# Patient Record
Sex: Female | Born: 1935 | Race: White | Hispanic: No | Marital: Married | Smoking: Current every day smoker
Health system: Southern US, Community
[De-identification: ages and names within clinical notes are randomized; demographics above are authoritative.]

## PROBLEM LIST (undated history)

## (undated) DIAGNOSIS — I639 Cerebral infarction, unspecified: Secondary | ICD-10-CM

## (undated) DIAGNOSIS — E785 Hyperlipidemia, unspecified: Secondary | ICD-10-CM

## (undated) DIAGNOSIS — R55 Syncope and collapse: Secondary | ICD-10-CM

## (undated) DIAGNOSIS — C801 Malignant (primary) neoplasm, unspecified: Secondary | ICD-10-CM

## (undated) DIAGNOSIS — Z720 Tobacco use: Secondary | ICD-10-CM

## (undated) DIAGNOSIS — Z7901 Long term (current) use of anticoagulants: Secondary | ICD-10-CM

## (undated) DIAGNOSIS — R079 Chest pain, unspecified: Secondary | ICD-10-CM

## (undated) DIAGNOSIS — I6529 Occlusion and stenosis of unspecified carotid artery: Secondary | ICD-10-CM

## (undated) DIAGNOSIS — G629 Polyneuropathy, unspecified: Secondary | ICD-10-CM

## (undated) DIAGNOSIS — I951 Orthostatic hypotension: Secondary | ICD-10-CM

## (undated) DIAGNOSIS — K219 Gastro-esophageal reflux disease without esophagitis: Secondary | ICD-10-CM

## (undated) DIAGNOSIS — I2510 Atherosclerotic heart disease of native coronary artery without angina pectoris: Secondary | ICD-10-CM

## (undated) DIAGNOSIS — I517 Cardiomegaly: Secondary | ICD-10-CM

## (undated) DIAGNOSIS — I10 Essential (primary) hypertension: Secondary | ICD-10-CM

## (undated) DIAGNOSIS — I4891 Unspecified atrial fibrillation: Secondary | ICD-10-CM

## (undated) DIAGNOSIS — I672 Cerebral atherosclerosis: Secondary | ICD-10-CM

## (undated) HISTORY — DX: Syncope and collapse: R55

## (undated) HISTORY — DX: Cerebral atherosclerosis: I67.2

## (undated) HISTORY — DX: Hyperlipidemia, unspecified: E78.5

## (undated) HISTORY — DX: Atherosclerotic heart disease of native coronary artery without angina pectoris: I25.10

## (undated) HISTORY — DX: Chest pain, unspecified: R07.9

## (undated) HISTORY — PX: TONSILLECTOMY: SUR1361

## (undated) HISTORY — PX: SKIN CANCER EXCISION: SHX779

## (undated) HISTORY — PX: CHOLECYSTECTOMY: SHX55

## (undated) HISTORY — PX: TEE WITH CARDIOVERSION: SHX5442

---

## 2003-09-14 ENCOUNTER — Encounter: Payer: Self-pay | Admitting: Orthopedic Surgery

## 2003-12-07 ENCOUNTER — Encounter: Payer: Self-pay | Admitting: Orthopedic Surgery

## 2004-06-10 ENCOUNTER — Emergency Department (HOSPITAL_COMMUNITY): Admission: EM | Admit: 2004-06-10 | Discharge: 2004-06-10 | Payer: Self-pay | Admitting: Emergency Medicine

## 2004-06-18 ENCOUNTER — Emergency Department (HOSPITAL_COMMUNITY): Admission: EM | Admit: 2004-06-18 | Discharge: 2004-06-18 | Payer: Self-pay | Admitting: Emergency Medicine

## 2005-06-22 ENCOUNTER — Ambulatory Visit (HOSPITAL_COMMUNITY): Admission: RE | Admit: 2005-06-22 | Discharge: 2005-06-22 | Payer: Self-pay | Admitting: Family Medicine

## 2005-07-12 ENCOUNTER — Ambulatory Visit (HOSPITAL_COMMUNITY): Admission: RE | Admit: 2005-07-12 | Discharge: 2005-07-12 | Payer: Self-pay | Admitting: Family Medicine

## 2005-07-21 ENCOUNTER — Inpatient Hospital Stay (HOSPITAL_COMMUNITY): Admission: EM | Admit: 2005-07-21 | Discharge: 2005-07-22 | Payer: Self-pay | Admitting: Emergency Medicine

## 2006-06-19 ENCOUNTER — Inpatient Hospital Stay (HOSPITAL_COMMUNITY): Admission: AD | Admit: 2006-06-19 | Discharge: 2006-06-25 | Payer: Self-pay | Admitting: Internal Medicine

## 2006-06-20 ENCOUNTER — Ambulatory Visit: Payer: Self-pay | Admitting: Gastroenterology

## 2006-06-21 ENCOUNTER — Ambulatory Visit: Payer: Self-pay | Admitting: Gastroenterology

## 2006-06-21 ENCOUNTER — Encounter (INDEPENDENT_AMBULATORY_CARE_PROVIDER_SITE_OTHER): Payer: Self-pay | Admitting: Specialist

## 2006-07-20 DIAGNOSIS — I2510 Atherosclerotic heart disease of native coronary artery without angina pectoris: Secondary | ICD-10-CM

## 2006-07-20 HISTORY — DX: Atherosclerotic heart disease of native coronary artery without angina pectoris: I25.10

## 2006-07-30 ENCOUNTER — Ambulatory Visit (HOSPITAL_COMMUNITY): Admission: RE | Admit: 2006-07-30 | Discharge: 2006-07-30 | Payer: Self-pay | Admitting: Internal Medicine

## 2006-08-29 ENCOUNTER — Encounter (INDEPENDENT_AMBULATORY_CARE_PROVIDER_SITE_OTHER): Payer: Self-pay | Admitting: Cardiology

## 2006-08-29 ENCOUNTER — Ambulatory Visit (HOSPITAL_COMMUNITY): Admission: RE | Admit: 2006-08-29 | Discharge: 2006-08-29 | Payer: Self-pay | Admitting: Cardiology

## 2007-03-15 ENCOUNTER — Ambulatory Visit (HOSPITAL_COMMUNITY): Admission: RE | Admit: 2007-03-15 | Discharge: 2007-03-15 | Payer: Self-pay | Admitting: Family Medicine

## 2007-08-06 ENCOUNTER — Ambulatory Visit (HOSPITAL_COMMUNITY): Admission: RE | Admit: 2007-08-06 | Discharge: 2007-08-06 | Payer: Self-pay | Admitting: Internal Medicine

## 2007-08-12 ENCOUNTER — Inpatient Hospital Stay (HOSPITAL_COMMUNITY): Admission: EM | Admit: 2007-08-12 | Discharge: 2007-08-16 | Payer: Self-pay | Admitting: Emergency Medicine

## 2007-08-29 ENCOUNTER — Emergency Department (HOSPITAL_COMMUNITY): Admission: EM | Admit: 2007-08-29 | Discharge: 2007-08-29 | Payer: Self-pay | Admitting: Emergency Medicine

## 2008-06-25 ENCOUNTER — Emergency Department (HOSPITAL_COMMUNITY): Admission: EM | Admit: 2008-06-25 | Discharge: 2008-06-25 | Payer: Self-pay | Admitting: Emergency Medicine

## 2008-08-12 ENCOUNTER — Ambulatory Visit (HOSPITAL_COMMUNITY): Admission: RE | Admit: 2008-08-12 | Discharge: 2008-08-12 | Payer: Self-pay | Admitting: Internal Medicine

## 2008-12-03 IMAGING — CR DG CHEST 2V
2 series · 2 of 2 positions shown · non-contrast
Comparison: CT and plain film 07/21/2005.

CLINICAL DATA: Atrial flutter. Chest pain.

CHEST - 2 VIEW

[view not recorded (1 of 2)]
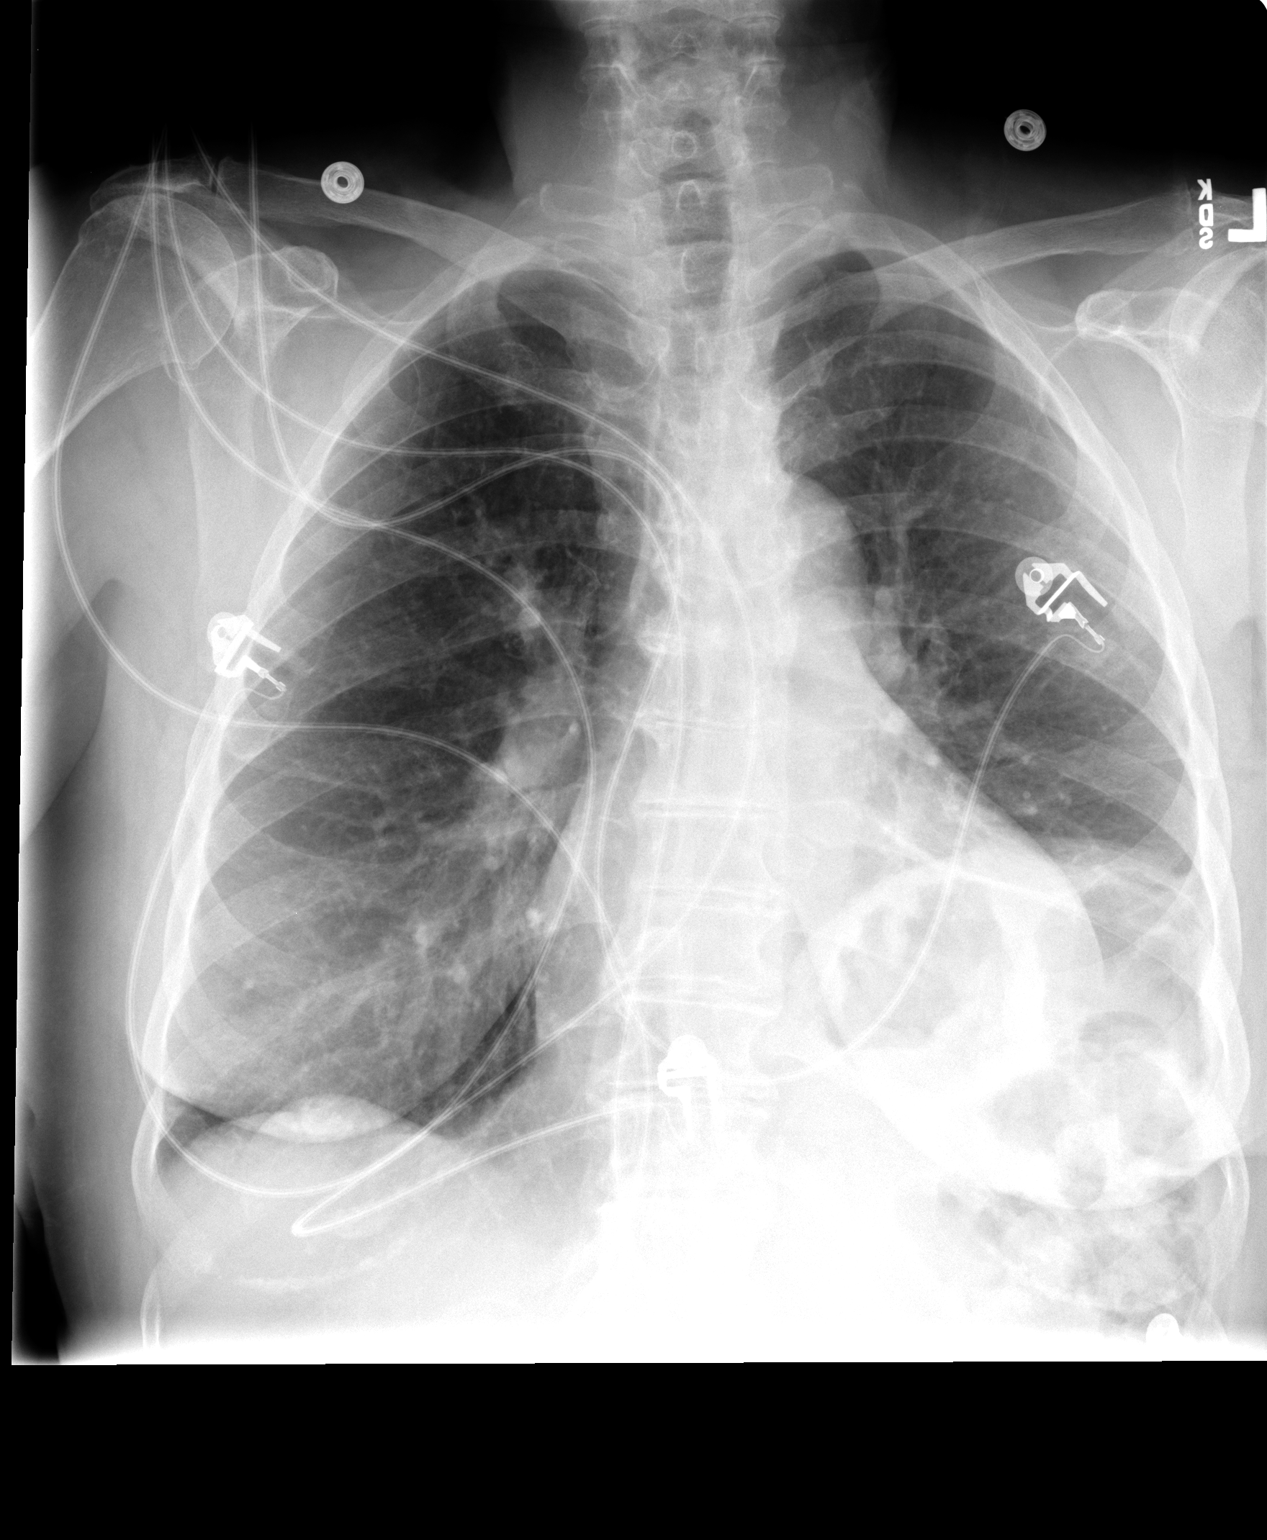

[view not recorded (2 of 2)]
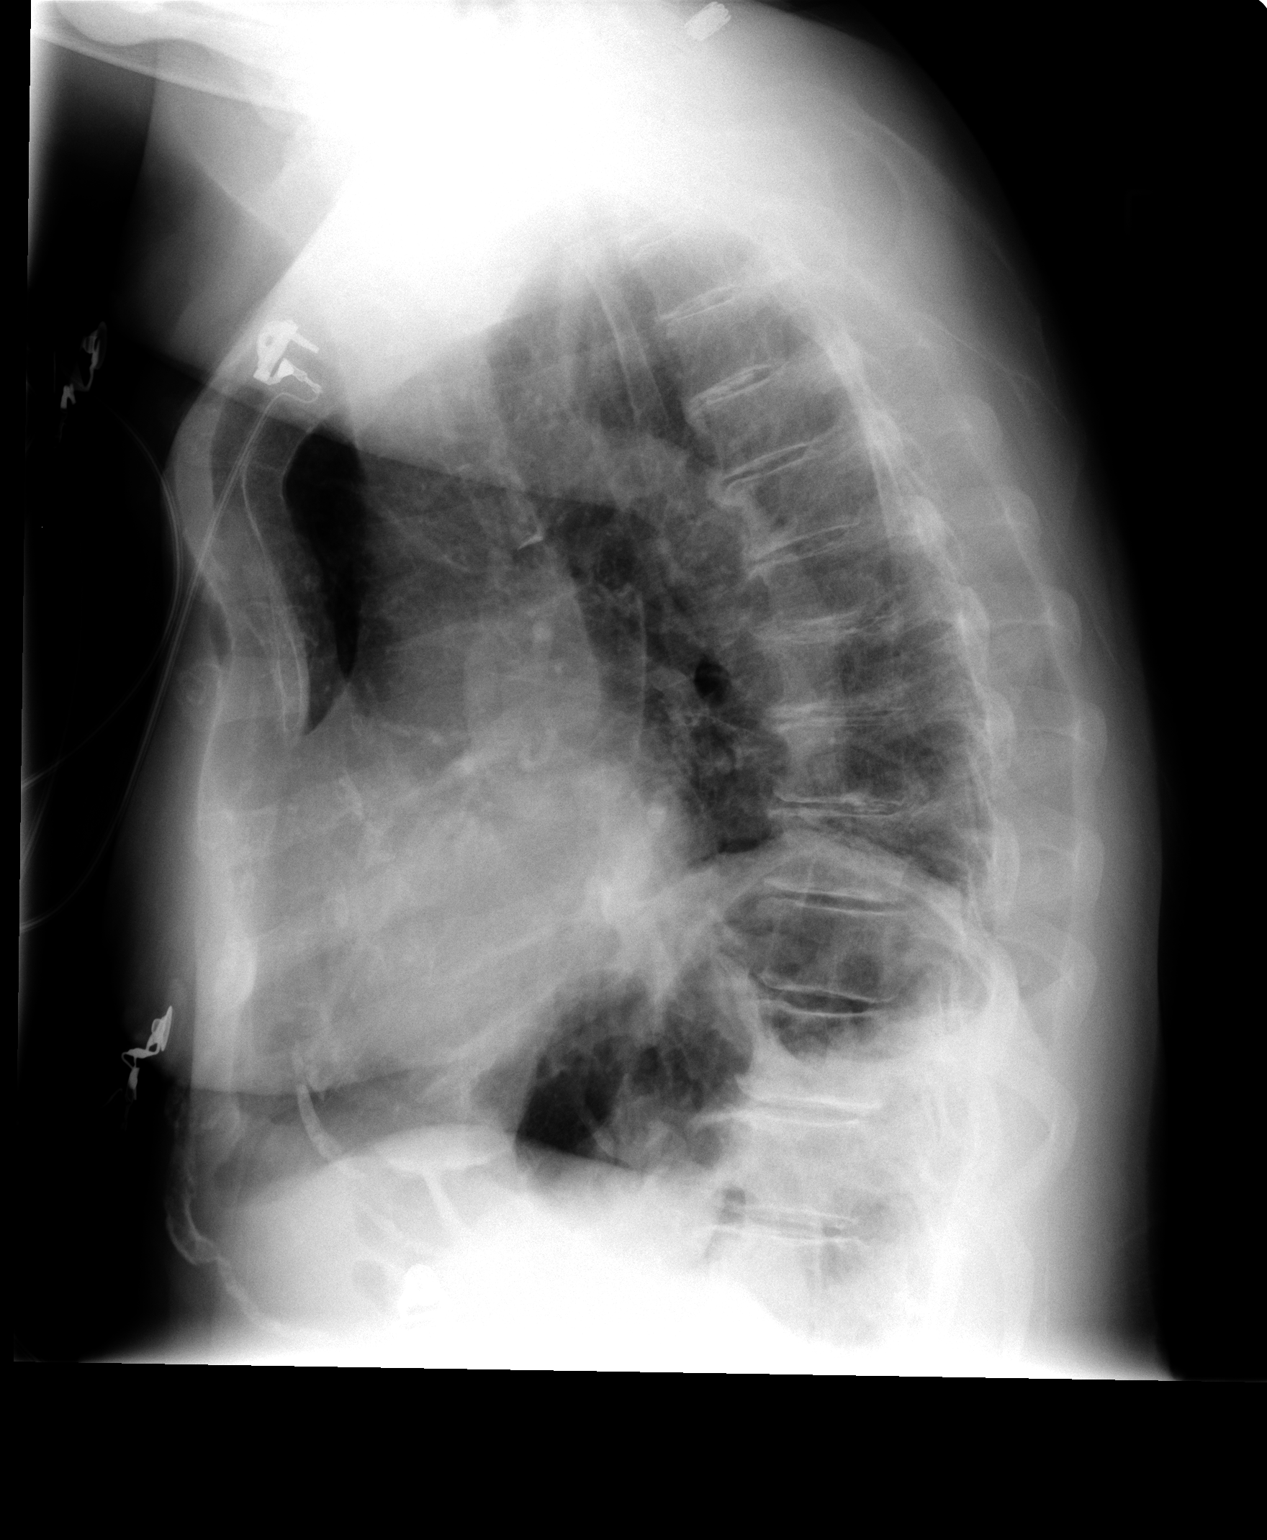

[2 of 2 positions shown; findings below may reference images not displayed]

FINDINGS: Pectus deformity. Probable seventh posterior right rib remote trauma.
S-shaped spinal curvature. Midline trachea. Heart size upper limits of normal.
Normal mediastinal contours. Sharp costophrenic angles. Left hemidiaphragm
moderately elevated, stable.

No pneumothorax. Mild pulmonary venous congestion cannot be excluded. No overt
congestive failure. Minimal left base atelectasis.

IMPRESSION

1. Borderline cardiomegaly without acute cardiopulmonary disease.
2. Persistent left hemidiaphragm elevation.
3. Cannot exclude mild pulmonary venous congestion.

## 2009-04-26 ENCOUNTER — Ambulatory Visit (HOSPITAL_COMMUNITY): Admission: RE | Admit: 2009-04-26 | Discharge: 2009-04-26 | Payer: Self-pay | Admitting: Cardiovascular Disease

## 2009-08-16 ENCOUNTER — Ambulatory Visit (HOSPITAL_COMMUNITY): Admission: RE | Admit: 2009-08-16 | Discharge: 2009-08-16 | Payer: Self-pay | Admitting: Internal Medicine

## 2009-08-26 ENCOUNTER — Ambulatory Visit (HOSPITAL_COMMUNITY): Admission: RE | Admit: 2009-08-26 | Discharge: 2009-08-26 | Payer: Self-pay | Admitting: Ophthalmology

## 2009-11-11 ENCOUNTER — Ambulatory Visit (HOSPITAL_COMMUNITY): Admission: RE | Admit: 2009-11-11 | Discharge: 2009-11-11 | Payer: Self-pay | Admitting: Ophthalmology

## 2009-12-07 ENCOUNTER — Ambulatory Visit (HOSPITAL_COMMUNITY): Admission: RE | Admit: 2009-12-07 | Discharge: 2009-12-07 | Payer: Self-pay | Admitting: Internal Medicine

## 2010-02-18 ENCOUNTER — Encounter: Payer: Self-pay | Admitting: Orthopedic Surgery

## 2010-02-18 ENCOUNTER — Emergency Department (HOSPITAL_COMMUNITY)
Admission: EM | Admit: 2010-02-18 | Discharge: 2010-02-18 | Payer: Self-pay | Source: Home / Self Care | Admitting: Emergency Medicine

## 2010-02-23 ENCOUNTER — Ambulatory Visit
Admission: RE | Admit: 2010-02-23 | Discharge: 2010-02-23 | Payer: Self-pay | Source: Home / Self Care | Attending: Orthopedic Surgery | Admitting: Orthopedic Surgery

## 2010-02-23 ENCOUNTER — Encounter: Payer: Self-pay | Admitting: Orthopedic Surgery

## 2010-02-23 DIAGNOSIS — S8010XA Contusion of unspecified lower leg, initial encounter: Secondary | ICD-10-CM | POA: Insufficient documentation

## 2010-02-23 DIAGNOSIS — S93409A Sprain of unspecified ligament of unspecified ankle, initial encounter: Secondary | ICD-10-CM | POA: Insufficient documentation

## 2010-03-13 ENCOUNTER — Encounter: Payer: Self-pay | Admitting: Family Medicine

## 2010-03-15 ENCOUNTER — Ambulatory Visit
Admission: RE | Admit: 2010-03-15 | Discharge: 2010-03-15 | Payer: Self-pay | Source: Home / Self Care | Attending: Internal Medicine | Admitting: Internal Medicine

## 2010-03-24 NOTE — Progress Notes (Signed)
Summary: Initial Eval 09/14/03  Initial Eval 09/14/03   Imported By: Cammie Sickle 02/23/2010 13:03:42  _____________________________________________________________________  External Attachment:    Type:   Image     Comment:   External Document

## 2010-03-24 NOTE — Progress Notes (Signed)
Summary: Office Visit 12/07/03  Office Visit 12/07/03   Imported By: Cammie Sickle 02/23/2010 13:04:14  _____________________________________________________________________  External Attachment:    Type:   Image     Comment:   External Document

## 2010-03-24 NOTE — Letter (Signed)
Summary: hit[facility] form   hitory form   Imported By: Eugenio Hoes 02/24/2010 14:03:41  _____________________________________________________________________  External Attachment:    Type:   Image     Comment:   External Document

## 2010-03-24 NOTE — Assessment & Plan Note (Signed)
Summary: [facility] FOL/UP/LT ANKLE SPRAIN/XR 02/18/10/[facility]   Vital Signs:  Patient profile:   75 year old female Height:      62 inches Weight:      122 pounds Pulse rate:   82 / minute Resp:     16 per minute  Vitals Entered By: Fuller Canada MD (February 23, 2010 3:15 PM)  Visit Type:  new patient Referring Provider:  [facility] Primary Provider:  Dr. Sherwood Gambler  CC:  left ankle pain.  History of Present Illness: I saw Yolinda Tetrault in the office today for an initial visit.  She is a 75 years old woman with the complaint of:  left ankle pain.  DOI 02/18/10.  Xrays left ankle and tib/fib 02/18/10 [facility].  Norco 5 given from er number 15, other meds to scan.  In wheelchair today.  She does have some neuropathy and just simply fell. Does not complain of pain, but has swelling in the tibial area and around the foot and ankle. She describes dull 3/10 discomfort in the leg and ankle of the LEFT lower extremity associated with bruising along the shin bone and around the heel    Allergies (verified): 1)  ! * Ivp Dye 2)  ! Iodine 3)  ! Macrobid 4)  ! Sulfa 5)  ! Penicillin  Past History:  Past Medical History: neuropathy htn cholesterol acid reflux  Past Surgical History: gallbladder  Family History: na  Social History: Patient is married.  retired smokes 1ppd no alcohol morning coffee 2 yrs of college  Review of Systems Constitutional:  Denies weight loss, weight gain, fever, chills, and fatigue. Cardiovascular:  Denies chest pain, palpitations, fainting, and murmurs. Respiratory:  Complains of wheezing and couch; denies short of breath, tightness, pain on inspiration, and snoring . Gastrointestinal:  Complains of constipation; denies heartburn, nausea, vomiting, diarrhea, and blood in your stools. Genitourinary:  Complains of urgency; denies frequency, difficulty urinating, painful urination, flank pain, and bleeding in urine. Neurologic:  Denies numbness,  tingling, unsteady gait, dizziness, tremors, and seizure. Musculoskeletal:  Complains of joint pain, swelling, and instability; denies stiffness, redness, heat, and muscle pain. Endocrine:  Complains of heat or cold intolerance; denies excessive thirst and exessive urination. Psychiatric:  Denies nervousness, depression, anxiety, and hallucinations. Skin:  Denies changes in the skin, poor healing, rash, itching, and redness. HEENT:  Denies blurred or double vision, eye pain, redness, and watering. Immunology:  Denies seasonal allergies, sinus problems, and allergic to bee stings. Hemoatologic:  Complains of easy bleeding and brusing.  Physical Exam  Additional Exam:  GEN: well developed, well nourished, normal grooming and hygiene, no deformity and normal body habitus.   CDV: pulses are normal, no edema, no erythema. no tenderness  Lymph: normal lymph nodes   Skin: no rashes, skin lesions or open sores   NEURO: normal coordination, reflexes, abnormal sensation.   Psyche: awake, alert and oriented. Mood normal   Gait: currently ambulatory in a wheelchair, and a 3 .walker with wheels.  RIGHT leg has a small bruise over the anterior tibia.  LEFT leg appears to have a supination posture. There is bruising and ecchymosis around the ankle and inferior portion of the foot near the Achilles. However, the [place] test is negative for rupture. Passive range of motion is normal. No weakness in plantar or dorsiflexion.     Impression & Recommendations:  Problem # 1:  ANKLE SPRAIN, LEFT (ICD-845.00) Assessment New  Orders: New Patient Level II (30865)  Problem #  2:  CONTUSION, LOWER LEG, LEFT (ICD-924.10) Assessment: New  Orders: New Patient Level II (16109)  Patient Instructions: 1)  You appear to have a sprained ankle and a contusion  2)  Wear Brace if needed  3)  Please schedule a follow-up appointment as needed.   Orders Added: 1)  New Patient Level II [99202]

## 2010-05-05 LAB — BASIC METABOLIC PANEL
CO2: 25 mEq/L (ref 19–32)
Chloride: 108 mEq/L (ref 96–112)
GFR calc Af Amer: >60 mL/min (ref >60)
Potassium: 4.1 mEq/L (ref 3.5–5.1)
Sodium: 140 mEq/L (ref 135–145)

## 2010-05-05 LAB — HEMOGLOBIN AND HEMATOCRIT, BLOOD
HCT: 41 % (ref 36.0–46.0)
Hemoglobin: 13.9 g/dL (ref 12.0–15.0)

## 2010-05-08 LAB — HEMOGLOBIN AND HEMATOCRIT, BLOOD
HCT: 40.6 % (ref 36.0–46.0)
Hemoglobin: 13.8 g/dL (ref 12.0–15.0)

## 2010-05-08 LAB — BASIC METABOLIC PANEL
BUN: 6 mg/dL (ref 6–23)
GFR calc Af Amer: >60 mL/min (ref >60)
GFR calc non Af Amer: >60 mL/min (ref >60)
Potassium: 3.6 mEq/L (ref 3.5–5.1)
Sodium: 137 mEq/L (ref 135–145)

## 2010-07-05 NOTE — H&P (Signed)
NAMESHEQUILLA, Andrea [name]            ACCOUNT NO.:  [id number]   MEDICAL RECORD NO.:  [id number]          PATIENT TYPE:  INP   LOCATION:  A315                          FACILITY:  [facility]   PHYSICIAN:  Gardiner Barefoot, MD    DATE OF BIRTH:  05-03-1935   DATE OF ADMISSION:  08/12/2007  DATE OF DISCHARGE:  LH                              HISTORY & PHYSICAL   PRIMARY CARE PHYSICIAN:  Madelin Rear. Sherwood Gambler, MD   CHIEF COMPLAINT:  Weakness.   HISTORY OF PRESENT ILLNESS:  This is a 75 year old female with a history  of CAD and atrial fibrillation, status post cardioversion last year, who  presents here with weakness for about 2 or 3 days.  The patient is  confused and is a difficult historian at this time.  She does report  some nausea and vomiting, mainly weakness and fatigue.  She does seem to  have some expressive aphasia.  No report of any fever.   PAST MEDICAL HISTORY:  1. Hypertension.  2. GERD.  3. Mitral valve prolapse.  4. Myocardial infarction, status post stent in [place] in 1990s.  5. Peripheral neuropathy.  6. Atrial fibrillation, status post cardioversion.   MEDICATIONS:  1. Aciphex 40 mg p.o. daily.  2. Benazepril 20 mg p.o. daily.  3. Cipro 500 mg b.i.d.  4. Coumadin 5 mg daily.  5. Estazolam 2 mg p.o. daily.  6. Isosorbide mononitrate 30 mg daily.  7. Lipitor 10 mg daily.  8. Metoprolol 12.5 mg p.o. daily.  9. Niacin 500 mg daily.  10.Nitro-Bid TD 12 hours on and 12 hours off daily.  11.Hydrochlorothiazide 25 mg daily.   ALLERGIES:  1. PENICILLIN.  2. SULFA.  3. IV CONTRAST DYE.  4. MACROBID.   SOCIAL HISTORY:  The patient has abused tobacco.   FAMILY HISTORY:  Unobtainable.   REVIEW OF SYSTEMS:  Unobtainable.   PHYSICAL EXAMINATION:  VITAL SIGNS:  Pulse 72-101, BP is 133 to 146 over  60 to 74, and respirations 16, and O2 sat is 93%.  GENERAL:  The patient is awake and alert, and confused, appears in no  acute distress.  CARDIOVASCULAR:  Regular rate and  rhythm.  No murmurs, rubs, or gallops.  LUNGS:  Clear to auscultation bilaterally.  ABDOMEN:  Soft, nontender, and nondistended with positive bowel sounds.  No hepatosplenomegaly.  EXTREMITIES:  Without edema.  NEURO:  With confusion and some expressive aphasia.   LABORATORY DATA:  Sodium 107, potassium 2.6, chloride 69, bicarb 28, BUN  3, creatinine 0.42, glucose 119, WBC is 9.0, hemoglobin 14, and  platelets 230.   ASSESSMENT AND PLAN:  A 75 year old female with electrolyte  abnormalities and confusion.  1. I will check the patient's plasma and urine osmolality for question      of SIADH, we will start normal 3% saline at 50 mL an hour and      replace her potassium IV.  We will follow her electrolytes every 4      hours.  2. Confusion.  She does seem to have a little expressive aphasia and      there is  a report of history of cerebrovascular accident.      Therefore we will check her CT of her head.  3. Hypertension.  Will hold her on her hydrochlorothiazide at this      time as this can be a cause of SIADH.  4. Psychiatric medications.  We will hold on estazolam at this time      with her confusion.      Gardiner Barefoot, MD  Electronically Signed     RWC/MEDQ  D:  08/12/2007  T:  08/12/2007  Job:  [id number]

## 2010-07-05 NOTE — Discharge Summary (Signed)
[name], [name]            ACCOUNT NO.:  [id number]   MEDICAL RECORD NO.:  [id number]          PATIENT TYPE:  INP   LOCATION:  A317                          FACILITY:  [facility]   PHYSICIAN:  Dorris Singh, DO    DATE OF BIRTH:  05/01/1935   DATE OF ADMISSION:  08/12/2007  DATE OF DISCHARGE:  06/26/2009LH                               DISCHARGE SUMMARY   PRIMARY CARE PHYSICIAN:  Madelin Rear. Sherwood Gambler, MD   ADMISSION DIAGNOSES:  1. Altered mental status.  2. Hyponatremia.  3. Confusion.  4. Hypertension.  5. Psychiatric medication, see H and P.   DISCHARGE DIAGNOSES:  1. Hyponatremia, resolved.  2. Encephalopathy due to hyponatremia, resolved.  3. Hypertension, stable.   RADIOLOGY RESULTS:  On June 22 she had CT of the head without contrast  which showed no acute intracranial findings and then on June 24 she had  an MRI of brain which demonstrated no acute intracranial abnormality,  moderate to mild chronic small vessel disease for age.  Her H and P was  done by Dr. Luciana Axe, please refer.  With the above diagnoses she was  admitted to the service of [facility].  She was hydrated cautiously for  the correction of her sodium, also her diuretic was stopped as well.  She continued to improve.  Her mental status improved as well as her  slurred speech.  It was determined that the patient did not have a CVA.  As her sodium improved so did her confusion and mental status.  She was  seen by Dr. Gerilyn Pilgrim who recommended some blood work for her as well.  As she improved he felt that her altered mental status was possibly due  to encephalopathy due to her hyponatremia.  I spoke extensively with the  patient's power of attorney who is her daughter, Andrea [name],  regarding the patient's care and at this point in time it was determined  that she could be discharged to home.  We will also have home health  care come and evaluate her for at least a week, checking her sodium to  make sure that  it does stay within a normal limit.  The patient's  condition today is stable.  All of her lab work is within normal limits  and she is eligible to be discharged at this point in time.   DISCHARGE MEDICATIONS:  1. She will be sent home on Coumadin 5 mg p.o. daily.  2. Caltrate 600 mg plus C p.o. daily.  3. Aciphex 40 mg p.o. daily.  4. Metoprolol 12.5 mg p.o. b.i.d.  5. HCTZ.  This is an original medication and she will be discontinued      from that.  6. Benazepril 20 mg p.o. daily.  7. Isosorbide 30 mg p.o. daily.  8. Lipitor 10 mg p.o. daily.  9. Niacin 500 mg p.o. daily.  10.Estazolam 0.2 mg p.o. daily.   DISCHARGE INSTRUCTIONS:  Her discharge instructions are as follows:  Recommend a low-sodium diet with increased activity slowly.  Will have  her see Dr. Sherwood Gambler in 2 days which will be Monday and we are  establishing  an appointment for this point in time.  While the patient was getting  her last medications she ended up having an allergic reaction to the  Lovenox with redness and flushing and itching.  Will go ahead and give  her some Benadryl and recommend that she take Benadryl p.o.  All of her  vitals at this point in time are stable.  Also, the patient's blood  pressure has been fluctuating while she has been here and we will go  ahead and increase her metoprolol to 12.5 mg p.o. b.i.d. and this can be  also adjusted by her primary care physician as well.  The patient was  told that if symptoms worsen she can go back to Dr. Gerilyn Pilgrim if need be  but she is recommended to follow up with her primary care physician.      Dorris Singh, DO  Electronically Signed     CB/MEDQ  D:  08/16/2007  T:  08/16/2007  Job:  [id number]   cc:   Madelin Rear. Sherwood Gambler, MD  Fax: [phone]

## 2010-07-05 NOTE — Consult Note (Signed)
Andrea [name], Andrea [name]            ACCOUNT NO.:  [id number]   MEDICAL RECORD NO.:  [id number]          PATIENT TYPE:  INP   LOCATION:  A317                          FACILITY:  [facility]   PHYSICIAN:  Kofi A. Gerilyn Pilgrim, M.D. DATE OF BIRTH:  11/19/1935   DATE OF CONSULTATION:  08/14/2007  DATE OF DISCHARGE:                                 CONSULTATION   REASON FOR CONSULTATION:  Confusion and memory loss.   HISTORY OF PRESENT ILLNESS:  The patient is a 75 year old white female  who presented to the hospital with 2-3 day history of weakness.  She  also had some nausea with vomiting.  She was confused, and on initial  evaluation was a poor historian.  She was noted to have significant  hyponatremia of 107.  She also had other bowel or bladder derangements  including low chloride of 69 and hypokalemia of 2.6.  The patient's  sodium was corrected using 3% normal saline.  She went from a level of  107 at 0300 on August 12, 2007 to 125 at 1955.  She reports she had been a  lot better today, but complains of having lapses in memory.  She also  reports feeling sore all over.   PAST MEDICAL HISTORY:  Significant for peripheral neuropathy diagnosed  in her 40s.  She has been worked up by group in [place].  The exact  etiology is unclear, but she appears to have fluctuation of symptoms.  History of hypertension, mitral valve collapse, mitral valve infarction  status post stents placement in [place] in 1990, history of atrial  fibrillation status post cardioversion.   ADMISSION MEDICATIONS:  Aciphex, benazepril, Cipro, Coumadin,  isosorbide, nitrite, Lipitor, metoprolol, hydrochlorothiazide, and  estazolam.   ALLERGIES:  Penicillin, sulphur IV, contrast dye, and microbid.   SOCIAL HISTORY:  She does use tobacco.   PAST SURGICAL HISTORY:  Cholecystectomy and right breast procedure, what  appears to be a lumpectomy.   FAMILY HISTORY:  She does not report any family history of neuropathy.   PHYSICAL EXAMINATION:  GENERAL:  A thin, pleasant lady in no acute  distress.  VITAL SIGNS:  Temperature 98.1, pulse 82, respirations 20, and blood  pressure 163/85.  HEENT:  Neck is supple.  Head is normocephalic and atraumatic.  ABDOMEN:  Soft.  EXTREMITIES:  No significant edema or varicosities.  MENTATION:  The patient is awake and alert.  She can dress as well.  She  is currently lucid and coherent.  CRANIAL NERVES:  Pupils are equal, round, and reactive to light.  Her  visual fields are intact.  Extraocular movements are full.  Fascial  muscle strength is symmetric.  Tongue is midline.  Uvula midline.  MOTOR:  Bilateral foot drop.  She has atrophy of the tibialis anterior  muscles bilaterally.  She also had atrophy of the first dorsal  interossei muscles in the upper extremities with a claw-hand appearance  of the right hand.  She has associated weakness in those muscles.  There  is some proximal muscle weakness about 4/5.  Coordination shows no  dysmetria, past-pointing, or parkinsonism.  Reflexes are markedly  diminished in the legs.  Plantar reflexes are both downgoing.  Admission  head CT scan shows stable paraventricular and deep white matter changes,  but nothing acute.  TSH 1.43.   LABORATORY DATA:  Admission labs on August 12, 2007 at 3.a.m., sodium 107,  potassium 2.6, chloride 609, CO2 28, BUN 3, creatinine 0.4, glucose 119,  and calcium 9.0.  The chemistries were repeated at 1955, and the sodium  was 125, potassium 5.5, chloride 93, CO2 24, BUN 4, creatinine 0.5, and  glucose 108.  Recent potassium today is 3.3 and sodium 135.  WBC 9,  hemoglobin 14.8, and platelet count 230.   ASSESSMENT:  1. Toxic metabolic encephalopathy, which has improved.  2. Mild memory impairment possibly due to the metabolic derangement.      We will have to screen for other causes of memory impairment.  3. Longstanding history of peripheral neuropathy, unclear etiology.   RECOMMENDATIONS:   1. MRI of the brain.  2. Typical labs for memory impairment.      Kofi A. Gerilyn Pilgrim, M.D.  Electronically Signed     KAD/MEDQ  D:  08/14/2007  T:  08/15/2007  Job:  [id number]

## 2010-07-05 NOTE — Group Therapy Note (Signed)
Andrea [name], Andrea [name]            ACCOUNT NO.:  [id number]   MEDICAL RECORD NO.:  [id number]          PATIENT TYPE:  INP   LOCATION:  A317                          FACILITY:  [facility]   PHYSICIAN:  Kofi A. Gerilyn Pilgrim, M.D. DATE OF BIRTH:  08/08/1935   DATE OF PROCEDURE:  DATE OF DISCHARGE:                                 PROGRESS NOTE   The patient reports that she is feeling a lot better and wants to go  home. She has visited with her children today, and this has helped her.  She still has some slurring of her speech sometimes, but overall, she  has improved significantly. She also feels a lot better.   PHYSICAL EXAMINATION:  She is awake, alert, converses well. She is lucid  and coherent. I see no evidence of dysphagia or aphasia.  Temperature 97.6, respirations 18, pulse 73, blood pressure 124/51.   MRI is reviewed in person and shows nothing acute on diffusion imaging.  There are a few small deep white matter hypertense lesions on FLAIR  imaging and also mild confluent hyperintense areas on the posterior  horns of the lateral ventricles, otherwise looks relatively unremarkable  given her age.   Labs reveal a sed rate of 17, vitamin B12 343, homocysteine 23.7, RPR  nonreactive.   ASSESSMENT:  Resolving metabolic encephalopathy.  I expect the patient  return to baseline, though it may take up to 2 weeks. I would also  recommend folic acid for elevated homocysteine level.      Kofi A. Gerilyn Pilgrim, M.D.  Electronically Signed     KAD/MEDQ  D:  08/15/2007  T:  08/15/2007  Job:  [id number]

## 2010-07-05 NOTE — Group Therapy Note (Signed)
Andrea [name], Andrea [name]            ACCOUNT NO.:  [id number]   MEDICAL RECORD NO.:  [id number]          PATIENT TYPE:  INP   LOCATION:  IC07                          FACILITY:  [facility]   PHYSICIAN:  Osvaldo Shipper, MD     DATE OF BIRTH:  08/23/1935   DATE OF PROCEDURE:  08/13/2007  DATE OF DISCHARGE:                                 PROGRESS NOTE   PRIMARY CARE PHYSICIAN:  Madelin Rear. Sherwood Gambler, MD.   OBJECTIVE:  The patient somnolent this morning.  She was easily  arousable.  She denied any complaints.   OBJECTIVE:  Overnight, the patient got agitated and required a dose of  Haldol with which she calms down.  She has remained afebrile.  Temperature 98.1, heart rate in the 60s and 70s, respiratory rate 16,  blood pressure last reading here is 155/112, that was at 6:00 a.m.  Her  saturation 98% on 2 liters.  In's and out's, she was negative yesterday  by 550 mL, she had a 1700 mL of urine output yesterday.   LUNGS:  Clear to auscultation bilaterally anteriorly.  CARDIOVASCULAR:  S1 and S2 is normal and regular.  No murmurs  appreciated.  Telemetry data reviewed and the heart rates have generally  been above 60.  Yesterday she had heart rates in the 40s which had  prompted transferred to the ICU.  ABDOMEN:  Soft, nontender, nondistended.  NEUROLOGIC:  She is alert and somnolent but easily arousable.  She is  oriented to place and year, not year to month and date. No focal  deficits.   LABORATORY DATA:  Her CBC is completely normal.  Her INR today is 1.7.  Sodium has come up to 130, potassium is 4.4, chloride is 97, bicarb is  25, glucose 94, BUN is 5, creatinine 0.5.  TSH was 1.43.  Serum  osmolality 223.  Urine osmolality still pending.   EKG done yesterday computer read it as atrial fibrillation, however, I  think sinus rhythm.  She does have a known history of atrial  fibrillation.   ASSESSMENT/PLAN:  1. Severe hyponatremia and hypokalemia.  This is because of the  hydrochlorothiazide that she was prescribed 2 weeks ago for lower      extremity edema.  The patient had to be given hypertonic saline      because of altered mental status.  Correction has been a little bit      more rapid than what we would have liked in this patient.  However,      she has improved.  I have told her that she should not take the      hydrochlorothiazide anymore, at least for now and if she is      initiated on a diuretic, close monitoring should be done.  2. Bradycardia seems to have resolved.  We can restart all of her      medications.  3. Hypertension.  Restart her medications.  4. History of coronary artery disease, atrial fibrillation and mitral      valve prolapse.  Also quite stable at this time.  We will restart  her medications.  5. History of gastroesophageal reflux disease is also stable.  6. Her urine output was in the bit more than normal yesterday.  We      will continue to monitor this today.   PT evaluation will be done.  She will be put out of bed to chair.  She  should be reoriented multiple times during the day.  Because of ICU stay  and hospital stay, she is obviously confused.  Diet will be advanced.  I  will check another BMET at 4:00 p.m. and then one tomorrow morning.  I  think if she continues to improve this way, she may be able to go home  in the next day or two.  We will attempt transfer her out of the unit  later today.      Osvaldo Shipper, MD  Electronically Signed     GK/MEDQ  D:  08/13/2007  T:  08/13/2007  Job:  [id number]   cc:   Madelin Rear. Sherwood Gambler, MD  Fax: [phone]

## 2010-07-05 NOTE — Group Therapy Note (Signed)
Andrea [name], Andrea [name]            ACCOUNT NO.:  [id number]   MEDICAL RECORD NO.:  [id number]          PATIENT TYPE:  INP   LOCATION:  A317                          FACILITY:  [facility]   PHYSICIAN:  Dorris Singh, DO    DATE OF BIRTH:  06/24/1935   DATE OF PROCEDURE:  08/14/2007  DATE OF DISCHARGE:                                 PROGRESS NOTE   Patient was seen today.  I spoke with husband, as well as power of  attorney, Andrea [name], regarding patient's care.  Also spoke with Dr.  Gerilyn Pilgrim.  Her sodium has been corrected.  Patient is still, per family,  having some slurred speech.  However, we will continue to monitor.  She  also has an MRI ordered.   VITAL SIGNS TODAY:  Temperature 98.1, pulse 90, respirations 18, blood  pressure 168/88.  GENERALLY:  Patient is a 75 year old Caucasian female, who is well-  developed, well-nourished, in no acute distress.  LUNGS:  Clear to auscultation bilaterally.  HEART:  Regular rate and rhythm.  ABDOMEN:  Soft, nontender, nondistended.  Patient is alert and oriented.  She has some tremors while speaking.   LABORATORIES TODAY:  Her INR is 1.9.  sodium is 135.  This has been  corrected with an original result of 108.  Potassium is 3.3, chloride is  105, CO2 is 25, glucose 81, BUN 3 and creatinine 0.47.   ASSESSMENT AND PLAN:  1. Severe hyponatremia and hypokalemia.  Patient's sodium has been      corrected.  We will also give her oral potassium and we will check      her magnesium levels.  Dr. Gerilyn Pilgrim is on the case and she has an      MRI scheduled, as well as several labs per his recommendation.  2. Bradycardia.  Patient seems to be doing well.  3. Hypertension.  She has been restarted on her medications.  4. History of coronary artery disease, atrial fibrillation, mitral      valve prolapse.  This is stable, as well.  5. History of GERD.  Patient is on Protonix.  We will continue to      monitor her.   I spoke extensively with her  power of attorney regarding care.  We will  await results from MRI and make possible recommendations at that point  in time.      Dorris Singh, DO  Electronically Signed     CB/MEDQ  D:  08/14/2007  T:  08/14/2007  Job:  [id number]

## 2010-07-05 NOTE — Op Note (Signed)
[name], [name]            ACCOUNT NO.:  [id number]   MEDICAL RECORD NO.:  [id number]          PATIENT TYPE:  OIB   LOCATION:  2899                         FACILITY:  [facility]   PHYSICIAN:  Cristy Hilts. Jacinto Halim, MD       DATE OF BIRTH:  [birth year]   DATE OF PROCEDURE:  08/29/2006  DATE OF DISCHARGE:  08/29/2006                               OPERATIVE REPORT   PROCEDURE PERFORMED:  Transesophageal echocardiogram guided electrical  cardioversion.   INDICATION:  Ms. Andrea [name] is a 75 year old female who was  diagnosed with new onset of atrial flutter/atrial fibrillation.  She is  now well anticoagulated for approximately four weeks.  She was brought  for a TEE guided electrical cardioversion.   TECHNIQUE:  After excluding left atrial appendage and left atrial clot  by means of TEE, a synchronized direct current cardioversion was  performed using/delivering 50 joules of a biphasic electricity using the  biphasic defibrillator.  The atrial flutter was converted to sinus  bradycardia with first-degree A-V block.  The patient tolerated the  procedure.  A total of 125 mg of Pentothal was administered for  achieving deep sedation.      Cristy Hilts. Jacinto Halim, MD  Electronically Signed     JRG/MEDQ  D:  08/29/2006  T:  08/30/2006  Job:  [id number]   cc:   Madelin Rear. Sherwood Gambler, MD  Dani Gobble, MD

## 2010-07-08 NOTE — H&P (Signed)
Andrea [name], Andrea [name]            ACCOUNT NO.:  [id number]   MEDICAL RECORD NO.:  [id number]          PATIENT TYPE:  INP   LOCATION:  A217                          FACILITY:  [facility]   PHYSICIAN:  Madelin Rear. Sherwood Gambler, MD  DATE OF BIRTH:  1935-03-16   DATE OF ADMISSION:  07/21/2005  DATE OF DISCHARGE:  LH                                HISTORY & PHYSICAL   CHIEF COMPLAINT:  Chest pain and syncope.   HISTORY OF PRESENT ILLNESS:  Patient was in her usual state of health until  about a day or two, developed a viral gastroenteritis type symptom, complex,  complaining of vague nausea and several loose bowel movements.  She had no  vomiting, no hematemesis, hematochezia or melena, denied any abdominal pain.  She has had mild to moderate headache associated with this.  There were no  neurologic symptoms.  On the day of admission, she was awakened, which she  thought was a dream, at 0500.  She experienced severe, sharp, stabbing,  central chest pain radiating to the intrascapular area.  She has had  intermittent pain in the past of bilateral hand tingling and arm pain in the  left side.  She had no further episodes of chest pain, however, this  persisted and her husband noted her to turn very pale and subsequently have  a true syncopal episode without seizure activity.  She was diaphoretic and  cool.  She states at the time of her onset of chest pain, she had to sit up  because she felt short of breath.  These symptoms spontaneously remitted and  she is back to baseline with no discomfort at all at present.  She denied  any cough, fever, sputum, no hemoptysis.   PAST MEDICAL HISTORY:  1.  Hypertension.  2.  Coronary artery disease by her description, status post MI in the 75s      in [place].  No local cardiologist.  3.  Positive gastroesophageal reflux disease maintained on proton pump      inhibitor.  4.  History of mitral valve prolapse.  5.  Peripheral neuropathy, nondiabetic.   SOCIAL HISTORY:  Positive for cigarette smoking.  Negative for alcohol or  other drug use.  She is married and lives with her husband.   FAMILY HISTORY:  Noncontributory for this illness.   REVIEW OF SYSTEMS:  As under HPI.  All else is negative in detail except she  has had some increasing gastroesophageal reflux symptoms recently.   PHYSICAL EXAMINATION:  GENERAL APPEARANCE:  She is awake, alert,  cooperative.  HEENT:  Head and neck showed JVD or adenopathy.  NECK:  Supple.  CHEST:  Clear.  CARDIOVASCULAR:  Regular rhythm without gallop or rub.  She has a notable  pectus excavatum.  ABDOMEN:  Soft, no organomegaly or masses.  EXTREMITIES:  Without clubbing, cyanosis, or edema.  Distal pulses and  bilateral dorsalis pedis are 2+ bilaterally.  There is no edema.  NEUROLOGIC:  Nonfocal.   EKG reveals sinus bradycardia with occasional PAC.  She has an  interventricular conduction delay with repolarization changes and  nonspecific ST  and T-wave changes.  There is no current of injury or  ischemic or infarctive pattern.   Her electrolytes revealed hyponatremia at 128 and mild to moderate  hypokalemia at 3.1.  Notably she has normal renal function.  Her Beta  natriuretic peptide is negative at less than 30.  CBC was unrevealing.  Initial cardiac markers were negative including a troponin.   Chest x-ray will be reviewed when available on screen but told it was  normal.   IMPRESSION:  1.  Chest pain associated with syncope.  Differential of course includes      esophageal spasm.  Will admit her for observation and serial enzymes.      Will continue her on proton pump inhibitor.  I am going to add a D-      dimer, especially with the interscapular pain, I think we should do a CT      scan of her chest.  She, however, is allergic to dye so she will have to      be prepped for that.  For now, we will do a plain CT just to rule out a      dissection without contrast.  If her D-dimer is  elevated, will pretreat      and do a formal vascular spiral CT to rule out a pulmonary embolus.  2.  Hyponatremia.  Gentle hydration with normal saline and follow-up.  Will      also check her thyroid function.  This is undoubtedly diuretic related.  3.  Hypokalemia.  Supplement orally.  Serial assessment of lytes.  4.  Hypertension. Continue her outpatient regimen.  She is bradycardic but      she is not on any beta-blockers.  Will monitor for any further episodes      of syncope or dysrhythmia and adjust her medications as indicated.      Cardiology will be consulted as well.      Madelin Rear. Sherwood Gambler, MD  Electronically Signed     LJF/MEDQ  D:  07/21/2005  T:  07/21/2005  Job:  [id number]

## 2010-07-08 NOTE — Op Note (Signed)
Andrea [name]            ACCOUNT NO.:  [id number]   MEDICAL RECORD NO.:  [id number]          PATIENT TYPE:  INP   LOCATION:  A219                          FACILITY:  [facility]   PHYSICIAN:  Andrea [name], M.D.      DATE OF BIRTH:  November 29, 1935   DATE OF PROCEDURE:  06/21/2006  DATE OF DISCHARGE:                                PROCEDURE NOTE   PROCEDURE:  Colonoscopy with cold forceps polypectomy.   INDICATION FOR EXAM:  Andrea [name] is a 75 year old female who presented  to the hospital with atrial flutter and was started on anticoagulation.  She subsequently developed bright red blood per rectum without a change  in her hemoglobin.  She has never had a colonoscopy.  She has average  risk for developing colon cancer.   FINDINGS:  1. Melanosis coli.  Multiple significant diverticula.  Many were large      mouth.  She had a 6-mm sigmoid colon polyp which was removed via      cold forceps.  2. Moderate internal hemorrhoids which are the likely source of her      hematochezia.  Otherwise normal retroflexion via of the rectum.   RECOMMENDATIONS:  1. May restart aspirin and heparin in 3 days.  Long-term      anticoagulation with Coumadin is reasonable.  2. Begin Anusol-HC per rectum twice daily and continue for 14 days.      If her rectal bleeding continues then would obtain Surgery consult      for definitive management for her hemorrhoids.  3. Begin fiber-restricted diet for 3 days and then high fiber, low fat      diet.  4. Screening colonoscopy in 5 years if her polyps are adenomatous.  If      her polyps are adenomatous, then her children and siblings should      have a screening colonoscopy beginning at age 23 and then every 5      years.   MEDICATIONS:  1. Demerol 75 mg IV.  2. Versed 3 mg IV.   PROCEDURE TECHNIQUE:  Physical exam was performed.  Informed consent was  obtained from the patient after explaining the benefits, risks and  alternatives to the procedure.  The  patient was placed on a monitor and  placed in the left lateral position.  Continuous oxygen was provided by  nasal cannula.  IV medications were administered through an indwelling  cannula.  After the administration of sedation and rectal exam, the  patient's rectum was  intubated and the scope was advanced under direct visualization to the  cecum.  The scope was subsequently removed by carefully examining the  color, texture, anatomy and integrity of the mucosa on the way out.  The  patient weas recovered in endocscopy and discharged to the floor in  satisfactory condition.      Andrea [name], M.D.  Electronically Signed     SM/MEDQ  D:  06/21/2006  T:  06/21/2006  Job:  [id number]   cc:   Andrea Rear. Andrea Gambler, MD  Fax: [phone]

## 2010-07-08 NOTE — Discharge Summary (Signed)
[name], Andrea [name]            ACCOUNT NO.:  [id number]   MEDICAL RECORD NO.:  [id number]          PATIENT TYPE:  INP   LOCATION:  A217                          FACILITY:  [facility]   PHYSICIAN:  Madelin Rear. Sherwood Gambler, MD  DATE OF BIRTH:  17-Sep-1935   DATE OF ADMISSION:  07/21/2005  DATE OF DISCHARGE:  06/02/2007LH                                 DISCHARGE SUMMARY   DISCHARGE DIAGNOSES:  1.  Chest pain, question unstable angina versus esophageal spasm.  2.  Hypertension, chronic and well controlled.  3.  Gastroesophageal reflux disease with as above questionable at esophageal      spasm.  4.  Hyponatremia.  5.  Hypokalemia.   DISCHARGE MEDICATIONS:  1.  Aspirin 81 mg p.o. daily.  2.  Estazolam 2 mg p.o. q.h.s. p.r.n.  3.  Spironolactone 25 mg p.o. q.i.d.  4.  Norvasc 5 mg p.o. daily.  5.  AcipHex 20 mg p.o. b.i.d.   SUMMARY:  The patient was admitted with severe chest discomfort associated  with syncope.  She was monitored and had no further document of  dysrhythmias.  Her cardiac enzymes were negative.  She was seen in  consultation by cardiology to place her on anticoagulant therapy.  Serial  enzymes were negative.  She was initially noted to be hyponatremic and  hypokalemic.  IV replacement of fluids, as well as oral supplementation  resulted in correction of her electrolytes abnormalities.  She was  discharged on continuing anti-gastroesophageal reflux disease medications,  continued p.r.n. nitroglycerin patch and will follow up in office after  discontinuation of her Cardura, has a blood pressure check in 1 week, as  well as follow up with Dr. Domingo Sep.      Madelin Rear. Sherwood Gambler, MD  Electronically Signed     LJF/MEDQ  D:  07/22/2005  T:  07/22/2005  Job:  [id number]

## 2010-07-08 NOTE — Discharge Summary (Signed)
[name], Andrea [name]            ACCOUNT NO.:  [id number]   MEDICAL RECORD NO.:  [id number]          PATIENT TYPE:  INP   LOCATION:  A219                          FACILITY:  [facility]   PHYSICIAN:  Madelin Rear. Sherwood Gambler, MD  DATE OF BIRTH:  Jul 12, 1935   DATE OF ADMISSION:  06/19/2006  DATE OF DISCHARGE:  05/05/2008LH                               DISCHARGE SUMMARY   DISCHARGE MEDICATIONS:  1. Lovenox 100 mg subcu daily.  2. Coumadin 10 mg p.o. daily.  3. Cardizem 30 mg p.o. q.i.d.  4. Aspirin 81 mg p.o. daily.  5. AcipHex 20 mg p.o. daily  6. Toprol XL 25 mg p.o. daily.  7. Zocor 20 mg daily p.o. daily.  8. Aldactone 25 mg p.o. daily.   DISCHARGE DIAGNOSES:  1. New onset rapid atrial fibrillation.  2. Acute gastrointestinal hemorrhage secondary to polyp and      hemorrhoidal bleeding secondary to anticoagulation.  3. Hypertension.  4. Gastroesophageal reflux disease.   In summary, the patient was admitted with asymptomatic rapid atrial  fibrillation.  Initial attempts at anticoagulation and anticipation of  cardioversion resulted in complication of a GI bleed.  She was  subsequently colonoscoped after cessation of anticoagulants, found to  have a polyp which was removed, as well as hemorrhoids.  She stopped  bleeding.  Her H and H never dropped, and she remained hemodynamically  and clinically stable.  She was subsequently reintroduced to subcu  Lovenox in addition to oral Coumadin without recurrence of bleeding.  She was discharged with followup in the daily for pro times, in  anticipation of full anticoagulation with Coumadin, and subsequent  cardioversion by cardiology.  This will be arranged as an outpatient.      Madelin Rear. Sherwood Gambler, MD  Electronically Signed     LJF/MEDQ  D:  06/25/2006  T:  06/25/2006  Job:  [id number]

## 2010-07-08 NOTE — Procedures (Signed)
NAMEMIAMI, LATULIPPE            ACCOUNT NO.:  [id number]   MEDICAL RECORD NO.:  [id number]          PATIENT TYPE:  INP   LOCATION:  A219                          FACILITY:  [facility]   PHYSICIAN:  Nicki Guadalajara, M.D.     DATE OF BIRTH:  1935-04-12   DATE OF PROCEDURE:  DATE OF DISCHARGE:                                ECHOCARDIOGRAM   2-D Echo Doppler Study   INDICATIONS:  Ms. Andrea [name] is a 75 year old female with history  of atrial fibrillation/flutter, history of hypertension, chest pain.  She is referred for echo Doppler study.   1. Technically this is an adequate M-mode two-dimensional Doppler      echocardiogram.  2. There was mild concentric left ventricular hypertrophy with septal      wall thickness measuring 1.2 cm.  Left ventricular end-diastolic      and end-systolic dimensions were normal at 4.4 and 3.5 cm,      respectively.  Systolic function was normal with an estimated      ejection fraction of approximately 55%.  There were no focal      segmental wall motion abnormalities.  Diastolic function normal by      mitral valve inflow signal.  3. Left atrium was upper normal in size at 3.9 cm.  Right atrium was      upper normal in size.  4. Aortic root dimension was normal at 2.3 cm.  5. Aortic valve was trileaflet.  There was focal nodular sclerosis      involving the tip of the right coronary cusp.  There was mild      commissural thickening.  Peak instantaneous gradient was 11 mm with      mean gradient of 7 mm compatible with aortic sclerosis/minimal      aortic stenosis.  There was trace to mild aortic insufficiency.  6. There was mild mitral annular calcification.  Mitral valve leaflets      appeared mildly thickened/minimally myxomatous.  There was flat      mitral valve closure, particularly involving the anterior leaflet      without definitive mitral valve prolapse.  There was mild mitral      regurgitation.  7.  There was mild tricuspid  regurgitation.  7. Pulmonic valve was not well seen.  8. There were no intramyocardial masses, thrombi or effusions.   IMPRESSION:  Technically this is an adequate echo Doppler study  demonstrating borderline mild concentric left ventricular hypertrophy  with normal left ventricular systolic and probably normal diastolic  function.  There are upper normal atrial dimensions.  There is a nodular  sclerosis of a trileaflet aortic valve with aortic sclerosis/minimal  stenosis/mild aortic insufficiency.  There is mild mitral annular  calcification with minimally myxomatous mitral valve leaflets with  slight closure without definitive prolapse but with mild mitral  regurgitation.  There is mild tricuspid regurgitation.           ______________________________  Nicki Guadalajara, M.D.     TK/MEDQ  D:  06/21/2006  T:  06/21/2006  Job:  [id number]   cc:   Gerlene Burdock A. Alanda Amass, M.D.  Fax: [phone]   Madelin Rear. Sherwood Gambler, MD  Fax: [phone]

## 2010-07-08 NOTE — H&P (Signed)
Andrea [name], Andrea [name]            ACCOUNT NO.:  [id number]   MEDICAL RECORD NO.:  [id number]          PATIENT TYPE:  INP   LOCATION:  A219                          FACILITY:  [facility]   PHYSICIAN:  Madelin Rear. Sherwood Gambler, MD  DATE OF BIRTH:  [birth year]   DATE OF ADMISSION:  06/19/2006  DATE OF DISCHARGE:  LH                              HISTORY & PHYSICAL   CHIEF COMPLAINT:  Routine office visit.   HISTORY OF PRESENT ILLNESS:  The patient came in for a simple check the  office accompanying her husband who needed some laboratory work done.  She was completely asymptomatic but was noted to have an irregular  pulse.  Further investigation with electrocardiogram confirmed a rapid  atrial fibrillation.  In detail, the patient was reviewed for any  hemodynamic embarrassment or symptoms and she specifically denied any  chest pain, shortness of breath, palpitations, syncope, diaphoretic  spells, nausea.  She has had no cough, sputum, fever, rigors or chills.  She has had no pleuritic chest pain.  She has noted no change in her  bowel or bladder habits.   PAST MEDICAL HISTORY:  1. Hypertension.  2. Previous coronary artery disease by description status post MI in      the 2s in [place].  3. Gastroesophageal reflux disease.  4. Chest pain admission presumed to be secondary to esophageal spasm.  5. Peripheral neuropathy non-diabetic.   SOCIAL HISTORY:  Positive for cigarette smoking.  Married, lives with  her husband.  No alcohol or other drug use.   FAMILY HISTORY:  Noncontributory.   REVIEW OF SYSTEMS:  Detailed review was obtained.  She denied any  headache, blurred vision, double vision, hearing change or tinnitus.  She did endorse one episode one week ago of a weak spell where she  checked her blood pressure and it was actually mildly elevated after a  stressful situation at home.  At that time she did not experience  palpitations or syncope.  She stated it lasted 20 minutes and  spontaneously disappeared and she has felt fine since.  She denied any  swallowing difficulty dysphasia.  PULMONARY/ CARDIAC:  As under HPI.  GI: No hematemesis, hematochezia or melena.  No abdominal pain, no  diarrhea or vomiting.  No increase in reflux symptomatology.  EXTREMITIES/MUSCULOSKELETAL:  She denied any swelling or arthralgia or  arthritis.  She does endorse chronic longstanding peripheral neuropathy  with some gait problems secondary to that. ALLERGY/IMMUNOLOGIC:  She  denied any skin rashes, tick bites or other exposures.  CONSTITUTIONAL:  Her weight has been stable.   PHYSICAL EXAMINATION:  SKIN:  Unremarkable.  HEAD AND NECK:  No JVD or adenopathy.  Neck was supple.  CHEST:  Clear.  CARDIAC:  Exam initially showed an irregularly irregular tachycardic  rhythm without gallop or rub.  No murmur was appreciated.  Breath sounds  were clear.  ABDOMEN:  Benign, soft, nontender.  No organomegaly or masses.  EXTREMITIES:  Without clubbing, status edema.  NEUROLOGIC:  Hypesthesia in lower extremities which is her baseline but  no focal findings otherwise.   LABORATORY:  Reveals a normal  H&H and white count, platelet count  normal.  INR was 1.  Electrolytes revealed mild hypokalemia at 3.3.  BUN  and creatinine were normal.  Liver function tests and metabolic studies  negative.   IMPRESSION:  1. New onset rapid atrial fibrillation.  The patient is admitted for      rate control.  Due to the unknown duration of this arrhythmia,  she      will be anticoagulated before she was cardioverted to prevent any      possibility of stroke.  Heart rate control will be obtained with      parenteral diltiazem, monitoring blood pressure and vital signs of      course.  She will be started on heparin per pharmacy protocol as      well as Coumadin.  2. Hypokalemia.  Supplement oral potassium given.  3. Hypertension.  Intervene as needed depending on her response to      Cardizem, probably  resume home medications.  Cardiology      consultation is anticipated.  Will monitor her H&H as well.      Further studies will include chest x-ray to rule out any      pericardial process contributing to her new onset atrial      fibrillation.  Echocardiogram as well will be obtained.  Thyroid      function tests need to be assessed as well.      Madelin Rear. Sherwood Gambler, MD  Electronically Signed     LJF/MEDQ  D:  06/20/2006  T:  06/20/2006  Job:  [id number]
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
## 2010-07-08 NOTE — Procedures (Signed)
NAMEALENCIA, GORDON            ACCOUNT NO.:  [id number]   MEDICAL RECORD NO.:  [id number]          PATIENT TYPE:  INP   LOCATION:  A217                          FACILITY:  [facility]   PHYSICIAN:  Dani Gobble, MD       DATE OF BIRTH:  [birth year]   DATE OF PROCEDURE:  07/21/2005  DATE OF DISCHARGE:                                  ECHOCARDIOGRAM   INDICATIONS:  Chest pain.   The technical quality of the study is reasonable.   The aorta measures normally at 2.7 cm.   The left atrium appears to be mild to moderately dilated.  The patient  appeared to be in sinus rhythm during procedure.   The interventricular septum and posterior wall are mildly thickened measured  at 1.3 cm for each.   The aortic valve is trileaflet and moderately thickened and calcified with a  mild decrease in leaflet excursion.  Mild aortic insufficiency is noted.  Doppler interrogation of the aortic valve reveals a peak velocity of 2  meters per second corresponding to a peak gradient of 16 mmHg and a mean  gradient 11 mmHg with an area by Doppler of 1.7 cm2.   The mitral valve appears mildly thickened but without limitation to leaflet  excursion.  Trivial mitral regurgitation is noted.  Doppler interrogation of  the mitral valve is within normal limits.   The pulmonic valve appears grossly structurally normal with mild pulmonic  insufficiency noted.   The tricuspid valve was not well visualized but appeared to be grossly  structurally normal with mild tricuspid regurgitation noted.   The left ventricle measures normally in size with the LV IDD measured at 4.4  cm and the LV IC measured at 2.7 cm.  Overall left systolic function appears  normal.  Ejection fraction is normal.  In one view only (the short axis) the  inferior posterior wall appeared to be somewhat dyskinetic/hypokinetic but  this was not appreciated in any other view.  The presence of diastolic  dysfunction is inferred from pulse wave  Doppler across the mitral valve.   The right-sided structures were not well visualized but appeared to be  grossly structurally normal.  The IVC appeared mildly dilated but with  normal respiratory affect.   IMPRESSION:  1.  Mild to moderate left atrial enlargement.  2.  Mild concentric LVH.  3.  Very mild aortic stenosis with mild AI.  4.  Trivial mitral regurgitation.  5.  Mild pulmonic insufficiency.  6.  Mild tricuspid regurgitation.  7.  Normal left ventricular and overall ejection fraction.  In one view only      (short axis) the inferior and posterior wall appeared dyskinetic to      hypokinetic but this was not confirmed or appreciated in any other view.  8.  The presence of diastolic dysfunction is inferred from pulse wave      Doppler across the mitral valve.  9.  Mildly dilated IVC with normal respiratory affect.           ______________________________  Dani Gobble, MD     AB/MEDQ  D:  07/21/2005  T:  07/21/2005  Job:  [id number]

## 2010-07-15 ENCOUNTER — Other Ambulatory Visit (HOSPITAL_COMMUNITY): Payer: Self-pay | Admitting: Internal Medicine

## 2010-07-15 DIAGNOSIS — Z139 Encounter for screening, unspecified: Secondary | ICD-10-CM

## 2010-07-29 ENCOUNTER — Emergency Department (HOSPITAL_COMMUNITY): Payer: Medicare Other

## 2010-07-29 ENCOUNTER — Emergency Department (HOSPITAL_COMMUNITY)
Admission: EM | Admit: 2010-07-29 | Discharge: 2010-07-30 | Disposition: A | Discharge status: Nursing Home (Includes ICF) | Payer: Medicare Other | Source: Home / Self Care | Attending: Emergency Medicine | Admitting: Emergency Medicine

## 2010-07-29 ENCOUNTER — Encounter (HOSPITAL_COMMUNITY): Payer: Self-pay | Admitting: Radiology

## 2010-07-29 HISTORY — DX: Essential (primary) hypertension: I10

## 2010-07-29 LAB — COMPREHENSIVE METABOLIC PANEL
Alkaline Phosphatase: 96 U/L (ref 39–117)
BUN: 9 mg/dL (ref 6–23)
Chloride: 100 mEq/L (ref 96–112)
Glucose, Bld: 99 mg/dL (ref 70–99)
Potassium: 3.1 mEq/L — ABNORMAL LOW (ref 3.5–5.1)
Total Bilirubin: 0.6 mg/dL (ref 0.3–1.2)

## 2010-07-29 LAB — CBC
Hemoglobin: 14.6 g/dL (ref 12.0–15.0)
MCHC: 34 g/dL (ref 30.0–36.0)
WBC: 5.5 10*3/uL (ref 4.0–10.5)

## 2010-07-29 LAB — POCT I-STAT, CHEM 8
Creatinine, Ser: 0.6 mg/dL (ref 0.4–1.2)
HCT: 46 % (ref 36.0–46.0)
Hemoglobin: 15.6 g/dL — ABNORMAL HIGH (ref 12.0–15.0)
Potassium: 3.8 mEq/L (ref 3.5–5.1)
Sodium: 138 mEq/L (ref 135–145)

## 2010-07-29 LAB — DIFFERENTIAL
Basophils Absolute: 0 10*3/uL (ref 0.0–0.1)
Basophils Relative: 0 % (ref 0–1)
Monocytes Absolute: 0.4 10*3/uL (ref 0.1–1.0)
Neutro Abs: 1.5 10*3/uL — ABNORMAL LOW (ref 1.7–7.7)
Neutrophils Relative %: 28 % — ABNORMAL LOW (ref 43–77)

## 2010-07-29 LAB — PROTIME-INR
INR: 1.09 (ref 0.00–1.49)
Prothrombin Time: 14.3 seconds (ref 11.6–15.2)

## 2010-07-29 LAB — CK TOTAL AND CKMB (NOT AT ARMC): CK, MB: 3.1 ng/mL (ref 0.3–4.0)

## 2010-07-30 ENCOUNTER — Inpatient Hospital Stay (HOSPITAL_COMMUNITY): Payer: Medicare Other

## 2010-07-30 ENCOUNTER — Inpatient Hospital Stay (HOSPITAL_COMMUNITY)
Admission: EM | Admit: 2010-07-30 | Discharge: 2010-08-04 | DRG: 062 | Disposition: A | Discharge status: Rehabilitation Facility | Payer: Medicare Other | Source: Other Acute Inpatient Hospital | Attending: Neurology | Admitting: Neurology

## 2010-07-30 DIAGNOSIS — G8190 Hemiplegia, unspecified affecting unspecified side: Secondary | ICD-10-CM | POA: Diagnosis present

## 2010-07-30 DIAGNOSIS — F17200 Nicotine dependence, unspecified, uncomplicated: Secondary | ICD-10-CM | POA: Diagnosis present

## 2010-07-30 DIAGNOSIS — R471 Dysarthria and anarthria: Secondary | ICD-10-CM | POA: Diagnosis present

## 2010-07-30 DIAGNOSIS — G609 Hereditary and idiopathic neuropathy, unspecified: Secondary | ICD-10-CM | POA: Diagnosis present

## 2010-07-30 DIAGNOSIS — Z7901 Long term (current) use of anticoagulants: Secondary | ICD-10-CM

## 2010-07-30 DIAGNOSIS — I6340 Cerebral infarction due to embolism of unspecified cerebral artery: Principal | ICD-10-CM | POA: Diagnosis present

## 2010-07-30 DIAGNOSIS — R269 Unspecified abnormalities of gait and mobility: Secondary | ICD-10-CM | POA: Diagnosis present

## 2010-07-30 DIAGNOSIS — Z9861 Coronary angioplasty status: Secondary | ICD-10-CM

## 2010-07-30 DIAGNOSIS — K219 Gastro-esophageal reflux disease without esophagitis: Secondary | ICD-10-CM | POA: Diagnosis present

## 2010-07-30 DIAGNOSIS — I2510 Atherosclerotic heart disease of native coronary artery without angina pectoris: Secondary | ICD-10-CM | POA: Diagnosis present

## 2010-07-30 DIAGNOSIS — I4891 Unspecified atrial fibrillation: Secondary | ICD-10-CM | POA: Diagnosis present

## 2010-07-30 LAB — URINALYSIS, ROUTINE W REFLEX MICROSCOPIC
Nitrite: NEGATIVE
Protein, ur: NEGATIVE mg/dL
Urobilinogen, UA: 1 mg/dL (ref 0.0–1.0)

## 2010-07-30 LAB — CBC
HCT: 38.9 % (ref 36.0–46.0)
Hemoglobin: 13.2 g/dL (ref 12.0–15.0)
MCH: 30.5 pg (ref 26.0–34.0)
MCHC: 33.9 g/dL (ref 30.0–36.0)
RDW: 14.8 % (ref 11.5–15.5)

## 2010-07-30 LAB — LIPID PANEL
LDL Cholesterol: 29 mg/dL (ref 0–99)
Total CHOL/HDL Ratio: 2.1 RATIO
VLDL: 15 mg/dL (ref 0–40)

## 2010-07-30 LAB — URINE MICROSCOPIC-ADD ON

## 2010-07-30 LAB — COMPREHENSIVE METABOLIC PANEL
ALT: 22 U/L (ref 0–35)
Albumin: 3.1 g/dL — ABNORMAL LOW (ref 3.5–5.2)
Alkaline Phosphatase: 73 U/L (ref 39–117)
Chloride: 105 mEq/L (ref 96–112)
Potassium: 2.9 mEq/L — ABNORMAL LOW (ref 3.5–5.1)
Sodium: 141 mEq/L (ref 135–145)
Total Bilirubin: 0.6 mg/dL (ref 0.3–1.2)
Total Protein: 6.3 g/dL (ref 6.0–8.3)

## 2010-07-30 LAB — POTASSIUM: Potassium: 3.6 mEq/L (ref 3.5–5.1)

## 2010-07-30 LAB — CARDIAC PANEL(CRET KIN+CKTOT+MB+TROPI)
CK, MB: 2.2 ng/mL (ref 0.3–4.0)
Troponin I: <0.3 ng/mL (ref <0.30)

## 2010-07-30 LAB — PROTIME-INR: Prothrombin Time: 15.9 seconds — ABNORMAL HIGH (ref 11.6–15.2)

## 2010-07-30 LAB — MRSA PCR SCREENING: MRSA by PCR: NEGATIVE

## 2010-07-31 ENCOUNTER — Inpatient Hospital Stay (HOSPITAL_COMMUNITY): Payer: Medicare Other

## 2010-07-31 LAB — CBC
MCV: 88.7 fL (ref 78.0–100.0)
Platelets: 141 10*3/uL — ABNORMAL LOW (ref 150–400)
RBC: 4.79 MIL/uL (ref 3.87–5.11)
WBC: 6.9 10*3/uL (ref 4.0–10.5)

## 2010-07-31 LAB — DIFFERENTIAL
Eosinophils Absolute: 0.1 10*3/uL (ref 0.0–0.7)
Lymphocytes Relative: 51 % — ABNORMAL HIGH (ref 12–46)
Lymphs Abs: 3.5 10*3/uL (ref 0.7–4.0)
Neutro Abs: 2.7 10*3/uL (ref 1.7–7.7)
Neutrophils Relative %: 39 % — ABNORMAL LOW (ref 43–77)

## 2010-07-31 LAB — COMPREHENSIVE METABOLIC PANEL
ALT: 22 U/L (ref 0–35)
AST: 33 U/L (ref 0–37)
CO2: 27 mEq/L (ref 19–32)
Chloride: 102 mEq/L (ref 96–112)
Creatinine, Ser: <0.47 mg/dL (ref 0.4–1.2)
Sodium: 141 mEq/L (ref 135–145)
Total Bilirubin: 1.4 mg/dL — ABNORMAL HIGH (ref 0.3–1.2)

## 2010-08-01 ENCOUNTER — Inpatient Hospital Stay (HOSPITAL_COMMUNITY): Payer: Medicare Other

## 2010-08-01 LAB — BASIC METABOLIC PANEL
BUN: 8 mg/dL (ref 6–23)
Creatinine, Ser: <0.47 mg/dL (ref 0.4–1.2)

## 2010-08-01 LAB — CBC
MCH: 31 pg (ref 26.0–34.0)
MCV: 88.9 fL (ref 78.0–100.0)
Platelets: 144 10*3/uL — ABNORMAL LOW (ref 150–400)
RDW: 14.7 % (ref 11.5–15.5)

## 2010-08-01 NOTE — H&P (Signed)
NAMERASHAN, PATIENT NO.:  [id number]  MEDICAL RECORD NO.:  [id number]  LOCATION:  3108                         FACILITY:  [facility]  PHYSICIAN:  Marlan Palau, M.D.  DATE OF BIRTH:  January 17, 1936  DATE OF ADMISSION:  07/30/2010 DATE OF DISCHARGE:                             HISTORY & PHYSICAL   HISTORY OF PRESENT ILLNESS:  Andrea [name] is a 75 year old white female born on 11-20-1935, with a history of atrial fibrillation, tobacco abuse, on chronic Coumadin therapy.  This patient had elevated INR recently and was taken off her Coumadin for several days.  The patient was at [employer] this evening and around 10:15 p.m., was noted to have sudden onset of aphasia.  The patient was brought to [facility] immediately following the deficit.  Blood work revealed an INR of 1.09.  CT of the head was unremarkable.  Code stroke was called and full-dose IV t-PA was administered.  A NIH stroke scale is 18.  The patient was transported to [facility] for further management. The patient had not been complaining of any other significant issues prior to the onset of the stroke this event.  PAST MEDICAL HISTORY:  Significant for: 1. 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 2. Atrial fibrillation. 3. History of peripheral neuropathy with gait disorder. 4. Mitral valve prolapse. 5. Coronary artery disease with stent placement. 6. Gastroesophageal reflux disease. 7. Gallbladder resection. 8. D and C. 9. Tonsillectomy. 10.History of skin cancer resection.  MEDICATIONS:  At this time include: 1. Estazolam 2 mg at night if needed for sleep. 2. Aciphex 40 mg b.i.d. 3. Metoprolol 25 mg twice daily. 4. Lipitor 10 mg daily. 5. Benazepril 20 mg daily. 6. Boniva 150 mg a month. 7. Niacin 500 mg daily. 8. Coumadin therapy. 9. Hydralazine 25 mg b.i.d.  ALLERGIES:  The patient has an allergy to IVP DYE, MACROBID,  PENICILLIN, SULFA drugs.  FAMILY HISTORY:  Unknown.  The patient is adopted.  She does have one brother who lives in the [place] area.  Health history is unknown.  SOCIAL HISTORY:  The patient is married.  Lives in the [place] area.  She is retired.  The patient smokes a pack of cigarettes daily.  Does not drink alcohol.  REVIEW OF SYSTEMS:  Unobtainable.  The patient is severely aphasic and severely dysarthric.  PHYSICAL EXAMINATION:  VITALS:  Blood pressure is 135/85, heart rate 72, respiratory rate 20, temperature afebrile.  The patient is in atrial fibrillation. HEENT:  Head is atraumatic.  Eyes:  Pupils are equal, round, and reactive to light. NECK:  Supple.  No carotid bruits noted. RESPIRATORY:  Clear. CARDIOVASCULAR:  Occasional irregular heart rate.  No obvious murmurs or rubs noted. EXTREMITIES:  Without significant edema. ABDOMEN:  Positive bowel sounds.  No organomegaly or tenderness noted. NEUROLOGIC:  Cranial nerves as above.  Facial symmetry is not present. There is a depression in the right nasolabial fold.  Extraocular movements are relatively full.  The patient does not blink to threat well from either side.  The patient is severely dysarthric and unintelligible.  Does not follow pure  verbal commands.  The patient appears to respond to pain stimulation on both the sides.  The patient has right arm and leg weakness.  Fairly good strength on the left arm. The patient has some mild proximal weakness of the left leg.  Bilateral foot drops are noted.  The deep tendon reflexes are depressed and absent throughout.  Toes are neutral bilaterally.  The patient has no obvious ataxia, is somewhat tremulous with upper extremities.  LABORATORY DATA:  Laboratory values notable for white count of 5.5, hemoglobin of 14.6, hematocrit 42.9, MCV of 91.1, platelets of 139.  INR of 1.09.  Sodium 138, potassium 3.8, chloride of 106, CO2 of 29, glucose of 99, BUN  of 9, creatinine 0.6.  Total bili 0.6, alk phosphatase 96, SGOT of 34, SGPT of 23, total protein 7.6, albumin 3.8, calcium 9.8.  CK of 100, MB fraction 3.1, troponin I less than 0.3.  CT of the head is as above.  IMPRESSION: 1. New onset, left brain stroke. 2. Atrial fibrillation, off Coumadin. 3. Tobacco abuse.  The patient likely suffered a cardioembolic stroke while off Coumadin. The patient is admitted to [facility] following full-dose t-PA. The patient will undergo an MRI of the brain and MRA of the head.  A 2-D echocardiogram will be done.  The patient will potentially go back on heparin before she gets back on Coumadin.  The patient will require physical, occupational, and speech therapy.  We will follow patient's clinical course while in-house.     Marlan Palau, M.D.     CKW/MEDQ  D:  07/30/2010  T:  07/30/2010  Job:  [id number]  cc:   Madelin Rear. Sherwood Gambler, MD [facility]  Electronically Signed by Thana Farr M.D. on 08/01/2010 10:42:39 AM

## 2010-08-02 LAB — HEPARIN LEVEL (UNFRACTIONATED)
Heparin Unfractionated: 0.26 IU/mL — ABNORMAL LOW (ref 0.30–0.70)
Heparin Unfractionated: 0.22 IU/mL — ABNORMAL LOW (ref 0.30–0.70)

## 2010-08-02 LAB — CBC
Hemoglobin: 13.5 g/dL (ref 12.0–15.0)
RBC: 4.41 MIL/uL (ref 3.87–5.11)
WBC: 7.4 10*3/uL (ref 4.0–10.5)

## 2010-08-02 LAB — PROTIME-INR
INR: 1.21 (ref 0.00–1.49)
Prothrombin Time: 15.5 seconds — ABNORMAL HIGH (ref 11.6–15.2)

## 2010-08-02 NOTE — Procedures (Signed)
REFERRING PHYSICIAN:  Marlan Palau, MD  HISTORY:  A 75 year old female with a sudden onset of aphasia and right facial droop and right hemiparesis.  MEDICATIONS:  Heparin, Ativan, Tylenol, Normodyne, Lopressor, Cardene.  CONDITIONS OF RECORDING:  This is a 16-channel EEG carried out with the patient in the awake state.  DESCRIPTION:  The background activity is marred by muscle and movement artifact.  There are periods when the background activity can be evaluated.  During these periods of time, a maximum posterior background rhythm can be seen at 7 Hz theta activity.  Often the background activity is slow with theta rhythms.  There is a paucity of alpha activity even in the central and temporal regions as well with theta activity predominating.  There are some faster activities noted anteriorly.  The patient does not drowse with sleep.  Hypoventilation and intermittent photic stimulation were not performed.  No epileptiform activity is noted.  IMPRESSION:  This is an abnormal EEG secondary to posterior background slowing.  This finding is consistent with a diffuse disturbance that is etiologically nonspecific, but may include a metabolic encephalopathy among other possibilities.          ______________________________ Thana Farr, MD    OZ:HYQM D:  08/01/2010 17:38:45  T:  08/02/2010 03:49:11  Job #:  [id number]

## 2010-08-03 DIAGNOSIS — I69921 Dysphasia following unspecified cerebrovascular disease: Secondary | ICD-10-CM

## 2010-08-03 DIAGNOSIS — G608 Other hereditary and idiopathic neuropathies: Secondary | ICD-10-CM

## 2010-08-03 DIAGNOSIS — I6340 Cerebral infarction due to embolism of unspecified cerebral artery: Secondary | ICD-10-CM

## 2010-08-03 DIAGNOSIS — I69993 Ataxia following unspecified cerebrovascular disease: Secondary | ICD-10-CM

## 2010-08-03 LAB — CBC
MCHC: 35.8 g/dL (ref 30.0–36.0)
Platelets: 135 10*3/uL — ABNORMAL LOW (ref 150–400)
RDW: 14.8 % (ref 11.5–15.5)
WBC: 6.7 10*3/uL (ref 4.0–10.5)

## 2010-08-03 LAB — PROTIME-INR
INR: 1.26 (ref 0.00–1.49)
Prothrombin Time: 16 seconds — ABNORMAL HIGH (ref 11.6–15.2)

## 2010-08-04 ENCOUNTER — Inpatient Hospital Stay (HOSPITAL_COMMUNITY)
Admission: RE | Admit: 2010-08-04 | Discharge: 2010-08-13 | DRG: 945 | Disposition: A | Discharge status: Home Health | Payer: Medicare Other | Source: Other Acute Inpatient Hospital | Attending: Physical Medicine & Rehabilitation | Admitting: Physical Medicine & Rehabilitation

## 2010-08-04 DIAGNOSIS — D696 Thrombocytopenia, unspecified: Secondary | ICD-10-CM | POA: Diagnosis present

## 2010-08-04 DIAGNOSIS — F17200 Nicotine dependence, unspecified, uncomplicated: Secondary | ICD-10-CM | POA: Diagnosis present

## 2010-08-04 DIAGNOSIS — G4700 Insomnia, unspecified: Secondary | ICD-10-CM | POA: Diagnosis present

## 2010-08-04 DIAGNOSIS — I6330 Cerebral infarction due to thrombosis of unspecified cerebral artery: Secondary | ICD-10-CM

## 2010-08-04 DIAGNOSIS — R209 Unspecified disturbances of skin sensation: Secondary | ICD-10-CM | POA: Diagnosis present

## 2010-08-04 DIAGNOSIS — Z5189 Encounter for other specified aftercare: Secondary | ICD-10-CM

## 2010-08-04 DIAGNOSIS — I10 Essential (primary) hypertension: Secondary | ICD-10-CM | POA: Diagnosis present

## 2010-08-04 DIAGNOSIS — G589 Mononeuropathy, unspecified: Secondary | ICD-10-CM | POA: Diagnosis present

## 2010-08-04 DIAGNOSIS — M216X9 Other acquired deformities of unspecified foot: Secondary | ICD-10-CM | POA: Diagnosis present

## 2010-08-04 DIAGNOSIS — E876 Hypokalemia: Secondary | ICD-10-CM | POA: Diagnosis present

## 2010-08-04 DIAGNOSIS — I6350 Cerebral infarction due to unspecified occlusion or stenosis of unspecified cerebral artery: Secondary | ICD-10-CM | POA: Diagnosis present

## 2010-08-04 DIAGNOSIS — G8110 Spastic hemiplegia affecting unspecified side: Secondary | ICD-10-CM

## 2010-08-04 DIAGNOSIS — Z7901 Long term (current) use of anticoagulants: Secondary | ICD-10-CM

## 2010-08-04 DIAGNOSIS — G5790 Unspecified mononeuropathy of unspecified lower limb: Secondary | ICD-10-CM | POA: Diagnosis present

## 2010-08-04 DIAGNOSIS — R5383 Other fatigue: Secondary | ICD-10-CM | POA: Diagnosis present

## 2010-08-04 DIAGNOSIS — R4701 Aphasia: Secondary | ICD-10-CM | POA: Diagnosis present

## 2010-08-04 DIAGNOSIS — R1310 Dysphagia, unspecified: Secondary | ICD-10-CM | POA: Diagnosis present

## 2010-08-04 DIAGNOSIS — I69921 Dysphasia following unspecified cerebrovascular disease: Secondary | ICD-10-CM

## 2010-08-04 DIAGNOSIS — R5381 Other malaise: Secondary | ICD-10-CM | POA: Diagnosis present

## 2010-08-04 DIAGNOSIS — I4891 Unspecified atrial fibrillation: Secondary | ICD-10-CM | POA: Diagnosis present

## 2010-08-04 LAB — HEPARIN LEVEL (UNFRACTIONATED): Heparin Unfractionated: 0.37 IU/mL (ref 0.30–0.70)

## 2010-08-04 LAB — PROTIME-INR: Prothrombin Time: 22.3 seconds — ABNORMAL HIGH (ref 11.6–15.2)

## 2010-08-04 LAB — CBC
HCT: 35.6 % — ABNORMAL LOW (ref 36.0–46.0)
MCH: 30.7 pg (ref 26.0–34.0)
MCHC: 34.8 g/dL (ref 30.0–36.0)
RDW: 14.9 % (ref 11.5–15.5)

## 2010-08-05 DIAGNOSIS — I6330 Cerebral infarction due to thrombosis of unspecified cerebral artery: Secondary | ICD-10-CM

## 2010-08-05 DIAGNOSIS — I69921 Dysphasia following unspecified cerebrovascular disease: Secondary | ICD-10-CM

## 2010-08-05 DIAGNOSIS — G8110 Spastic hemiplegia affecting unspecified side: Secondary | ICD-10-CM

## 2010-08-05 LAB — CBC
MCH: 30.9 pg (ref 26.0–34.0)
Platelets: 123 10*3/uL — ABNORMAL LOW (ref 150–400)
RBC: 4.31 MIL/uL (ref 3.87–5.11)
WBC: 5.2 10*3/uL (ref 4.0–10.5)

## 2010-08-05 LAB — PROTIME-INR: Prothrombin Time: 30.5 seconds — ABNORMAL HIGH (ref 11.6–15.2)

## 2010-08-05 LAB — DIFFERENTIAL
Basophils Relative: 0 % (ref 0–1)
Eosinophils Absolute: 0.2 10*3/uL (ref 0.0–0.7)
Neutrophils Relative %: 52 % (ref 43–77)

## 2010-08-05 LAB — COMPREHENSIVE METABOLIC PANEL
Albumin: 2.9 g/dL — ABNORMAL LOW (ref 3.5–5.2)
Alkaline Phosphatase: 76 U/L (ref 39–117)
BUN: 7 mg/dL (ref 6–23)
Potassium: 3.3 mEq/L — ABNORMAL LOW (ref 3.5–5.1)
Sodium: 137 mEq/L (ref 135–145)
Total Protein: 6.4 g/dL (ref 6.0–8.3)

## 2010-08-06 DIAGNOSIS — I69921 Dysphasia following unspecified cerebrovascular disease: Secondary | ICD-10-CM

## 2010-08-06 DIAGNOSIS — G8110 Spastic hemiplegia affecting unspecified side: Secondary | ICD-10-CM

## 2010-08-06 DIAGNOSIS — I6330 Cerebral infarction due to thrombosis of unspecified cerebral artery: Secondary | ICD-10-CM

## 2010-08-06 LAB — PROTIME-INR: Prothrombin Time: 31.3 seconds — ABNORMAL HIGH (ref 11.6–15.2)

## 2010-08-07 LAB — PROTIME-INR
INR: 2.01 — ABNORMAL HIGH (ref 0.00–1.49)
Prothrombin Time: 23.1 seconds — ABNORMAL HIGH (ref 11.6–15.2)

## 2010-08-07 NOTE — Discharge Summary (Signed)
NAMEDELORA, [name]            ACCOUNT NO.:  [id number]  MEDICAL RECORD NO.:  [id number]  LOCATION:  3027                         FACILITY:  [facility]  PHYSICIAN:  Shaunna Rosetti P. Pearlean Brownie, MD    DATE OF BIRTH:  04-06-1935  DATE OF ADMISSION:  07/30/2010 DATE OF DISCHARGE:  08/04/2010                              DISCHARGE SUMMARY   ADMISSION DIAGNOSIS:  Stroke.  DISCHARGE DIAGNOSES: 1. Left frontal middle cerebral artery infarct of cardioembolic     etiology from atrial fibrillation. 2. Suboptimal INR due to anticoagulation having been recently held due     to supratherapeutic INR a few days prior to her stroke. 3. Coronary artery disease status post stent placement. 4. Chronic gait disorder with peripheral neuropathy.  HOSPITAL COURSE:  Andrea [name] is a 75 year old Caucasian lady who developed sudden onset of aphasia and right hemiparesis while at [facility] on the evening of admission.  She was taken to [facility] immediately and a code stroke was called.  INR was found to be 1.09 and she had not had Coumadin for few days prior and I just restarted it as her previous INR was supratherapeutic.  CT scan of the head was unremarkable.  The patient was given full dose IV t-PA.  Her NIH stroke scale on admission was 18.  She was transferred to [facility] where she was admitted to the intensive care unit.  Her right hemiparesis improved upon arrival but she still had significant expressive aphasia which persisted.  Blood pressure was tightly controlled.  Followup MRI scan of the brain showed a left frontal middle cerebral artery branch infarct involving the frontal operculum.  There was also questionable punctate area of restricted diffusion in the inferior right parietal lobes.  There were age-related changes of small- vessel disease and atrophy.  MRA of the brain showed no large vessel stenosis.  Carotid ultrasound showed no significant extracranial stenosis.   Hemoglobin A1c was normal at 5.6%.  Lipid profile showed total cholesterol of 85 mg percent, triglycerides 77, HDL 41 and LDL 29 mg percent.  Cardiac panel enzymes were negative.  UA was unremarkable. The patient was started on heparin infusion and Coumadin was started. She was seen by Physical, Occupational, and Speech Therapy.  She required Speech Therapy for language.  She was felt to be candidate for inpatient rehab and was medically stable for discharge to rehab on August 04, 2010.  She still had persistent expressive aphasia with mild right lower facial weakness but had no other extremity weakness.  On the day of discharge, her INR 1.94, heparin drip was discontinued at the time of discharge.  DISCHARGE MEDICATIONS: 1. Lotensin 20 mg daily. 2. Calcium carbonate 500 daily. 3. Ensure 113 mL three times a day. 4. Metoprolol 25 twice a day. 5. Niacin 500 at bedtime. 6. Nicotine patch 14 mg a day. 7. Protonix 40 mg twice a day. 8. Prazosin 2 mg daily. 9. Crestor 5 mg daily. 10.Coumadin dose per pharmacy to keep INR 2-3.  The patient was advised to follow up with Dr. Pearlean Brownie in his office in 2 months and with her primary care physician at [facility] in a few weeks after  discharge.     Corneluis Allston P. Pearlean Brownie, MD     PPS/MEDQ  D:  08/04/2010  T:  08/05/2010  Job:  [id number]  Electronically Signed by Delia Heady MD on 08/07/2010 08:50:09 PM

## 2010-08-08 ENCOUNTER — Inpatient Hospital Stay (HOSPITAL_COMMUNITY): Payer: Medicare Other

## 2010-08-08 DIAGNOSIS — I69921 Dysphasia following unspecified cerebrovascular disease: Secondary | ICD-10-CM

## 2010-08-08 DIAGNOSIS — G8110 Spastic hemiplegia affecting unspecified side: Secondary | ICD-10-CM

## 2010-08-08 DIAGNOSIS — I6330 Cerebral infarction due to thrombosis of unspecified cerebral artery: Secondary | ICD-10-CM

## 2010-08-08 LAB — PROTIME-INR: Prothrombin Time: 25.3 seconds — ABNORMAL HIGH (ref 11.6–15.2)

## 2010-08-08 LAB — BASIC METABOLIC PANEL
BUN: 7 mg/dL (ref 6–23)
CO2: 30 mEq/L (ref 19–32)
Calcium: 9.1 mg/dL (ref 8.4–10.5)
Glucose, Bld: 90 mg/dL (ref 70–99)
Sodium: 140 mEq/L (ref 135–145)

## 2010-08-09 LAB — PROTIME-INR: INR: 1.93 — ABNORMAL HIGH (ref 0.00–1.49)

## 2010-08-10 LAB — BASIC METABOLIC PANEL
Chloride: 101 mEq/L (ref 96–112)
GFR calc Af Amer: >60 mL/min (ref >60)
GFR calc non Af Amer: >60 mL/min (ref >60)
Glucose, Bld: 108 mg/dL — ABNORMAL HIGH (ref 70–99)
Potassium: 4.3 mEq/L (ref 3.5–5.1)
Sodium: 139 mEq/L (ref 135–145)

## 2010-08-11 DIAGNOSIS — G8110 Spastic hemiplegia affecting unspecified side: Secondary | ICD-10-CM

## 2010-08-11 DIAGNOSIS — I6330 Cerebral infarction due to thrombosis of unspecified cerebral artery: Secondary | ICD-10-CM

## 2010-08-11 DIAGNOSIS — I69921 Dysphasia following unspecified cerebrovascular disease: Secondary | ICD-10-CM

## 2010-08-11 LAB — PROTIME-INR: INR: 1.9 — ABNORMAL HIGH (ref 0.00–1.49)

## 2010-08-12 LAB — PROTIME-INR
INR: 1.74 — ABNORMAL HIGH (ref 0.00–1.49)
Prothrombin Time: 20.7 seconds — ABNORMAL HIGH (ref 11.6–15.2)

## 2010-08-13 LAB — PROTIME-INR: INR: 1.77 — ABNORMAL HIGH (ref 0.00–1.49)

## 2010-08-19 ENCOUNTER — Ambulatory Visit (HOSPITAL_COMMUNITY): Payer: Medicare Other

## 2010-08-22 ENCOUNTER — Encounter: Payer: Medicare Other | Attending: Physical Medicine & Rehabilitation

## 2010-08-22 ENCOUNTER — Ambulatory Visit: Payer: Medicare Other | Admitting: Physical Medicine & Rehabilitation

## 2010-08-22 DIAGNOSIS — R29898 Other symptoms and signs involving the musculoskeletal system: Secondary | ICD-10-CM | POA: Insufficient documentation

## 2010-08-22 DIAGNOSIS — R279 Unspecified lack of coordination: Secondary | ICD-10-CM | POA: Insufficient documentation

## 2010-08-22 DIAGNOSIS — M216X9 Other acquired deformities of unspecified foot: Secondary | ICD-10-CM | POA: Insufficient documentation

## 2010-08-22 DIAGNOSIS — I69991 Dysphagia following unspecified cerebrovascular disease: Secondary | ICD-10-CM

## 2010-08-22 DIAGNOSIS — Z0271 Encounter for disability determination: Secondary | ICD-10-CM

## 2010-08-22 DIAGNOSIS — G609 Hereditary and idiopathic neuropathy, unspecified: Secondary | ICD-10-CM | POA: Insufficient documentation

## 2010-08-22 DIAGNOSIS — I69920 Aphasia following unspecified cerebrovascular disease: Secondary | ICD-10-CM | POA: Insufficient documentation

## 2010-08-22 DIAGNOSIS — I69921 Dysphasia following unspecified cerebrovascular disease: Secondary | ICD-10-CM

## 2010-08-22 DIAGNOSIS — K5900 Constipation, unspecified: Secondary | ICD-10-CM | POA: Insufficient documentation

## 2010-08-22 DIAGNOSIS — H6120 Impacted cerumen, unspecified ear: Secondary | ICD-10-CM | POA: Insufficient documentation

## 2010-08-23 NOTE — Assessment & Plan Note (Signed)
HISTORY:  A 75 year old female with prior history of neuropathy causing bilateral foot drop, history of atrial fibrillation, was off Coumadin because of an elevated warfarin level then had a CVA causing right-sided weakness and aphasia.  Her INR initially 1.09.  MRI showed restricted diffuse in left parietal lobe, started on IV heparin presumed cardioembolic left MCA infarct.  She had dysphagia requiring thickened liquids.  She went through inpatient rehabilitation on August 04, 2010 and was discharged approximately 1 week ago.  She complains of some left ear fullness.  She complains of constipation.  No falls.  Functional status is independent with dressing, needs assist with bathing in terms of getting on and off the tub bench and into tub.  She has had no other new problems in the interval time.  CURRENT MEDICATIONS: 1. Benazepril 20 mg twice a day. 2. Warfarin 3 mg a day. 3. Lipitor 10 mg a day. 4. Metoprolol 25 b.i.d. 5. Minipress 2 mg a day. 6. Estazolam 2 mg a day p.r.n.  ALLERGIES:  PENICILLIN, IODINE AND SULFA.  Pain level is 4 in the mid-back area, relieved by Tylenol.  REVIEW OF SYSTEMS:  Positive for bladder and bowel control problems. Her bowel control problems, really just constipation, trouble walking, dizziness, confusion, easy bleeding.  Blood pressure 87/26.  She is very small, has a very thin arm.  No pediatric cuff available here.  Pulse is 58, respirations 18, O2 sat 100% on room air.  Well-developed, well-nourished female in no acute distress.  Her speech has improved.  She still has expressive language deficits with disfluent speech.  She is able to express herself at phrase level.  Her motor strength is 4/5 bilateral grip, 5 at bilateral deltoid, biceps triceps, 4 bilateral hip flexor, 3+ knee extensor, 0 ankle dorsiflexor and plantar flexor bilaterally.  Heart, irregularly irregular, rate is 55.  Lungs clear to auscultation. Left ear shows  ceruminosis.  IMPRESSION: 1. Middle cerebral artery infarct with main residual aphasia with     decreased balance. 2. Polyneuropathy question etiology chronic with bilateral foot drop. 3. Constipation.  No constipating medications mainly due to diet and     sedentary lifestyle.  Encouraged fluids, fruits, vegetables and     have written down docusate sodium for the patient's husband a     trial. 4. Ceruminosis.  The patient already has Debrox in the hospital and     instructed husband how to use it properly 3 drops three times a     day.  Discussed with the patient and husband, agree with plan, gave him a strokes caregiver manual.  I will see her back in 1 month at that time I think we can transition from home health to outpatient therapy likely just needing speech.     Erick Colace, M.D. Electronically Signed    AEK/MedQ D:  08/22/2010 12:40:48  T:  08/22/2010 14:18:37  Job #:  [id number]  cc:   Madelin Rear. Sherwood Gambler, MD Fax: [phone]  Richard A. Alanda Amass, M.D. Fax: [phone]

## 2010-08-26 NOTE — Discharge Summary (Signed)
NAMERONIN, CRAGER            ACCOUNT NO.:  [id number]  MEDICAL RECORD NO.:  [id number]  LOCATION:  4155                         FACILITY:  [facility]  PHYSICIAN:  Erick Colace, M.D.DATE OF BIRTH:  January 16, 1936  DATE OF ADMISSION:  08/04/2010 DATE OF DISCHARGE:  08/13/2010                              DISCHARGE SUMMARY   DISCHARGE DIAGNOSES: 1. 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 2. Neuropathy with chronic bilateral foot drop as well as hand     intrinsic weakness. 3. Hypokalemia resolved. 4. Atrial fibrillation.  HISTORY OF PRESENT ILLNESS:  Ms. Colegrove is a 75 year old female with history of AFib neuropathy who was off for Coumadin for a few days due to supratherapeutic INR.  She was admitted via [facility] on July 30, 2010, with aphasia, right-sided weakness, and subtherapeutic INR of 1.09.  MRI and MRA of brain done showed diffusion restriction in right parietal lobe and no acute changes.  Followup CCT showed left MCA operculum infarct with edema.  Carotid Dopplers done showed diffuse heterogeneous plaque and no stenosis.  A 2-D echo done showed EF of 55- 60%, no wall abnormality.  The patient was started on IV heparin for cardioembolic left MCA infarct.  She currently continues to have issues with aphasia as well as dysphagia.  MBS of June 2011, showed patient with delay in swallow as well as anterior spillage with mild residuals. She was started on D1 diet, honey liquids.  Currently, the patient requires verbal and visual cues to follow commands.  Today, she is able to answer basic yes/no questions with accuracy and improvement and expressive ability.  The patient was evaluated by rehab on August 02, 2100, and we felt that she was a good rehab candidate.  PAST MEDICAL HISTORY:  Significant for AFib, coronary artery disease with PTCA stent, mitral valve prolapse, cholecystectomy,  neuropathy with bilateral foot drop, and gait disorder, GERD, history of peripheral edema, D and C, T and A, excision of skin cancers basal cell cancers and squamous cell cancers with skin graft on right naris.  Excision of melanoma, right face.  Insomnia.  ALLERGIES:  To PENICILLIN, CODEINE, SULFA, MACROLIDES, LOVENOX, and IODINE.  REVIEW OF SYMPTOMS:  Positive for weakness, numbness, insomnia as well as issues due to bilateral foot drop.  FAMILY HISTORY:  Unknown as patient is adopted.  SOCIAL HISTORY:  The patient is married, lives in 1-level home with rapid entry and 2 steps inside.  Has a history of one-pack per day tobacco use.  Does not use any alcohol.  FUNCTIONAL HISTORY:  The patient was independent for ambulating short distances with walker.  Needs assist with ADLs.  Does not drive.  FUNCTIONAL STATUS:  The patient is +2 total assist, 60-65% for transfers, and for ambulating 2 feet with rolling walker.  Requires cuing for foot placement.  She is min assist for upper body, bathing and dressing, mod assist for lower body care.  PHYSICAL EXAMINATION:  VITAL SIGNS:  Blood pressure 156/82, pulse 108, respirations 16, temperature 97.6. GENERAL:  The patient is a thin female in no acute distress.  Appears to have bouts of frustration due  to her lack of communicative abilities. HEENT:  Eyes anicteric, noninjected.  Oral mucosa is pink and moist. Nares patent. NECK:  Supple without JVD or lymphadenopathy. LUNGS:  Clear bilaterally without wheezes, rales, or rhonchi.  The patient with pectus excavatum deformity on her chest. HEART:  Shows regular rate rhythm without murmurs, gallops, or rubs. ABDOMEN:  Soft, nontender with positive bowel sounds. EXTREMITIES:  Show no clubbing or cyanosis.  The patient had bilateral ankle edema of 1+ and dense bilateral foot drop. SKIN:  Without breakdown.  She does have small toe abrasion on first PIP joint that is well healed. NEUROLOGIC:   The patient is alert, able to state yes and no's for basic information.  She is able to follow simple commands without visual cues. Speech is alting at word level, but poor articulation and nonsensical speech also noted.  The patient with decreased sensation bilateral feet with dense foot drop 0/5 strength.  She has 3- bilateral quads and 3- bilateral hip flexures.  Upper extremity strength is 5 in left deltoid, biceps, triceps, 4 in right deltoids, biceps, triceps and 3 at right hand grips and 4/5 at left hand grip.  She has intrinsic atrophy, bilateral hands and feet.  No evidence of fasciculation, decreased DTRs bilaterally.  HOSPITAL COURSE:  Ms. Scarlettrose Costilow was admitted to rehab on August 04, 2010, for inpatient therapies to consist of PT, OT, and Speech Therapy at least 3 hours 5 days a week.  Past admission physiatrist rehab RN and therapy team have worked together to provide customized collaborative interdisciplinary care.  Rehab RN has worked with patient on bowel and bladder program as well as close monitoring of p.o. intake.  The patient's heparin was discontinued at the time of admission and Pharmacy has been following and adjusting patient's Coumadin doses to maintain INR within therapeutic levels.  The patient's voiding was monitored with PVR checks and the patient was noted to be voiding without difficulty. The patient with extreme dislike of D1, honey-thick diet, but nutritional supplements were offered by nursing frequently.  Labs were done past admission showing patient with thrombocytopenia with platelets at 123 as well as hypokalemia with potassium at 3.3.  Her potassium was supplemented.  Her platelets have been followed along.  A repeat swallow study was done on August 08, 2010, and patient was noted to have functional improvement in swallow and she was advanced to D2 diet thin liquids.  Full supervision was provided by staff.  Repeat labs were done on August 10, 2010, to monitor patient's hydration status as well as hypokalemia.  This revealed BUN and creatinine at 7 and less than 0.47. Last check of labs prior to discharge was on August 10, 2010, revealed sodium 139, potassium 4.3, chloride 101, CO2 of 30, BUN 10, creatinine 0.54, glucose 108.  The patient's INR was therapeutic during most of the stay.  On August 12, 2010, INR was noted to drop at 1.74, and patient's Coumadin was bumped up to 4 mg x1 day.  The patient to be discharged on 3 mg per day with pro times to be managed by Dr. Sherwood Gambler past discharge.  The patient's blood pressures have been monitored on b.i.d. basis. These were noted to be labile and her ACE was increased to b.i.d. basis with better BP control.  During patient's stay in rehab, weekly team conferences were held to monitor patient's progress, set goals, as well as discuss barriers to discharge.  At the time of admission, the patient was  noted to have decrease in lower extremity weakness due to her stroke.  The patient was limited by a chronic bilateral foot drop and had history of frequent falls at least once a month prior to admission. Both patient and husband reported the patient's dislike of any braces as well as dislike of wearing any shoes, as they felt this hindered her mobility.  They declined any AFOs. The patient made steady progress during her stay and had progressed to being at supervision to min assist overall for transfers.  It was recommended that she primarily use wheelchair for locomotion as she is unsafe to perform longer distance gait due to bilateral lower extremity biomechanical instability. Husband has been trained for providing supervision and assistance with transfers.  Further followup home health PT to continue past discharge. OT has been working with patient on self-care tasks.  At time of admission, the patient was limited by her right-sided weakness with decrease in balance, decrease in safety  awareness, as well as aphasia. She progressed to being at supervision level overall  for bathing and dressing.  She continued to have decrease in strength in bilateral upper and lower extremity which impacted her overall functional mobility.  She did however learn alternative ways to transfer safely and husband will provide assistance as needed past discharge.  Speech Therapy has worked with patient on dysphasia treatment.  The patient was advanced to D2 diet thin liquids.  She was modified independent for utilization of strategies for safe swallow.  Speech Therapy also worked with the patient on memory aids to recall day-to-day information as well as utilization of slower and articulated speech to improve clarity of speech.  She was min assist to utilize speech strategies.  Further followup Speech Therapy to continue past discharge.  DISCHARGE MEDICATIONS: 1. Tylenol 325-650 mg p.o. q.4 hours p.r.n. pain. 2. Benazepril 20 mg p.o. b.i.d. 3. Ensure pudding t.i.d. 4. Flonase 2 squirts each nostril per day. 5. Nicotine patch 7 mg change daily. 6. Coumadin 3 mg p.o. with daily. 7. Aciphex 20 mg p.o. per day. 8. Calcium 1 tab p.o. per day. 9. Lipitor 10 mg p.o. per day. 10.Metoprolol 25 mg b.i.d. 11.Minipress 2 mg p.o. per day. 12.Niacin SR 500 mg p.o. per day.  Activity level is 24-hour supervision.  No strenuous activity, mobility with assistance only.  DIET:  D2 diet, thin liquids.  Full supervision.  SPECIAL INSTRUCTIONS:  [facility] to provide PT, OT, Speech Therapy, and RN home health, RN to draw pro-time on August 13, 2010, with results faxed to Dr. Sherwood Gambler.  FOLLOWUP:  The patient to follow up with Dr. Wynn Banker as needed. Follow up with Dr. Pearlean Brownie in 1 month.  Follow up with Dr. Sherwood Gambler in 2 weeks.     Delle Reining, P.A.   ______________________________ Erick Colace, M.D.   PL/MEDQ  D:  08/16/2010  T:  08/17/2010  Job:  [id number]  cc:   Madelin Rear. Sherwood Gambler,  MD Pramod P. Pearlean Brownie, MD  Electronically Signed by Osvaldo Shipper. on 08/22/2010 03:15:37 PM Electronically Signed by Claudette Laws M.D. on 08/26/2010 10:23:42 AM

## 2010-08-26 NOTE — H&P (Signed)
Andrea [name], Andrea [name]            ACCOUNT NO.:  [id number]  MEDICAL RECORD NO.:  [id number]  LOCATION:  4155                         FACILITY:  [facility]  PHYSICIAN:  Erick Colace, M.D.DATE OF BIRTH:  1935/03/27  DATE OF ADMISSION:  08/04/2010 DATE OF DISCHARGE:                             HISTORY & PHYSICAL   REASON FOR ADMISSION:  CVA.  HISTORY:  A 75 year old female with prior history of neuropathy causing bilateral foot drop.  She also has a history of atrial fibrillation. She was off from Coumadin because she was supratherapeutic on her INR and then was admitted to [facility] with aphasia and right- sided weakness and an INR of 1.09.  MRI, MRA of the brain showed diffusion restriction right parietal lobe.  No acute infarct.  Followup CT showed left MCA operculum infarct with edema.  Carotid Doppler showed diffuse heterogeneous plaque.  No stenosis.  Two-D echo showed an ejection fraction of 55-60% with no wall motion abnormalities.  The patient was started on IV heparin for presumed cardioembolic left MCA infarct.  She continues to exhibit aphasia and dysphagia.  Modified barium swallow performed August 01, 2010, showed delayed swallow anterior spillage mild residual, was placed on D1 honey thick liquids.  She has needed verbal and visual cues to follow commands.  She was able to answer yes/no questions today.  She has been receiving OT, PT and speech therapy.  Because of continued functional disabilities PM and R was consulted on August 03, 2010, and felt to be good rehab candidate.  REVIEW OF SYSTEMS:  Difficult to obtain secondary to aphasia but there was some questioning admits to weakness, numbness and insomnia. Previous functional status was walking but falling she had a leg fracture when she was visiting her daughter in [place].  She used a walker to ambulate, has had AFOs, but does not wear them.  She feels like it hinders her.  She does not wear shoes  anymore, uses "slipper socks."  PAST HISTORY: 1. AFib. 2. GERD. 3. CAD. 4. BP.  PAST SURGICAL HISTORY:  D and C, T and A, cholecystectomy, excision of skin cancer.  She had a melanoma excised from her face.  SOCIAL HISTORY:  Married, lives with her husband in one level home, ramp entry two steps inside.  Tobacco one pack per day.  Negative EtOH.  MEDICATIONS:  Norvasc, benazepril, Coumadin, niacin, Lunesta, metoprolol, Boniva.  ALLERGIES:  PENICILLIN, CODEINE, SULFA, MACROLIDE, ANTIBIOTICS, LOVENOX, IODINE.  LABORATORY DATA:  Hemoglobin 12.4, platelets 125,000, white count 5.8. INR 1.94.  PHYSICAL EXAMINATION:  VITAL SIGNS:  Blood pressure 156/82, pulse 108, respirations 16, temp 97.6. GENERAL:  Thin female in no acute distress.  She appears to be frustrated by her lack of communication.  She is able to state yes/no's. She is able to follow simple commands without visual cues.  Her speech is halting brace level, but poor articulation and nonsensical speech as well. HEENT:  Eyes anicteric, noninjected.  External ENT normal. NECK:  Supple without adenopathy. RESPIRATORY:  Effort is good.  Lungs are clear.  She has pectus excavatum deformity in her chest. HEART:  Regular rate and rhythm.  No rubs, murmurs or extra sounds. ABDOMEN:  Positive bowel sounds, soft, nontender to palpation. EXTREMITIES:  No clubbing, cyanosis.  She does have bilateral ankle edema. SKIN:  No significant breakdown.  She does have in the first PIP of the toe small abrasion, healed, look fine. NEURO:  She has decreased sensory bilateral feet.  She has a dense foot drop 0/5 strength.  She has 3- at bilateral quads and 3- bilateral hip flexors.  Upper extremity strength is 5 in the left deltoid, biceps, triceps and 4 in the right deltoid, biceps, triceps, 3 at the right grip and 4 at the left grip.  She has intrinsic atrophy in bilateral hands and feet.  She has no evidence of fasciculation.  She has  decreased deep tendon reflexes bilaterally.  POST ADMISSION PHYSICIAN EVALUATION: 1. Functional deficits secondary to left MCA distribution infarct with     right hemiparesis, dysarthria, dysphagia and aphasia.  She has     chronic bilateral foot drop as well as hand intrinsic weakness due     to chronic neuropathy. 2. The patient admitted to receive collaborative interdisciplinary     care between the physiatrist, rehab nursing staff, and therapy     team. 3. The patient's level of medical complexity and substantial therapy     needs in context of that medical necessity cannot be provided at a     lesser intensity of care. 4. The patient has experienced substantial functional loss from her     baseline.  Upon functional assessment at the time of preadmission     screening, the patient was at a  min assist level bed mobility, max     assist transfers, min to max with dressing and bathing.  Upon     functional evaluation today, the patient was a min to mod level bed     mobility, +2 total 50% transfers greater than 4 feet rolling     walker, +2 total 50% min assist upper body dressing, min assist     lower body dressing, min assist grooming, min assist toileting, min     assist eating and drinking, +2 total for toilet and 50% with     transfers.  Judging by the patient's physical exam, diagnosis and     functional history, the patient has a potential to make functional     gains which will result in measurable gains while on inpatient     rehab.  These gains will be of substantial and practical use upon     discharge to home in facilitating mobility, self-care and     independence.  Interim changes of medical status since preadmission     screening are detailed in the history of present illness. 5. Physiatrist will provide 24-hour management of medical needs as     well as oversight of therapy plan/treatment and provide guidance     appropriate regarding interactions of the two. 6. A  24-hour rehab nursing will assess in the management of skin,     bowel, bladder and help integrate therapy concepts, techniques,     education. 7. PT will assess and treat for foot drop including orthotic     management., gait training endurance, safety, goals are for a     supervision ability level. 8. OT will assess and treat for ADLs, cognitive perceptual skills,     neuromuscular reeducation, equipment, goals are for a supervision     level with all ADLs. 9. Speech Language Pathology will assess and treat language,  cognition, swallowing.  Goals are for safe p.o. intake 100% caloric     and fluid needs with unrestricted diet. 10.Case management and social work will assess and treat for     psychosocial issues and discharge planning. 11.Team conference will be held weekly to assess the patient's     progress/goals and to determine barriers to discharge. 12.The patient has demonstrated sufficient medical stability and     exercise capacity to tolerate at least 3 hours of therapy per day     at least 5 days per week. 13.Estimated length of stay is 10-14 days.  Prognosis for further     functional improvement is good.  MEDICAL PROBLEM LIST AND PLAN: 1. DVT prophylaxis Coumadin. 2. AFib continue Coumadin rate control with beta blocker. 3. Hypertension, monitor on Lopressor, Lotensin, Minipress, off of     hydralazine, Norvasc. 4. Chronic neuropathy.  Trial of AFOs.  No further workup needed at     this time. 5. Insomnia.  Ambien __________ effective for now.  Motivation and mood appear to be adequate for inpatient rehabilitation. Discussed Rehab Medicine Service with the patient and family, answered questions.     Erick Colace, M.D.     AEK/MEDQ  D:  08/04/2010  T:  08/05/2010  Job:  [id number]  Electronically Signed by Claudette Laws M.D. on 08/26/2010 10:23:36 AM

## 2010-10-03 ENCOUNTER — Encounter: Payer: Medicare Other | Attending: Physical Medicine & Rehabilitation

## 2010-10-03 ENCOUNTER — Ambulatory Visit: Payer: Medicare Other | Admitting: Physical Medicine & Rehabilitation

## 2010-10-03 DIAGNOSIS — K5900 Constipation, unspecified: Secondary | ICD-10-CM | POA: Insufficient documentation

## 2010-10-03 DIAGNOSIS — G8100 Flaccid hemiplegia affecting unspecified side: Secondary | ICD-10-CM

## 2010-10-03 DIAGNOSIS — I69920 Aphasia following unspecified cerebrovascular disease: Secondary | ICD-10-CM | POA: Insufficient documentation

## 2010-10-03 DIAGNOSIS — G609 Hereditary and idiopathic neuropathy, unspecified: Secondary | ICD-10-CM | POA: Insufficient documentation

## 2010-10-03 DIAGNOSIS — R279 Unspecified lack of coordination: Secondary | ICD-10-CM | POA: Insufficient documentation

## 2010-10-03 DIAGNOSIS — H6120 Impacted cerumen, unspecified ear: Secondary | ICD-10-CM | POA: Insufficient documentation

## 2010-10-03 DIAGNOSIS — I69921 Dysphasia following unspecified cerebrovascular disease: Secondary | ICD-10-CM

## 2010-10-03 DIAGNOSIS — M216X9 Other acquired deformities of unspecified foot: Secondary | ICD-10-CM | POA: Insufficient documentation

## 2010-10-03 DIAGNOSIS — R29898 Other symptoms and signs involving the musculoskeletal system: Secondary | ICD-10-CM | POA: Insufficient documentation

## 2010-10-03 NOTE — Assessment & Plan Note (Signed)
REASON FOR VISIT:  Problems with speech.  A 75 year old female who had a left MCA distribution infarct causing right hemiparesis and aphasia.  On August 04, 2010, through around June 2012, she complains of constipation that has improved since I last saw her.  She continues to have problems with her speech, although she feels like it is gotten better.  Her functional status is independent with dressing, needs assist with bathing.  She uses a wheelchair mainly in the home.  She has multiple ramps.  She can use a walker.  Prior to her stroke, she had severe polyneuropathy using a walker for ambulation anyway, but despite recommendations did not use AFOs.  She needs assist with bathing, household duties as noted above.  She has bladder control problems, weakness, confusion, urinary problems, coughing.  PAST HISTORY:  Hypertension.  HABIT:  Smokes a pack per day or a little bit less per her report.  I discussed with her that quitting smoking would reduce her stroke risk by about 50% which is even better than medication management such as aspirin and probably even a little bit more profound effect than warfarin.  Certainly, she needs to continue on warfarin as she is doing and her INR has been therapeutic recently.  PHYSICAL EXAMINATION:  VITAL SIGNS:  Her blood pressure is 96/43, pulse 63, respirations 18, O2 sat 99% on room air. MUSCULOSKELETAL:  Her strength is 4/5 in the proximal right upper and right lower extremity.  In the distal extremities such as finger flexor, extension, and foot ankle dorsiflexor, plantar flexor and she is symmetrically weak.  Her ankle dorsiflexion, plantar flexion is 0/5 bilaterally, in the hand, she is 3+/5 bilaterally.  Her speech is dysfluent.  She has difficulty with sentence length, communication, but she is able to name objects quite easily as well as repeat sentences.  IMPRESSION:  Left middle cerebral artery distribution infarct.  She still has  some residual right hemiparesis as well as primarily expressive aphasia.  PLAN:  Recommend outpatient PT as well as outpatient speech.  She is still receiving some home health speech.  I will see her back in 1 month followup.  Reiterate smoking cessation.  Continue on warfarin as per Cardiology and primary care.     Erick Colace, M.D. Electronically Signed    AEK/MedQ D:  10/03/2010 12:52:20  T:  10/03/2010 21:28:26  Job #:  [id number]  cc:   Nanetta Batty, M.D. Fax: [phone]  Madelin Rear. Sherwood Gambler, MD Fax: [phone]

## 2010-10-12 ENCOUNTER — Ambulatory Visit (HOSPITAL_COMMUNITY): Payer: Medicare Other | Admitting: Physical Therapy

## 2010-10-18 ENCOUNTER — Ambulatory Visit (HOSPITAL_COMMUNITY)
Admission: RE | Admit: 2010-10-18 | Discharge: 2010-10-18 | Disposition: A | Discharge status: Home / Self Care | Payer: Medicare Other | Source: Ambulatory Visit | Attending: Physical Medicine & Rehabilitation | Admitting: Physical Medicine & Rehabilitation

## 2010-10-18 DIAGNOSIS — IMO0001 Reserved for inherently not codable concepts without codable children: Secondary | ICD-10-CM | POA: Insufficient documentation

## 2010-10-18 DIAGNOSIS — R4789 Other speech disturbances: Secondary | ICD-10-CM | POA: Insufficient documentation

## 2010-10-18 NOTE — Progress Notes (Addendum)
Speech Language Pathology Evaluation Patient Details  Name: Andrea [name] MRN: [id number] Date of Birth: 06-11-1935  Today's Date: 10/18/2010 Time: 1115-1208 Time Calculation (min): 53 min  Past Medical History:  Past Medical History  Diagnosis Date  . Hypertension    Past Surgical History: No past surgical history on file. HPI:  Symptoms/Limitations Symptoms: "I have a hard time speaking clearly at times." Pain Assessment Currently in Pain?: No/denies Multiple Pain Sites: No  Prior Functional Status  Cognitive/Linguistic Baseline: Within functional limits Primary Language: English Type of Home: House Lives With: Spouse Receives Help From: Family Education: Attended college in [place] Vocation: On disability (Retired 1971 from [employer])  Cognition   [facility]  Comprehension  Auditory Comprehension Yes/No Questions: Within Functional Limits Commands: Within Functional Limits Conversation: Complex Visual Recognition/Discrimination Discrimination: Within Function Limits Reading Comprehension Reading Status: Not tested (Pt reports decreased reading comprehension with her novels)  Expression  Expression Primary Mode of Expression: Verbal Verbal Expression Initiation: No impairment Level of Generative/Spontaneous Verbalization: Conversation Repetition: No impairment Naming: No impairment Pragmatics: No impairment Interfering Components: Speech intelligibility Non-Verbal Means of Communication: Not applicable Other Verbal Expression Comments: Overall min dysarthria negatively impacting speech intelligibility at the conversation level with novel conversation partners and/or and limited context provided. Written Expression Dominant Hand: Right Written Expression: Within Functional Limits  Oral/Motor  Oral Motor/Sensory Function Labial ROM: Reduced right (minimally reduced) Labial Symmetry: Abnormal symmetry right Labial Strength: Within Functional Limits Labial  Sensation: Within Functional Limits Lingual ROM: Reduced right Lingual Symmetry: Within Functional Limits Lingual Strength: Reduced Lingual Sensation: Within Functional Limits Facial ROM: Within Functional Limits Facial Symmetry: Within Functional Limits Facial Strength: Within Functional Limits Facial Sensation: Within Functional Limits Velum: Within Functional Limits Mandible: Within Functional Limits Motor Speech Intelligibility: Intelligibility reduced  SLP Goals  Home Exercise SLP Goal: Patient will Perform Home Exercise Program: Independently SLP Short Term Goals SLP Short Term Goal 1: Pt will increase accuracy of speech production of multisyllabic words with problem area sounds to >90% with min cues.  SLP Short Term Goal 2: Pt will increase awareness and attempt to correct episodes of reduced intelligibility during conversation with less than 2 cues. SLP Short Term Goal 3: Pt will complete OMEs on a daily basis with written cues provided. SLP Short Term Goal 4: Pt will increase recall of page-length reading material to >90% with allowance for re-reading material and min cues. SLP Long Term Goals SLP Long Term Goal 1: Same as above.  Assessment/Plan  Patient Active Problem List  Diagnoses  . ANKLE SPRAIN, LEFT  . CONTUSION, LOWER LEG, LEFT   SLP - End of Session Activity Tolerance: Patient tolerated treatment well General Behavior During Session: [facility] for tasks performed Cognition: [facility] for tasks performed  SLP Assessment/Plan Clinical Impression Statement: Min dysarthria negatively impacting speech intelligibility at the conversation level with novel conversation partners and when limited context is provided to listener. Pt's husband is very supportive and feels that she continually makes progress.. Pt. also reports decreased reading comprehension with lengthy material- will assess further. Speech Therapy Frequency: min 2x/week Duration: 4 weeks Treatment/Interventions:  SLP instruction and feedback;Compensatory strategies;Patient/family education;Functional tasks;Cueing hierarchy (oral motor exercises) Potential to Achieve Goals: Good Potential Considerations: Other (comment) (Pt has U/L dentures which may impact lingual feedback )  PORTER,DABNEY 10/18/2010, 6:45 PM  Physician Treatment Plan  Your signature is required to indicate approval of the treatment plan/progress as stated above. Please make a copy of this report for your files and return this physician signed original in the self-addressed envelope or fax to [phone]. COMMENTS/CHANGES:__________________________________________________________________________________________________________________________   ____________________________________                 ____________________ Benjamin Stain                                                    DATE

## 2010-10-20 ENCOUNTER — Ambulatory Visit (HOSPITAL_COMMUNITY)
Admission: RE | Admit: 2010-10-20 | Discharge: 2010-10-20 | Disposition: A | Discharge status: Home / Self Care | Payer: Medicare Other | Source: Ambulatory Visit | Attending: Physical Medicine & Rehabilitation | Admitting: Physical Medicine & Rehabilitation

## 2010-10-20 NOTE — Progress Notes (Signed)
Speech Language Pathology Treatment Patient Details  Name: Andrea [name] MRN: [id number] Date of Birth: [birth year]  Today's Date: 10/20/2010 Time: 1400-1430 Time Calculation (min): 30 min  HPI:  Symptoms/Limitations Symptoms: "I didn't have a chance to practice my words at home." "Do you think my speech will ever be the way it was?" Pain Assessment Currently in Pain?: No/denies Multiple Pain Sites: No   Treatment  Dysarthria Therapy Oral Motor Exercises Patient/Family Education Home Exercise Program  SLP Goals  Home Exercise SLP Goal: Patient will Perform Home Exercise Program: Independently SLP Goal: Perform Home Exercise Program - Progress: Progressing toward goal SLP Short Term Goals SLP Short Term Goal 1: Pt will increase accuracy of speech production of multisyllabic words with problem area sounds to >90% with min cues.  SLP Short Term Goal 1 - Progress: Progressing toward goal SLP Short Term Goal 2: Pt will increase awareness and attempt to correct episodes of reduced intelligibility during conversation with less than 2 cues. SLP Short Term Goal 2 - Progress: Progressing toward goal SLP Short Term Goal 3: Pt will complete OMEs on a daily basis with written cues provided. SLP Short Term Goal 3 - Progress: Progressing toward goal SLP Short Term Goal 4: Pt will increase recall of page-length reading material to >90% with allowance for re-reading material and min cues. SLP Short Term Goal 4 - Progress: Progressing toward goal SLP Long Term Goals SLP Long Term Goal 1: Same as above.  Assessment/Plan  Patient Active Problem List  Diagnoses  . ANKLE SPRAIN, LEFT  . CONTUSION, LOWER LEG, LEFT  . Dysarthria following cerebrovascular accident   SLP - End of Session Activity Tolerance: Patient tolerated treatment well General Behavior During Session: [facility] for tasks performed Cognition: [facility] for tasks performed  SLP Assessment/Plan Clinical Impression Statement: Recorded  patient for speech sample today of the "[place]". Pt reviewed and stated that she didn't sound too bad, but that it was still different. Speech was slow, but otherwise intelligeable >90%. Intelligibility of multisyllabic words was ~90%. Speech Therapy Frequency: min 2x/week Duration: 4 weeks Treatment/Interventions: SLP instruction and feedback;Compensatory strategies;Patient/family education;Functional tasks;Cueing hierarchy (oral motor exercises) Potential to Achieve Goals: Good Potential Considerations: Other (comment) (Pt has U/L dentures which may impact lingual feedback )  Assigned reading comprehension paragraphs for homework.  Havery Moros, CCC-SLP  PORTER,DABNEY 10/20/2010, 9:01 PM

## 2010-10-25 ENCOUNTER — Ambulatory Visit (HOSPITAL_COMMUNITY)
Admission: RE | Admit: 2010-10-25 | Discharge: 2010-10-25 | Disposition: A | Discharge status: Home / Self Care | Payer: Medicare Other | Source: Ambulatory Visit | Attending: Internal Medicine | Admitting: Internal Medicine

## 2010-10-25 DIAGNOSIS — IMO0001 Reserved for inherently not codable concepts without codable children: Secondary | ICD-10-CM | POA: Insufficient documentation

## 2010-10-25 DIAGNOSIS — R4789 Other speech disturbances: Secondary | ICD-10-CM | POA: Insufficient documentation

## 2010-10-25 NOTE — Progress Notes (Signed)
Speech Language Pathology Treatment Patient Details  Name: HOLIDAY MCMENAMIN MRN: [id number] Date of Birth: 03-14-1935  Today's Date: 10/25/2010 Time: 1345-1430 Time Calculation (min): 45 min  HPI:  Symptoms/Limitations Symptoms: "My daughter is here and she was amazed how well I'm talking." Special Tests: N/A Pain Assessment Currently in Pain?: No/denies Multiple Pain Sites: No    Treatment  Cognitive-Linguistic Therapy- reading comprehension/recall Dysarthria Therapy Oral Motor Exercises Patient/Family Education Home Exercise Program   SLP Goals  Home Exercise SLP Goal: Patient will Perform Home Exercise Program: Independently SLP Goal: Perform Home Exercise Program - Progress: Progressing toward goal SLP Short Term Goals SLP Short Term Goal 1: Pt will increase accuracy of speech production of multisyllabic words with problem area sounds to >90% with min cues.  SLP Short Term Goal 1 - Progress: Progressing toward goal SLP Short Term Goal 2: Pt will increase awareness and attempt to correct episodes of reduced intelligibility during conversation with less than 2 cues. SLP Short Term Goal 2 - Progress: Progressing toward goal SLP Short Term Goal 3: Pt will complete OMEs on a daily basis with written cues provided. SLP Short Term Goal 3 - Progress: Progressing toward goal SLP Short Term Goal 4: Pt will increase recall of page-length reading material to >90% with allowance for re-reading material and min cues. SLP Short Term Goal 4 - Progress: Progressing toward goal SLP Long Term Goals SLP Long Term Goal 1: Same as above.  Assessment/Plan  Patient Active Problem List  Diagnoses  . ANKLE SPRAIN, LEFT  . CONTUSION, LOWER LEG, LEFT  . Dysarthria following cerebrovascular accident   SLP - End of Session Activity Tolerance: Patient tolerated treatment well General Behavior During Session: [facility] for tasks performed Cognition: [facility] for tasks performed  SLP  Assessment/Plan Clinical Impression Statement: Mrs. Pujol was very excited to have her daughter visiting and her daughter was pleased with her progress. Oral motor exercises were reveiwed and completed in front of mirror for feedback. She required moderate cues for completion so I question follow through at home. Speech slow, but intelligeable except for ~5 self corrections today. Speech Therapy Frequency: min 2x/week Duration: 4 weeks Treatment/Interventions: SLP instruction and feedback;Compensatory strategies;Patient/family education;Functional tasks;Cueing hierarchy (oral motor exercises) Potential to Achieve Goals: Good Potential Considerations: Other (comment) (Pt has U/L dentures which may impact lingual feedback )  Guila Owensby 10/25/2010, 7:26 PM

## 2010-10-31 ENCOUNTER — Ambulatory Visit (HOSPITAL_COMMUNITY)
Admission: RE | Admit: 2010-10-31 | Discharge: 2010-10-31 | Disposition: A | Discharge status: Home / Self Care | Payer: Medicare Other | Source: Ambulatory Visit | Attending: Internal Medicine | Admitting: Internal Medicine

## 2010-10-31 NOTE — Progress Notes (Signed)
Speech Language Pathology Treatment Patient Details  Name: Andrea [name] MRN: [id number] Date of Birth: 01-30-1936  Today's Date: 10/31/2010 Time: 1302-1358 Time Calculation (min): 56 min  HPI:  Symptoms/Limitations Symptoms: "I'm going very well." Special Tests: N/A Pain Assessment Currently in Pain?: No/denies Multiple Pain Sites: No   Treatment  Dysarthria Therapy Oral Motor Exercises Patient/Family Education Home Exercise Program  SLP Goals  Home Exercise SLP Goal: Patient will Perform Home Exercise Program: Independently SLP Goal: Perform Home Exercise Program - Progress: Progressing toward goal SLP Short Term Goals SLP Short Term Goal 1: Pt will increase accuracy of speech production of multisyllabic words with problem area sounds to >90% with min cues.  SLP Short Term Goal 1 - Progress: Met SLP Short Term Goal 2: Pt will increase awareness and attempt to correct episodes of reduced intelligibility during conversation with less than 2 cues. SLP Short Term Goal 2 - Progress: Met SLP Short Term Goal 3: Pt will complete OMEs on a daily basis with written cues provided. SLP Short Term Goal 3 - Progress: Progressing toward goal SLP Short Term Goal 4: Pt will increase recall of page-length reading material to >90% with allowance for re-reading material and min cues. SLP Short Term Goal 4 - Progress: Met SLP Long Term Goals SLP Long Term Goal 1: Same as above. SLP Long Term Goal 1 - Progress: Progressing toward goal  Assessment/Plan  Patient Active Problem List  Diagnoses  . ANKLE SPRAIN, LEFT  . CONTUSION, LOWER LEG, LEFT  . Dysarthria following cerebrovascular accident   SLP - End of Session Activity Tolerance: Patient tolerated treatment well General Behavior During Session: [facility] for tasks performed Cognition: [facility] for tasks performed  SLP Assessment/Plan Clinical Impression Statement: [name] reported that she had a very nice visit with her son and  daughter and that they both felt like her speech had improved. Conversation was 95%+ intelligible today. Pt required extra time and rehearsal to pronounce mulitsyllabic "r" words today. She was given tongue twisters to practice at home in addition to her OMEs. Will see patient Thursday and likely D/C. Speech Therapy Frequency: min 2x/week Duration: One additional visit Treatment/Interventions: SLP instruction and feedback;Compensatory strategies;Patient/family education;Functional tasks;Cueing hierarchy (oral motor exercises) Potential to Achieve Goals: Good Potential Considerations: Other (comment) (Pt has U/L dentures which may impact lingual feedback )  PORTER,DABNEY 10/31/2010, 2:57 PM

## 2010-11-01 ENCOUNTER — Ambulatory Visit: Payer: Medicare Other | Admitting: Physical Medicine & Rehabilitation

## 2010-11-01 ENCOUNTER — Encounter: Payer: Medicare Other | Attending: Physical Medicine & Rehabilitation

## 2010-11-01 DIAGNOSIS — G609 Hereditary and idiopathic neuropathy, unspecified: Secondary | ICD-10-CM | POA: Insufficient documentation

## 2010-11-01 DIAGNOSIS — G8110 Spastic hemiplegia affecting unspecified side: Secondary | ICD-10-CM

## 2010-11-01 DIAGNOSIS — K5900 Constipation, unspecified: Secondary | ICD-10-CM | POA: Insufficient documentation

## 2010-11-01 DIAGNOSIS — I69920 Aphasia following unspecified cerebrovascular disease: Secondary | ICD-10-CM | POA: Insufficient documentation

## 2010-11-01 DIAGNOSIS — H6120 Impacted cerumen, unspecified ear: Secondary | ICD-10-CM | POA: Insufficient documentation

## 2010-11-01 DIAGNOSIS — R29898 Other symptoms and signs involving the musculoskeletal system: Secondary | ICD-10-CM | POA: Insufficient documentation

## 2010-11-01 DIAGNOSIS — I69921 Dysphasia following unspecified cerebrovascular disease: Secondary | ICD-10-CM

## 2010-11-01 DIAGNOSIS — R279 Unspecified lack of coordination: Secondary | ICD-10-CM | POA: Insufficient documentation

## 2010-11-01 DIAGNOSIS — M216X9 Other acquired deformities of unspecified foot: Secondary | ICD-10-CM | POA: Insufficient documentation

## 2010-11-01 NOTE — Assessment & Plan Note (Signed)
A 75 year old female who had left MCA distribution infarct causing right hemiparesis aphasia.  She initially had dysphagia as well.  Her onset of stroke was early June.  She came to rehab August 04, 2010, and stayed until August 13, 2010.  She has a history of chronic foot drop due to neuropathy.  She has been receiving first home health therapy and now has graduated is going to outpatient therapy at [facility].  Her speech therapy continues, but thinks that it may discontinue in a week or two.  She has had no new medical problems in the interval time.  No falls.  He continues to smoke intermittently.  I once again cautioned her against this as a potential stroke risk factor.  Examination, she is able to repeat no ifs, ands or buts.  She can name simple objects.  She still has some difficulty with producing sentence length material, but short phrases as well as words are more fluent.  Her strength is 4/5 in the proximal right upper and the right lower extremity distal finger flexors, finger extensors are 3+ in the ankle plantar flexors and dorsiflexors 0.  IMPRESSION: 1. 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 2. Chronic neuropathy with foot drop and hand weakness.  PLAN: 1. Continue outpatient speech therapy. 2. I will see her back in about 2 months' time.  She will be about 6     months post-stroke at that point should be nearing plateau from the     physical standpoint, although she still may have not reached a     plateau in terms of her speech.     Erick Colace, M.D. Electronically Signed    AEK/MedQ D:  11/01/2010 16:07:07  T:  11/01/2010 22:35:39  Job #:  [id number]

## 2010-11-03 ENCOUNTER — Ambulatory Visit (HOSPITAL_COMMUNITY)
Admission: RE | Admit: 2010-11-03 | Discharge: 2010-11-03 | Disposition: A | Discharge status: Home / Self Care | Payer: Medicare Other | Source: Ambulatory Visit | Attending: Internal Medicine | Admitting: Internal Medicine

## 2010-11-03 NOTE — Progress Notes (Signed)
Speech Language Pathology Discharge Summary Patient Details  Name: Andrea [name] MRN: [id number] Date of Birth: 04-Dec-1935 Sessions to Date: Evaluation + 4 treatment  Today's Date: 11/03/2010 Time: 1324-4010 Time Calculation (min): 32 min  HPI:  Symptoms/Limitations Symptoms: "I'm going to miss you." Special Tests: N/A Pain Assessment Currently in Pain?: No/denies Multiple Pain Sites: No   Treatment  Dysarthria Therapy Oral Motor Exercises Patient/Family Education Home Exercise Program  SLP Goals  Home Exercise SLP Goal: Patient will Perform Home Exercise Program: Independently SLP Goal: Perform Home Exercise Program - Progress: Met SLP Short Term Goals SLP Short Term Goal 1: Pt will increase accuracy of speech production of multisyllabic words with problem area sounds to >90% with min cues.  SLP Short Term Goal 1 - Progress: Met SLP Short Term Goal 2: Pt will increase awareness and attempt to correct episodes of reduced intelligibility during conversation with less than 2 cues. SLP Short Term Goal 2 - Progress: Met SLP Short Term Goal 3: Pt will complete OMEs on a daily basis with written cues provided. SLP Short Term Goal 3 - Progress: Met SLP Short Term Goal 4: Pt will increase recall of page-length reading material to >90% with allowance for re-reading material and min cues. SLP Short Term Goal 4 - Progress: Met SLP Long Term Goals SLP Long Term Goal 1: Same as above.  SLP - End of Session Activity Tolerance: Patient tolerated treatment well General Behavior During Session: [facility] for tasks performed Cognition: [facility] for tasks performed  SLP Assessment/Plan Clinical Impression Statement: Andrea [name] is independent in her home exercise program and has met all of her goals. While her speech is not 100% intelligible, she is aware of dysarthric errors and able to correct in conversation. She recognizes that she generally has to speak slower to more accurately pronounce  multisyllabic words. She hopes that her speech will continue to become more natural and given the progress she has made thus far, her speech will continue to improve over time. She has been a pleasure to work with and is being discharged to a home program. Thank you for this referral.  Speech Therapy Frequency:  (DC to home program) Treatment/Interventions: SLP instruction and feedback;Compensatory strategies;Patient/family education;Functional tasks;Cueing hierarchy (oral motor exercises) Potential Considerations:  (Pt has U/L dentures which may impact lingual feedback )  Havery Moros, CCC-SLP  [name] 11/03/2010, 5:51 PM

## 2010-11-17 LAB — BASIC METABOLIC PANEL
BUN: 2 — ABNORMAL LOW
BUN: 2 — ABNORMAL LOW
BUN: 2 — ABNORMAL LOW
BUN: 3 — ABNORMAL LOW
BUN: 3 — ABNORMAL LOW
BUN: 4 — ABNORMAL LOW
BUN: 4 — ABNORMAL LOW
BUN: 4 — ABNORMAL LOW
BUN: 5 — ABNORMAL LOW
CO2: 24
CO2: 25
CO2: 26
CO2: 28
CO2: 28
CO2: 31
Calcium: 8.1 — ABNORMAL LOW
Calcium: 8.7
Calcium: 8.7
Calcium: 8.9
Calcium: 8.9
Calcium: 9
Chloride: 69 — CL
Chloride: 88 — ABNORMAL LOW
Creatinine, Ser: 0.42
Creatinine, Ser: 0.47
Creatinine, Ser: 0.5
Creatinine, Ser: 0.51
Creatinine, Ser: 0.51
Creatinine, Ser: 0.52
Creatinine, Ser: 0.6
GFR calc Af Amer: >60
GFR calc Af Amer: >60
GFR calc Af Amer: >60
GFR calc non Af Amer: >60
GFR calc non Af Amer: >60
GFR calc non Af Amer: >60
GFR calc non Af Amer: >60
GFR calc non Af Amer: >60
Glucose, Bld: 102 — ABNORMAL HIGH
Glucose, Bld: 108 — ABNORMAL HIGH
Glucose, Bld: 111 — ABNORMAL HIGH
Glucose, Bld: 81
Glucose, Bld: 83
Glucose, Bld: 88
Glucose, Bld: 99
Potassium: 3.6
Potassium: 3.8
Potassium: 3.9
Sodium: 108 — CL
Sodium: 135

## 2010-11-17 LAB — COMPREHENSIVE METABOLIC PANEL
ALT: 20
AST: 34
Alkaline Phosphatase: 77
CO2: 25
Chloride: 97
GFR calc Af Amer: >60
GFR calc non Af Amer: >60
Glucose, Bld: 94
Potassium: 4.4
Sodium: 130 — ABNORMAL LOW
Total Bilirubin: 0.9

## 2010-11-17 LAB — OSMOLALITY, URINE: Osmolality, Ur: 330 — ABNORMAL LOW

## 2010-11-17 LAB — URINALYSIS, ROUTINE W REFLEX MICROSCOPIC
Bilirubin Urine: NEGATIVE
Bilirubin Urine: NEGATIVE
Glucose, UA: NEGATIVE
Hgb urine dipstick: NEGATIVE
Ketones, ur: NEGATIVE
Ketones, ur: NEGATIVE
Nitrite: NEGATIVE
Protein, ur: NEGATIVE
Specific Gravity, Urine: 1.01
Specific Gravity, Urine: <1.005 — ABNORMAL LOW
Urobilinogen, UA: 0.2
Urobilinogen, UA: 0.2
pH: 5.5
pH: 5

## 2010-11-17 LAB — SEDIMENTATION RATE: Sed Rate: 17

## 2010-11-17 LAB — TSH: TSH: 1.438

## 2010-11-17 LAB — DIFFERENTIAL
Basophils Absolute: 0
Basophils Absolute: 0
Basophils Absolute: 0
Basophils Absolute: 0
Basophils Relative: 0
Basophils Relative: 0
Basophils Relative: 0
Basophils Relative: 1
Eosinophils Absolute: 0
Eosinophils Absolute: 0
Eosinophils Absolute: 0.3
Eosinophils Relative: 0
Eosinophils Relative: 4
Lymphocytes Relative: 43
Lymphs Abs: 2.6
Monocytes Absolute: 0.8
Neutro Abs: 4.8
Neutrophils Relative %: 52
Neutrophils Relative %: 58
Neutrophils Relative %: 74

## 2010-11-17 LAB — VITAMIN B12: Vitamin B-12: 343 (ref 211–911)

## 2010-11-17 LAB — CBC
HCT: 31.8 — ABNORMAL LOW
MCHC: 34.5
MCHC: 35.6
MCV: 90.2
Platelets: 206
Platelets: 223
Platelets: 230
Platelets: 241
RDW: 12.8
RDW: 13.1
RDW: 13.1

## 2010-11-17 LAB — PROTIME-INR
INR: 1.5
INR: 1.7 — ABNORMAL HIGH
INR: 2.4 — ABNORMAL HIGH
Prothrombin Time: 20.4 — ABNORMAL HIGH
Prothrombin Time: 21 — ABNORMAL HIGH
Prothrombin Time: 21.1 — ABNORMAL HIGH
Prothrombin Time: 27.5 — ABNORMAL HIGH

## 2010-11-17 LAB — OSMOLALITY: Osmolality: 223 — ABNORMAL LOW

## 2010-11-17 LAB — SODIUM, URINE, RANDOM: Sodium, Ur: 104

## 2010-11-17 LAB — CARDIOLIPIN ANTIBODY: Phospholipids: 134 mg/dL — ABNORMAL LOW (ref 150–380)

## 2010-12-29 ENCOUNTER — Ambulatory Visit: Payer: Medicare Other | Admitting: Physical Medicine & Rehabilitation

## 2010-12-29 ENCOUNTER — Encounter: Payer: Medicare Other | Attending: Physical Medicine & Rehabilitation

## 2010-12-29 DIAGNOSIS — I69920 Aphasia following unspecified cerebrovascular disease: Secondary | ICD-10-CM | POA: Insufficient documentation

## 2010-12-29 DIAGNOSIS — R279 Unspecified lack of coordination: Secondary | ICD-10-CM | POA: Insufficient documentation

## 2010-12-29 DIAGNOSIS — R29898 Other symptoms and signs involving the musculoskeletal system: Secondary | ICD-10-CM | POA: Insufficient documentation

## 2010-12-29 DIAGNOSIS — H6120 Impacted cerumen, unspecified ear: Secondary | ICD-10-CM | POA: Insufficient documentation

## 2010-12-29 DIAGNOSIS — K5900 Constipation, unspecified: Secondary | ICD-10-CM | POA: Insufficient documentation

## 2010-12-29 DIAGNOSIS — G609 Hereditary and idiopathic neuropathy, unspecified: Secondary | ICD-10-CM | POA: Insufficient documentation

## 2010-12-29 DIAGNOSIS — M216X9 Other acquired deformities of unspecified foot: Secondary | ICD-10-CM | POA: Insufficient documentation

## 2010-12-30 ENCOUNTER — Ambulatory Visit: Payer: Medicare Other | Admitting: Physical Medicine & Rehabilitation

## 2010-12-30 DIAGNOSIS — G8110 Spastic hemiplegia affecting unspecified side: Secondary | ICD-10-CM

## 2011-01-03 NOTE — Assessment & Plan Note (Signed)
CHIEF COMPLAINT:  Left hand tingling.  HISTORY OF PRESENT ILLNESS:  This is a 75 year old female who has a history of peripheral neuropathy affecting bilateral feet greater than hands due to a toxic effect of medication.  This is chronic greater than 10 years ago.  More recently, she has had a left MCA distribution infarct with right hemiparesis and aphasia as well as dysphasia.  Onset of stroke was June 2012.  Came to the Rehab Unit on June 14 and stayed until August 13, 2010.  She was going to outpatient therapy at [facility], but this has discontinued since her last visit on November 01, 2010.  She has had no new medical problems in the interval time.  She has an area under her chest that she wants to show me that has been draining.  REVIEW OF SYSTEMS:  Positive bladder control problems, weakness, numbness, trouble walking, confusion, depression, urine retention, weight loss, poor appetite.  MEDICATIONS:  She is taking docusate sodium on a p.r.n. basis.  Her other meds include benazepril, Lipitor, metoprolol, Minipress, Prevacid, warfarin, niacin, Cal-Citrate.  ALLERGIES:  IODINE DYE, SULFA, and PENICILLIN.  SOCIAL HISTORY:  Lives with her husband.  Smokes a 1/2-pack per day or less.  PHYSICAL EXAMINATION:  VITAL SIGNS:  Blood pressure 100/52, pulse 52, respirations 16, O2 saturation 98% on room air. GENERAL:  No acute distress.  Mood and affect appropriate.  Her back has no tenderness to palpation.  She has a speech at the short phrase level. She can actually go longer but then as she speeds up she makes more errors and has decreased speech intelligibility.  Her comprehension appears intact.  Her naming is intact including stethoscope. NEUROLOGIC:  Right upper extremity strength is 4/5, bilateral hand strength is 4/5 in the finger flexors.  She has 0/5 ankle dorsiflexors bilaterally.  She has 3+ and ankle plantar flexors and 4 in bilateral hip flexors, knee  extensors.  IMPRESSION: 1. Left middle cerebral artery distribution infarct with residual     expressive aphasia as well as some mild right upper extremity     weakness. 2. Chronic bilateral foot drop related to her history of peripheral     neuropathy.  PLAN: 1. I really do not think she needs to continue anymore speech therapy.     She is starting to plateau.  She is about 6 months postulant.  With     time, she may regain some further function. 2. Chronic neuropathy, left hand pain.  I think she is having some     neuropathic symptoms in the hand, although I did check her for     carpal tunnel and certainly negative Tinel's, negative Phalen's,     negative median nerve distribution, numbness.  So, I do not think     EMG would be particularly revealing. 3. Discussed with the patient and husband who agree with plan.  I will     see her back on a p.r.n. basis.  Follow up medically with Dr. Sherwood Gambler     in [facility].     Erick Colace, M.D. Electronically Signed    AEK/MedQ D:  12/30/2010 14:03:20  T:  12/30/2010 16:29:49  Job #:  [id number]

## 2011-04-29 ENCOUNTER — Other Ambulatory Visit: Payer: Self-pay

## 2011-04-29 ENCOUNTER — Emergency Department (HOSPITAL_COMMUNITY): Payer: Medicare Other

## 2011-04-29 ENCOUNTER — Encounter (HOSPITAL_COMMUNITY): Payer: Self-pay

## 2011-04-29 ENCOUNTER — Inpatient Hospital Stay (HOSPITAL_COMMUNITY)
Admission: EM | Admit: 2011-04-29 | Discharge: 2011-05-02 | DRG: 689 | Disposition: A | Discharge status: Home Health | Payer: Medicare Other | Attending: Internal Medicine | Admitting: Internal Medicine

## 2011-04-29 DIAGNOSIS — N390 Urinary tract infection, site not specified: Principal | ICD-10-CM | POA: Diagnosis present

## 2011-04-29 DIAGNOSIS — I428 Other cardiomyopathies: Secondary | ICD-10-CM | POA: Diagnosis present

## 2011-04-29 DIAGNOSIS — I658 Occlusion and stenosis of other precerebral arteries: Secondary | ICD-10-CM | POA: Diagnosis present

## 2011-04-29 DIAGNOSIS — I69920 Aphasia following unspecified cerebrovascular disease: Secondary | ICD-10-CM

## 2011-04-29 DIAGNOSIS — J690 Pneumonitis due to inhalation of food and vomit: Secondary | ICD-10-CM | POA: Diagnosis present

## 2011-04-29 DIAGNOSIS — G9340 Encephalopathy, unspecified: Secondary | ICD-10-CM | POA: Diagnosis present

## 2011-04-29 DIAGNOSIS — I6529 Occlusion and stenosis of unspecified carotid artery: Secondary | ICD-10-CM | POA: Diagnosis present

## 2011-04-29 DIAGNOSIS — I959 Hypotension, unspecified: Secondary | ICD-10-CM | POA: Diagnosis present

## 2011-04-29 DIAGNOSIS — R4701 Aphasia: Secondary | ICD-10-CM | POA: Diagnosis present

## 2011-04-29 DIAGNOSIS — R6510 Systemic inflammatory response syndrome (SIRS) of non-infectious origin without acute organ dysfunction: Secondary | ICD-10-CM | POA: Diagnosis present

## 2011-04-29 DIAGNOSIS — S93409A Sprain of unspecified ligament of unspecified ankle, initial encounter: Secondary | ICD-10-CM

## 2011-04-29 DIAGNOSIS — F17200 Nicotine dependence, unspecified, uncomplicated: Secondary | ICD-10-CM | POA: Diagnosis present

## 2011-04-29 DIAGNOSIS — I4891 Unspecified atrial fibrillation: Secondary | ICD-10-CM | POA: Diagnosis present

## 2011-04-29 DIAGNOSIS — I429 Cardiomyopathy, unspecified: Secondary | ICD-10-CM | POA: Diagnosis present

## 2011-04-29 DIAGNOSIS — Z880 Allergy status to penicillin: Secondary | ICD-10-CM

## 2011-04-29 DIAGNOSIS — S8010XA Contusion of unspecified lower leg, initial encounter: Secondary | ICD-10-CM

## 2011-04-29 DIAGNOSIS — R197 Diarrhea, unspecified: Secondary | ICD-10-CM

## 2011-04-29 DIAGNOSIS — Z720 Tobacco use: Secondary | ICD-10-CM | POA: Diagnosis present

## 2011-04-29 DIAGNOSIS — T45515A Adverse effect of anticoagulants, initial encounter: Secondary | ICD-10-CM | POA: Diagnosis present

## 2011-04-29 DIAGNOSIS — Z66 Do not resuscitate: Secondary | ICD-10-CM | POA: Diagnosis present

## 2011-04-29 DIAGNOSIS — I10 Essential (primary) hypertension: Secondary | ICD-10-CM | POA: Diagnosis present

## 2011-04-29 DIAGNOSIS — R791 Abnormal coagulation profile: Secondary | ICD-10-CM | POA: Diagnosis present

## 2011-04-29 DIAGNOSIS — E861 Hypovolemia: Secondary | ICD-10-CM | POA: Diagnosis present

## 2011-04-29 DIAGNOSIS — Z79899 Other long term (current) drug therapy: Secondary | ICD-10-CM

## 2011-04-29 DIAGNOSIS — Z91041 Radiographic dye allergy status: Secondary | ICD-10-CM

## 2011-04-29 DIAGNOSIS — I69959 Hemiplegia and hemiparesis following unspecified cerebrovascular disease affecting unspecified side: Secondary | ICD-10-CM

## 2011-04-29 DIAGNOSIS — K219 Gastro-esophageal reflux disease without esophagitis: Secondary | ICD-10-CM | POA: Diagnosis present

## 2011-04-29 DIAGNOSIS — I2789 Other specified pulmonary heart diseases: Secondary | ICD-10-CM | POA: Diagnosis present

## 2011-04-29 HISTORY — DX: Occlusion and stenosis of unspecified carotid artery: I65.29

## 2011-04-29 HISTORY — DX: Unspecified atrial fibrillation: I48.91

## 2011-04-29 HISTORY — DX: Gastro-esophageal reflux disease without esophagitis: K21.9

## 2011-04-29 HISTORY — DX: Tobacco use: Z72.0

## 2011-04-29 HISTORY — DX: Cardiomegaly: I51.7

## 2011-04-29 HISTORY — DX: Cerebral infarction, unspecified: I63.9

## 2011-04-29 HISTORY — DX: Malignant (primary) neoplasm, unspecified: C80.1

## 2011-04-29 LAB — COMPREHENSIVE METABOLIC PANEL
AST: 31 U/L (ref 0–37)
Albumin: 3.8 g/dL (ref 3.5–5.2)
BUN: 23 mg/dL (ref 6–23)
CO2: 23 mEq/L (ref 19–32)
Calcium: 9.7 mg/dL (ref 8.4–10.5)
Chloride: 103 mEq/L (ref 96–112)
Creatinine, Ser: 0.8 mg/dL (ref 0.50–1.10)
GFR calc non Af Amer: 71 mL/min — ABNORMAL LOW (ref >90)
Total Bilirubin: 0.7 mg/dL (ref 0.3–1.2)

## 2011-04-29 LAB — DIFFERENTIAL
Basophils Absolute: 0 10*3/uL (ref 0.0–0.1)
Basophils Relative: 0 % (ref 0–1)
Eosinophils Relative: 0 % (ref 0–5)
Monocytes Absolute: 0.4 10*3/uL (ref 0.1–1.0)
Monocytes Relative: 7 % (ref 3–12)

## 2011-04-29 LAB — CBC
HCT: 43.5 % (ref 36.0–46.0)
HCT: 38.1 % (ref 36.0–46.0)
Hemoglobin: 15.1 g/dL — ABNORMAL HIGH (ref 12.0–15.0)
MCH: 31.9 pg (ref 26.0–34.0)
MCHC: 34.7 g/dL (ref 30.0–36.0)
MCV: 91.8 fL (ref 78.0–100.0)
MCV: 92.7 fL (ref 78.0–100.0)
RDW: 14.2 % (ref 11.5–15.5)
RDW: 14.4 % (ref 11.5–15.5)
WBC: 5.2 10*3/uL (ref 4.0–10.5)

## 2011-04-29 LAB — LACTIC ACID, PLASMA: Lactic Acid, Venous: 1 mmol/L (ref 0.5–2.2)

## 2011-04-29 LAB — URINALYSIS, ROUTINE W REFLEX MICROSCOPIC
Leukocytes, UA: NEGATIVE
Protein, ur: NEGATIVE mg/dL
Specific Gravity, Urine: 1.02 (ref 1.005–1.030)
Urobilinogen, UA: 0.2 mg/dL (ref 0.0–1.0)

## 2011-04-29 LAB — CARDIAC PANEL(CRET KIN+CKTOT+MB+TROPI)
Relative Index: INVALID (ref 0.0–2.5)
Total CK: 83 U/L (ref 7–177)
Troponin I: <0.3 ng/mL (ref <0.30)

## 2011-04-29 LAB — LIPID PANEL
HDL: 29 mg/dL — ABNORMAL LOW (ref >39)
Total CHOL/HDL Ratio: 2.8 RATIO
VLDL: 13 mg/dL (ref 0–40)

## 2011-04-29 LAB — POCT I-STAT TROPONIN I

## 2011-04-29 LAB — URINE MICROSCOPIC-ADD ON

## 2011-04-29 MED ORDER — VANCOMYCIN HCL IN DEXTROSE 1-5 GM/200ML-% IV SOLN
1000.0000 mg | Freq: Once | INTRAVENOUS | Status: AC
  Administered 2011-04-29: 1000 mg via INTRAVENOUS
  Filled 2011-04-29: qty 200

## 2011-04-29 MED ORDER — IPRATROPIUM BROMIDE 0.02 % IN SOLN: 0.5000 mg | RESPIRATORY_TRACT | Status: DC | PRN

## 2011-04-29 MED ORDER — VANCOMYCIN HCL IN DEXTROSE 1-5 GM/200ML-% IV SOLN
1000.0000 mg | INTRAVENOUS | Status: DC
  Administered 2011-04-30 – 2011-05-01 (×2): 1000 mg via INTRAVENOUS
  Filled 2011-04-29 (×2): qty 200

## 2011-04-29 MED ORDER — IPRATROPIUM BROMIDE 0.02 % IN SOLN: 0.5000 mg | Freq: Four times a day (QID) | RESPIRATORY_TRACT | Status: DC

## 2011-04-29 MED ORDER — SODIUM CHLORIDE 0.9 % IV SOLN: INTRAVENOUS | Status: DC

## 2011-04-29 MED ORDER — ZOLPIDEM TARTRATE 5 MG PO TABS
5.0000 mg | ORAL_TABLET | Freq: Every day | ORAL | Status: DC
Start: 2011-04-29 — End: 2011-05-02
  Administered 2011-04-29 – 2011-05-01 (×3): 5 mg via ORAL
  Filled 2011-04-29 (×3): qty 1

## 2011-04-29 MED ORDER — ENOXAPARIN SODIUM 80 MG/0.8ML ~~LOC~~ SOLN: 40.0000 mg | SUBCUTANEOUS | Status: DC

## 2011-04-29 MED ORDER — TRIMETHOPRIM 100 MG PO TABS
100.0000 mg | ORAL_TABLET | Freq: Every day | ORAL | Status: DC
  Administered 2011-04-30 – 2011-05-02 (×3): 100 mg via ORAL
  Filled 2011-04-29 (×6): qty 1

## 2011-04-29 MED ORDER — SODIUM CHLORIDE 0.9 % IJ SOLN
3.0000 mL | Freq: Two times a day (BID) | INTRAMUSCULAR | Status: DC
  Administered 2011-04-29 – 2011-04-30 (×3): 3 mL via INTRAVENOUS
  Filled 2011-04-29 (×4): qty 3

## 2011-04-29 MED ORDER — ALBUTEROL SULFATE (5 MG/ML) 0.5% IN NEBU: 2.5000 mg | INHALATION_SOLUTION | Freq: Four times a day (QID) | RESPIRATORY_TRACT | Status: DC

## 2011-04-29 MED ORDER — SODIUM CHLORIDE 0.9 % IV BOLUS (SEPSIS)
2000.0000 mL | Freq: Once | INTRAVENOUS | Status: AC
  Administered 2011-04-29: 2000 mL via INTRAVENOUS

## 2011-04-29 MED ORDER — ALBUTEROL SULFATE (5 MG/ML) 0.5% IN NEBU: 2.5000 mg | INHALATION_SOLUTION | RESPIRATORY_TRACT | Status: DC | PRN

## 2011-04-29 MED ORDER — METOPROLOL TARTRATE 25 MG PO TABS
25.0000 mg | ORAL_TABLET | Freq: Two times a day (BID) | ORAL | Status: DC
  Administered 2011-04-29: 25 mg via ORAL
  Filled 2011-04-29: qty 1

## 2011-04-29 MED ORDER — SODIUM CHLORIDE 0.9 % IV SOLN
INTRAVENOUS | Status: AC
  Administered 2011-04-29: 17:00:00 via INTRAVENOUS

## 2011-04-29 MED ORDER — PRAZOSIN HCL 1 MG PO CAPS
2.0000 mg | ORAL_CAPSULE | Freq: Every day | ORAL | Status: DC
  Filled 2011-04-29 (×3): qty 1

## 2011-04-29 MED ORDER — BENAZEPRIL HCL 10 MG PO TABS
20.0000 mg | ORAL_TABLET | Freq: Two times a day (BID) | ORAL | Status: DC
  Filled 2011-04-29: qty 2
  Filled 2011-04-29 (×4): qty 1

## 2011-04-29 MED ORDER — SODIUM CHLORIDE 0.9 % IJ SOLN: 3.0000 mL | Freq: Two times a day (BID) | INTRAMUSCULAR | Status: DC

## 2011-04-29 MED ORDER — LEVOFLOXACIN IN D5W 750 MG/150ML IV SOLN
750.0000 mg | Freq: Once | INTRAVENOUS | Status: AC
  Administered 2011-04-29: 750 mg via INTRAVENOUS
  Filled 2011-04-29: qty 150

## 2011-04-29 MED ORDER — OXYBUTYNIN CHLORIDE ER 5 MG PO TB24
10.0000 mg | ORAL_TABLET | Freq: Every day | ORAL | Status: DC
  Administered 2011-04-30 – 2011-05-02 (×3): 10 mg via ORAL
  Filled 2011-04-29 (×2): qty 2
  Filled 2011-04-29 (×2): qty 1
  Filled 2011-04-29: qty 2

## 2011-04-29 MED ORDER — NICOTINE 21 MG/24HR TD PT24
21.0000 mg | MEDICATED_PATCH | Freq: Every day | TRANSDERMAL | Status: DC
  Administered 2011-04-29 – 2011-05-02 (×4): 21 mg via TRANSDERMAL
  Filled 2011-04-29 (×5): qty 1

## 2011-04-29 MED ORDER — WARFARIN - PHARMACIST DOSING INPATIENT: Freq: Every day | Status: DC

## 2011-04-29 MED ORDER — SODIUM CHLORIDE 0.9 % IV SOLN
INTRAVENOUS | Status: DC
  Administered 2011-04-29 (×3): via INTRAVENOUS

## 2011-04-29 MED ORDER — TEMAZEPAM 15 MG PO CAPS: 15.0000 mg | ORAL_CAPSULE | Freq: Every day | ORAL | Status: DC

## 2011-04-29 MED ORDER — ATORVASTATIN CALCIUM 10 MG PO TABS
10.0000 mg | ORAL_TABLET | Freq: Every day | ORAL | Status: DC
  Administered 2011-04-29 – 2011-05-01 (×3): 10 mg via ORAL
  Filled 2011-04-29 (×5): qty 1

## 2011-04-29 MED ORDER — AMLODIPINE BESYLATE 5 MG PO TABS
2.5000 mg | ORAL_TABLET | Freq: Every day | ORAL | Status: DC
  Administered 2011-04-30 – 2011-05-02 (×3): 2.5 mg via ORAL
  Filled 2011-04-29: qty 2
  Filled 2011-04-29 (×2): qty 1

## 2011-04-29 MED ORDER — ONDANSETRON HCL 4 MG/2ML IJ SOLN: 4.0000 mg | Freq: Four times a day (QID) | INTRAMUSCULAR | Status: DC | PRN

## 2011-04-29 MED ORDER — SODIUM CHLORIDE 0.9 % IV BOLUS (SEPSIS): 1000.0000 mL | Freq: Once | INTRAVENOUS | Status: DC

## 2011-04-29 MED ORDER — OXYCODONE HCL 5 MG PO TABS
5.0000 mg | ORAL_TABLET | ORAL | Status: DC | PRN
  Administered 2011-04-29: 5 mg via ORAL
  Filled 2011-04-29: qty 1

## 2011-04-29 MED ORDER — ONDANSETRON HCL 4 MG PO TABS: 4.0000 mg | ORAL_TABLET | Freq: Four times a day (QID) | ORAL | Status: DC | PRN

## 2011-04-29 NOTE — H&P (Signed)
PCP:  Cassell Smiles., MD, MD   DOA:  04/29/2011 12:03 PM  Chief Complaint:  Disoriented  HPI: 76 year old female with multiple co morbidities including but not limited to Atrial fibrillation on coumadin, CVA (in June 2012), HTN, Dyslipidemia who presented to ED with new onset slurred speech and disorientation. Most of the history was provided by patient's husband as this patient's speech is incoherent. As per patient's husband, patient got up this morning and was very confused. She had more pronounced slurred speech than what her baseline is. At the help of her husband she went back to her bed but then appeared to be more confused in terms of her orientation to other family members and place and could not articulate her needs. As per patient she did not have chest pain or fevers but did appear to have chills and was short of breath. There was no cough, no abdominal pain, no nausea or vomiting, no lightheadedness or dizziness, no loss of consciousness. There were no reports of diarrhea or constipation. No dysuria or hematuria. Patient will be admitted to hospitalist service for further evaluation and management.  Assessment/Plan  Principal Problem:   *APHASIA - concerning for new stroke as per patient's husband although the patient has had stroke in past with residual right upper extremity weaknes and aphasia, this aphasia is definitely worse than in past - we will obtain CT head again to evaluate the possibility of bleed (in setting of supratherapeutic INR) - MRI brain  Rule out CVA - to be done tomorrow - carotid doppler and 2 D ECHO - follow up cardiac enzymes (1st set cardiac panel negative) - follow up TSH, lipid panel, A1c - PT/OT evaluation - speech and swallow evaluation ( do not administer oral medications until SLP evaluation is complete)  Active Problems:   Supratherapeutic INR - hold coumadin - coumadin per pharmacy protocol - at present there are no signs of active bleed,  hemoglobin is stable at ~ 15   Atrial fibrillation - rate controlled a this time - hold po metoprolol until SLP completed  Possible aspiration pneumonia - in the setting pf pronounced aphasia - vancomycin and zosyn per pharmacy protocol - follow up results of blood cultures - nebulizer treatments as need for shortness of breath - O2 support via 2 L nasal canula   UTI (lower urinary tract infection) - no signs of urosepsis - urinalysis with results of yeast; we will start patient on fluconazole - procalcitonin within normal limits, lactic acid within normal limits - BP responded to fluid challenge - continue to monitor BP but at present patient is hemodynamically stable and does not requires any pressors  Disposition - to stepdown  Education - patient and family are aware of plan of care and treatment    Allergies: Allergies  Allergen Reactions  . Contrast Media (Iodinated Diagnostic Agents)     Per pt chart.  . Iodine   . Macrobid   . Nitrofurantoin   . Penicillins   . Sulfa Antibiotics   . Sulfonamide Derivatives     Prior to Admission medications   amLODipine (NORVASC) 2.5 MG tablet Take 2.5 mg by mouth daily.   Yes Historical Provider, MD  atorvastatin (LIPITOR) 10 MG tablet Take 10 mg by mouth at bedtime.   Yes Historical Provider, MD  benazepril (LOTENSIN) 20 MG tablet Take 20 mg by mouth 2 (two) times daily.   Yes Historical Provider, MD  estazolam (PROSOM) 2 MG tablet Take 2 mg by  mouth at bedtime.   Yes Historical Provider, MD  metoprolol tartrate (LOPRESSOR) 25 MG tablet Take 25 mg by mouth 2 (two) times daily.   Yes Historical Provider, MD  oxybutynin (DITROPAN-XL) 10 MG 24 hr tablet Take 10 mg by mouth daily.   Yes Historical Provider, MD  prazosin (MINIPRESS) 2 MG capsule Take 2 mg by mouth daily.   Yes Historical Provider, MD  trimethoprim (TRIMPEX) 100 MG tablet Take 100 mg by mouth daily.   Yes Historical Provider, MD    Past Medical History    Diagnosis Date  . Hypertension   . Stroke   . Acid reflux   . Atrial fibrillation   . Cancer     facial    Past Surgical History  Procedure Date  . Cholecystectomy   . Skin cancer excision   . Tonsillectomy     Social History:  reports that she has been smoking Cigarettes.  She does not have any smokeless tobacco history on file. She reports that she does not drink alcohol or use illicit drugs.  No family history on file.  Review of Systems:  Constitutional: Denies fever, (+) chills, (-) diaphoresis, appetite change and fatigue.  HEENT: Denies photophobia, eye pain, redness, hearing loss, ear pain, congestion, sore throat, rhinorrhea, sneezing, mouth sores, trouble swallowing, neck pain, neck stiffness and tinnitus.   Respiratory: (+) SOB, (-) DOE, cough, chest tightness,  and wheezing.   Cardiovascular: Denies chest pain, palpitations and leg swelling.  Gastrointestinal: Denies nausea, vomiting, abdominal pain, diarrhea, constipation, blood in stool and abdominal distention.  Genitourinary: Denies dysuria, urgency, frequency, hematuria, flank pain and difficulty urinating.  Musculoskeletal: Denies myalgias, back pain, joint swelling, arthralgias and gait problem.  Skin: Denies pallor, rash and wound.  Neurological: (-) dizziness, seizures, syncope, weakness, light-headedness, numbness and headaches; (+) slurred speech.  Hematological: Denies adenopathy. Easy bruising, personal or family bleeding history  Psychiatric/Behavioral: Denies suicidal ideation, mood changes, confusion, nervousness, sleep disturbance and agitation   Physical Exam:  Filed Vitals:   04/29/11 1553 04/29/11 1600 04/29/11 1616 04/29/11 1700  BP: 94/44 87/43  101/49  Pulse: 64 57  58  Temp:   100.5 F (38.1 C)   TempSrc:   Rectal   Resp: 18 22  17   Height:      Weight:      SpO2: 96% 96%  97%    Constitutional: Vital signs reviewed.  Patient is  in no acute distress and cooperative with exam.  Alert and oriented x3; slurred speech Head: Normocephalic and atraumatic Ear: TM normal bilaterally Mouth: no erythema or exudates, MMM Eyes: PERRL, EOMI, conjunctivae normal, No scleral icterus.  Neck: Supple, Trachea midline normal ROM, No JVD, mass, thyromegaly, or carotid bruit present.  Cardiovascular:irregular rhythm, rate controlled; S1 normal, S2 normal, no MRG, pulses symmetric and intact bilaterally Pulmonary/Chest: CTAB, no wheezes, rales, or rhonchi Abdominal: Soft. Non-tender, non-distended, bowel sounds are normal, no masses, organomegaly, or guarding present.  GU: no CVA tenderness Musculoskeletal: No joint deformities, erythema, or stiffness, ROM full and no nontender Ext: no edema and no cyanosis, pulses palpable bilaterally (DP and PT) Hematology: no cervical, inginal, or axillary adenopathy.  Neurological: A&O x3,   left lower extremity slightly decreased strength, right upper extremity weakness; cranial nerve II-XII are grossly intact, no focal motor deficit, sensory intact to light touch bilaterally.  Skin: Warm, dry and intact. No rash, cyanosis, or clubbing.  Psychiatric: Normal mood and affect. speech and behavior is normal. Judgment and thought content  normal. Cognition and memory are normal.   Labs on Admission:  Results for orders placed during the hospital encounter of 04/29/11 (from the past 48 hour(s))  PROTIME-INR     Status: Abnormal   Collection Time   04/29/11 12:38 PM      Component Value Range Comment   Prothrombin Time 39.7 (*) 11.6 - 15.2 (seconds)    INR 4.02 (*) 0.00 - 1.49    CBC     Status: Abnormal   Collection Time   04/29/11 12:38 PM      Component Value Range Comment   WBC 6.2  4.0 - 10.5 (K/uL)    RBC 4.74  3.87 - 5.11 (MIL/uL)    Hemoglobin 15.1 (*) 12.0 - 15.0 (g/dL)    HCT 16.1  09.6 - 04.5 (%)    MCV 91.8  78.0 - 100.0 (fL)    MCH 31.9  26.0 - 34.0 (pg)    MCHC 34.7  30.0 - 36.0 (g/dL)    RDW 40.9  81.1 - 91.4 (%)    Platelets 181   150 - 400 (K/uL)   DIFFERENTIAL     Status: Abnormal   Collection Time   04/29/11 12:38 PM      Component Value Range Comment   Neutrophils Relative 83 (*) 43 - 77 (%)    Neutro Abs 5.2  1.7 - 7.7 (K/uL)    Lymphocytes Relative 9 (*) 12 - 46 (%)    Lymphs Abs 0.6 (*) 0.7 - 4.0 (K/uL)    Monocytes Relative 7  3 - 12 (%)    Monocytes Absolute 0.4  0.1 - 1.0 (K/uL)    Eosinophils Relative 0  0 - 5 (%)    Eosinophils Absolute 0.0  0.0 - 0.7 (K/uL)    Basophils Relative 0  0 - 1 (%)    Basophils Absolute 0.0  0.0 - 0.1 (K/uL)   COMPREHENSIVE METABOLIC PANEL     Status: Abnormal   Collection Time   04/29/11 12:38 PM      Component Value Range Comment   Sodium 137  135 - 145 (mEq/L)    Potassium 3.8  3.5 - 5.1 (mEq/L)    Chloride 103  96 - 112 (mEq/L)    CO2 23  19 - 32 (mEq/L)    Glucose, Bld 106 (*) 70 - 99 (mg/dL)    BUN 23  6 - 23 (mg/dL)    Creatinine, Ser 7.82  0.50 - 1.10 (mg/dL)    Calcium 9.7  8.4 - 10.5 (mg/dL)    Total Protein 7.3  6.0 - 8.3 (g/dL)    Albumin 3.8  3.5 - 5.2 (g/dL)    AST 31  0 - 37 (U/L)    ALT 20  0 - 35 (U/L)    Alkaline Phosphatase 67  39 - 117 (U/L)    Total Bilirubin 0.7  0.3 - 1.2 (mg/dL)    GFR calc non Af Amer 71 (*) >90 (mL/min)    GFR calc Af Amer 83 (*) >90 (mL/min)   POCT I-STAT TROPONIN I     Status: Normal   Collection Time   04/29/11 12:40 PM      Component Value Range Comment   Troponin i, poc 0.01  0.00 - 0.08 (ng/mL)    Comment 3            CULTURE, BLOOD (ROUTINE X 2)     Status: Normal (Preliminary result)   Collection Time   04/29/11 12:45  PM      Component Value Range Comment   Specimen Description BLOOD RIGHT HAND      Special Requests        Value: BOTTLES DRAWN AEROBIC AND ANAEROBIC 6CC EACH BOTTLE   Culture PENDING      Report Status PENDING     PROCALCITONIN     Status: Normal   Collection Time   04/29/11 12:47 PM      Component Value Range Comment   Procalcitonin <0.10     CULTURE, BLOOD (ROUTINE X 2)     Status: Normal  (Preliminary result)   Collection Time   04/29/11  1:00 PM      Component Value Range Comment   Specimen Description BLOOD LEFT ARM      Special Requests        Value: BOTTLES DRAWN AEROBIC AND ANAEROBIC 6CC EACH BOTTLE   Culture PENDING      Report Status PENDING     LACTIC ACID, PLASMA     Status: Normal   Collection Time   04/29/11  1:15 PM      Component Value Range Comment   Lactic Acid, Venous 1.0  0.5 - 2.2 (mmol/L)   URINALYSIS, ROUTINE W REFLEX MICROSCOPIC     Status: Abnormal   Collection Time   04/29/11  3:06 PM      Component Value Range Comment   Color, Urine YELLOW  YELLOW     APPearance CLEAR  CLEAR     Specific Gravity, Urine 1.020  1.005 - 1.030     pH 6.0  5.0 - 8.0     Glucose, UA NEGATIVE  NEGATIVE (mg/dL)    Hgb urine dipstick TRACE (*) NEGATIVE     Bilirubin Urine NEGATIVE  NEGATIVE     Ketones, ur NEGATIVE  NEGATIVE (mg/dL)    Protein, ur NEGATIVE  NEGATIVE (mg/dL)    Urobilinogen, UA 0.2  0.0 - 1.0 (mg/dL)    Nitrite NEGATIVE  NEGATIVE     Leukocytes, UA NEGATIVE  NEGATIVE    URINE MICROSCOPIC-ADD ON     Status: Abnormal   Collection Time   04/29/11  3:06 PM      Component Value Range Comment   Squamous Epithelial / LPF RARE  RARE     WBC, UA 7-10  <3 (WBC/hpf)    RBC / HPF 11-20  <3 (RBC/hpf)    Bacteria, UA FEW (*) RARE     Urine-Other YEAST       Time Spent on Admission: Greater 30 minutes  Gaetan Spieker 04/29/2011, 5:24 PM

## 2011-04-29 NOTE — ED Provider Notes (Cosign Needed)
History   This chart was scribed for Andrea Horn, MD by [name]. The patient was seen in room IC05/IC05-01 and the patient's care was started at 12:28PM.    CSN: [id number]  Arrival date & time 04/29/11  1158   First MD Initiated Contact with Patient 04/29/11 1224      Chief Complaint  Patient presents with  . Aphasia    (Consider location/radiation/quality/duration/timing/severity/associated sxs/prior treatment) HPI Andrea [name] is a 76 y.o. female who presents to the Emergency Department complaining of general weakness with baseline persistent moderate aphasia and dysarthria with an onset of worse than usual weakness last night. Pt went to bed nml to baseline but around 3 AM, pt became generally weaker all over with chills, lightheadedness, transient disorientation, and decreased level of concentration which was back to baseline at time of exam; no trauma or fall. Pt also has had diarrhea for past 2 days, 1x a day, no hematochezia. Pt has a Hx of incontinence. No HA, cough, throat pain, CP, SOB, abd pain, or extremity pain, edema, or tingling. Pt is on coumadin 4-5 mg a day. Pt had a CVA June 12 of last year that affected her walking and speech, notably right-sided weakness. No other pertinent medical symptoms.   Past Medical History  Diagnosis Date  . Hypertension   . Stroke   . Acid reflux   . Atrial fibrillation   . Cancer     facial    Past Surgical History  Procedure Date  . Cholecystectomy   . Skin cancer excision   . Tonsillectomy     No family history on file.  History  Substance Use Topics  . Smoking status: Current Everyday Smoker    Types: Cigarettes  . Smokeless tobacco: Not on file  . Alcohol Use: No    OB History    Grav Para Term Preterm Abortions TAB SAB Ect Mult Living                  Review of Systems 10 Systems reviewed and are negative for acute change except as noted in the HPI.  Allergies  Contrast media; Iodine;  Macrobid; Nitrofurantoin; Penicillins; Sulfa antibiotics; and Sulfonamide derivatives  Home Medications  No current outpatient prescriptions on file.  BP 113/67  Pulse 79  Temp(Src) 98.1 F (36.7 C) (Oral)  Resp 22  Ht 5' (1.524 m)  Wt 121 lb 0.5 oz (54.9 kg)  BMI 23.64 kg/m2  SpO2 95%  Physical Exam  Nursing note and vitals reviewed. Constitutional: She is oriented to person, place, and time. She appears well-developed and well-nourished.       Awake, alert, nontoxic appearance with baseline speech for patient.  HENT:  Head: Normocephalic and atraumatic.  Mouth/Throat: No oropharyngeal exudate.  Eyes: Conjunctivae and EOM are normal. Pupils are equal, round, and reactive to light. Right eye exhibits no discharge. Left eye exhibits no discharge.  Neck: Normal range of motion. Neck supple.  Cardiovascular: Normal rate, regular rhythm and normal heart sounds.  Exam reveals no gallop and no friction rub.   No murmur heard. Pulmonary/Chest: Effort normal and breath sounds normal. No stridor. No respiratory distress. She has no wheezes. She has no rales. She exhibits no tenderness.  Abdominal: Soft. Bowel sounds are normal. She exhibits no mass. There is no tenderness. There is no rebound.  Musculoskeletal: She exhibits no tenderness.       Baseline ROM, moves extremities with no obvious new focal weakness.  Lymphadenopathy:  She has no cervical adenopathy.  Neurological: She is alert and oriented to person, place, and time.       Baseline right-sided weakness. No new focal weakness.  Skin: Skin is warm and dry. No rash noted.  Psychiatric: She has a normal mood and affect. Her behavior is normal.    ED Course  Procedures (including critical care time) ECG: Atrial fibrillation, premature ventricular complex, ventricular rate 85, left axis deviation, septal Q waves, nonspecific T wave changes, no significant change noted compared with June 2012 DIAGNOSTIC STUDIES: Oxygen  Saturation is 95% on room air, adequate by my interpretation.    COORDINATION OF CARE:  12:33PM - EDMD will order IV fluids, CXR, and urine workup for the pt.  1430- the patient remains awake and alert without lightheadedness despite hypotension in the emergency department. She is very clear that she does not want to be in the intensive care unit, she actually wants to go home. She does not want to vasopressors or intubation. Her husband clarifies she is DO NOT INTUBATE DO NOT RESUSCITATE and no aggressive measures such as central line or vasopressors.  D/w [facility] for admit when Pt allowed admit after urged by husband.  CRITICAL CARE Performed by: Andrea [name]   Total critical care time:  Critical care time was exclusive of separately billable procedures and treating other patients.  Critical care was necessary to treat or prevent imminent or life-threatening deterioration.  Critical care was time spent personally by me on the following activities: development of treatment plan with patient and/or surrogate as well as nursing, discussions with consultants, evaluation of patient's response to treatment, examination of patient, obtaining history from patient or surrogate, ordering and performing treatments and interventions, ordering and review of laboratory studies, ordering and review of radiographic studies, pulse oximetry and re-evaluation of patient's condition.  Results for orders placed during the hospital encounter of 04/29/11  LACTIC ACID, PLASMA      Component Value Range   Lactic Acid, Venous 1.0  0.5 - 2.2 (mmol/L)  PROCALCITONIN      Component Value Range   Procalcitonin <0.10    PROTIME-INR      Component Value Range   Prothrombin Time 39.7 (*) 11.6 - 15.2 (seconds)   INR 4.02 (*) 0.00 - 1.49   CBC      Component Value Range   WBC 6.2  4.0 - 10.5 (K/uL)   RBC 4.74  3.87 - 5.11 (MIL/uL)   Hemoglobin 15.1 (*) 12.0 - 15.0 (g/dL)   HCT 47.8  29.5 - 62.1 (%)   MCV  91.8  78.0 - 100.0 (fL)   MCH 31.9  26.0 - 34.0 (pg)   MCHC 34.7  30.0 - 36.0 (g/dL)   RDW 30.8  65.7 - 84.6 (%)   Platelets 181  150 - 400 (K/uL)  DIFFERENTIAL      Component Value Range   Neutrophils Relative 83 (*) 43 - 77 (%)   Neutro Abs 5.2  1.7 - 7.7 (K/uL)   Lymphocytes Relative 9 (*) 12 - 46 (%)   Lymphs Abs 0.6 (*) 0.7 - 4.0 (K/uL)   Monocytes Relative 7  3 - 12 (%)   Monocytes Absolute 0.4  0.1 - 1.0 (K/uL)   Eosinophils Relative 0  0 - 5 (%)   Eosinophils Absolute 0.0  0.0 - 0.7 (K/uL)   Basophils Relative 0  0 - 1 (%)   Basophils Absolute 0.0  0.0 - 0.1 (K/uL)  COMPREHENSIVE METABOLIC PANEL  Component Value Range   Sodium 137  135 - 145 (mEq/L)   Potassium 3.8  3.5 - 5.1 (mEq/L)   Chloride 103  96 - 112 (mEq/L)   CO2 23  19 - 32 (mEq/L)   Glucose, Bld 106 (*) 70 - 99 (mg/dL)   BUN 23  6 - 23 (mg/dL)   Creatinine, Ser 1.61  0.50 - 1.10 (mg/dL)   Calcium 9.7  8.4 - 09.6 (mg/dL)   Total Protein 7.3  6.0 - 8.3 (g/dL)   Albumin 3.8  3.5 - 5.2 (g/dL)   AST 31  0 - 37 (U/L)   ALT 20  0 - 35 (U/L)   Alkaline Phosphatase 67  39 - 117 (U/L)   Total Bilirubin 0.7  0.3 - 1.2 (mg/dL)   GFR calc non Af Amer 71 (*) >90 (mL/min)   GFR calc Af Amer 83 (*) >90 (mL/min)  URINALYSIS, ROUTINE W REFLEX MICROSCOPIC      Component Value Range   Color, Urine YELLOW  YELLOW    APPearance CLEAR  CLEAR    Specific Gravity, Urine 1.020  1.005 - 1.030    pH 6.0  5.0 - 8.0    Glucose, UA NEGATIVE  NEGATIVE (mg/dL)   Hgb urine dipstick TRACE (*) NEGATIVE    Bilirubin Urine NEGATIVE  NEGATIVE    Ketones, ur NEGATIVE  NEGATIVE (mg/dL)   Protein, ur NEGATIVE  NEGATIVE (mg/dL)   Urobilinogen, UA 0.2  0.0 - 1.0 (mg/dL)   Nitrite NEGATIVE  NEGATIVE    Leukocytes, UA NEGATIVE  NEGATIVE   CULTURE, BLOOD (ROUTINE X 2)      Component Value Range   Specimen Description BLOOD RIGHT HAND     Special Requests       Value: BOTTLES DRAWN AEROBIC AND ANAEROBIC 6CC EACH BOTTLE   Culture PENDING      Report Status PENDING    CULTURE, BLOOD (ROUTINE X 2)      Component Value Range   Specimen Description BLOOD LEFT ARM     Special Requests       Value: BOTTLES DRAWN AEROBIC AND ANAEROBIC 6CC EACH BOTTLE   Culture PENDING     Report Status PENDING    POCT I-STAT TROPONIN I      Component Value Range   Troponin i, poc 0.01  0.00 - 0.08 (ng/mL)   Comment 3           URINE MICROSCOPIC-ADD ON      Component Value Range   Squamous Epithelial / LPF RARE  RARE    WBC, UA 7-10  <3 (WBC/hpf)   RBC / HPF 11-20  <3 (RBC/hpf)   Bacteria, UA FEW (*) RARE    Urine-Other YEAST    CBC      Component Value Range   WBC 5.2  4.0 - 10.5 (K/uL)   RBC 4.11  3.87 - 5.11 (MIL/uL)   Hemoglobin 13.1  12.0 - 15.0 (g/dL)   HCT 04.5  40.9 - 81.1 (%)   MCV 92.7  78.0 - 100.0 (fL)   MCH 31.9  26.0 - 34.0 (pg)   MCHC 34.4  30.0 - 36.0 (g/dL)   RDW 91.4  78.2 - 95.6 (%)   Platelets 161  150 - 400 (K/uL)  MAGNESIUM      Component Value Range   Magnesium 1.3 (*) 1.5 - 2.5 (mg/dL)  PHOSPHORUS      Component Value Range   Phosphorus 3.1  2.3 - 4.6 (mg/dL)  CARDIAC PANEL(CRET KIN+CKTOT+MB+TROPI)      Component Value Range   Total CK 83  7 - 177 (U/L)   CK, MB 2.1  0.3 - 4.0 (ng/mL)   Troponin I <0.30  <0.30 (ng/mL)   Relative Index RELATIVE INDEX IS INVALID  0.0 - 2.5   PRO B NATRIURETIC PEPTIDE      Component Value Range   Pro B Natriuretic peptide (BNP) 2061.0 (*) 0 - 125 (pg/mL)  LIPID PANEL      Component Value Range   Cholesterol 82  0 - 200 (mg/dL)   Triglycerides 66  <191 (mg/dL)   HDL 29 (*) >47 (mg/dL)   Total CHOL/HDL Ratio 2.8     VLDL 13  0 - 40 (mg/dL)   LDL Cholesterol 40  0 - 99 (mg/dL)     Dg Chest 2 View  09/21/9560  *RADIOLOGY REPORT*  Clinical Data: Weakness and aphasia.  CHEST - 2 VIEW  Comparison: 07/29/2010.  Findings: The heart is enlarged but stable.  The mediastinal and hilar contours are unchanged.  Stable elevation of the left hemidiaphragm.  Mild chronic bronchitic  type lung changes but no definite acute infiltrate.  There is artifact across the right lower lung due to the patients arm.  IMPRESSION: Stable cardiac enlargement.  No definite acute pulmonary findings.  Original Report Authenticated By: P. Loralie Champagne, M.D.   Ct Head Wo Contrast  04/29/2011  *RADIOLOGY REPORT*  Clinical Data: 76 year old female with altered mental status.  CT HEAD WITHOUT CONTRAST  Technique:  Contiguous axial images were obtained from the base of the skull through the vertex without contrast.  Comparison: 07/29/2010  Findings: Mild chronic small vessel white matter ischemic changes are again identified.  There is an equivocal area of decreased attenuation involving the cortex within the posterior left frontal lobe and an acute-subacute infarct is difficult to exclude although this could be volume averaging through the fissure.  There is no evidence of mass lesion, mass effect, hydrocephalus, extra-axial fluid collection or hemorrhage.  The visualized bony calvarium is unremarkable.  IMPRESSION: Questionable decreased attenuation within the posterior left frontal lobe which could represent an acute to subacute infarct or volume averaging through the fissure.  Consider MRI for further evaluation as clinically indicated.  Mild chronic small vessel white matter ischemic changes.  Original Report Authenticated By: Rosendo Gros, M.D.     1. Hypotension   2. Urinary tract infection   3. Diarrhea       MDM  I personally performed the services described in this documentation, which was scribed in my presence. The recorded information has been reviewed and considered.         Andrea Horn, MD 04/29/11 [phone]

## 2011-04-29 NOTE — ED Notes (Signed)
Husband stated that pt began c/o not feeling well last night around 12mn, woke from sleep at 3am w/ cp, speech slurred more than usual, harder to move.  Had cva June 12, that affected her walking/speech.

## 2011-04-30 ENCOUNTER — Inpatient Hospital Stay (HOSPITAL_COMMUNITY): Payer: Medicare Other

## 2011-04-30 ENCOUNTER — Encounter (HOSPITAL_COMMUNITY): Payer: Self-pay | Admitting: Internal Medicine

## 2011-04-30 DIAGNOSIS — G9340 Encephalopathy, unspecified: Secondary | ICD-10-CM | POA: Diagnosis present

## 2011-04-30 DIAGNOSIS — Z720 Tobacco use: Secondary | ICD-10-CM | POA: Diagnosis present

## 2011-04-30 DIAGNOSIS — I10 Essential (primary) hypertension: Secondary | ICD-10-CM | POA: Diagnosis present

## 2011-04-30 HISTORY — DX: Tobacco use: Z72.0

## 2011-04-30 LAB — URINE MICROSCOPIC-ADD ON

## 2011-04-30 LAB — TSH: TSH: 0.328 u[IU]/mL — ABNORMAL LOW (ref 0.350–4.500)

## 2011-04-30 LAB — URINALYSIS, ROUTINE W REFLEX MICROSCOPIC
Glucose, UA: NEGATIVE mg/dL
Specific Gravity, Urine: 1.025 (ref 1.005–1.030)

## 2011-04-30 LAB — HEMOGLOBIN A1C
Hgb A1c MFr Bld: 5.6 % (ref <5.7)
Mean Plasma Glucose: 114 mg/dL (ref <117)

## 2011-04-30 LAB — CBC
HCT: 38.4 % (ref 36.0–46.0)
Hemoglobin: 13.2 g/dL (ref 12.0–15.0)
MCH: 31.7 pg (ref 26.0–34.0)
MCV: 92.1 fL (ref 78.0–100.0)
RBC: 4.17 MIL/uL (ref 3.87–5.11)

## 2011-04-30 LAB — URINE CULTURE
Colony Count: NO GROWTH
Culture  Setup Time: 201303092057
Culture: NO GROWTH

## 2011-04-30 LAB — PROTIME-INR
INR: 3.98 — ABNORMAL HIGH (ref 0.00–1.49)
Prothrombin Time: 39.4 seconds — ABNORMAL HIGH (ref 11.6–15.2)

## 2011-04-30 LAB — CARDIAC PANEL(CRET KIN+CKTOT+MB+TROPI)
CK, MB: 2.2 ng/mL (ref 0.3–4.0)
Relative Index: INVALID (ref 0.0–2.5)
Total CK: 98 U/L (ref 7–177)

## 2011-04-30 LAB — GLUCOSE, CAPILLARY: Glucose-Capillary: 79 mg/dL (ref 70–99)

## 2011-04-30 LAB — BASIC METABOLIC PANEL
CO2: 21 mEq/L (ref 19–32)
Chloride: 104 mEq/L (ref 96–112)
Glucose, Bld: 90 mg/dL (ref 70–99)
Potassium: 4 mEq/L (ref 3.5–5.1)
Sodium: 134 mEq/L — ABNORMAL LOW (ref 135–145)

## 2011-04-30 MED ORDER — BENAZEPRIL HCL 10 MG PO TABS
10.0000 mg | ORAL_TABLET | Freq: Two times a day (BID) | ORAL | Status: DC
  Administered 2011-04-30 – 2011-05-01 (×3): 10 mg via ORAL
  Filled 2011-04-30 (×4): qty 1

## 2011-04-30 MED ORDER — PRAZOSIN HCL 1 MG PO CAPS
1.0000 mg | ORAL_CAPSULE | Freq: Every day | ORAL | Status: DC
  Administered 2011-04-30 – 2011-05-02 (×3): 1 mg via ORAL
  Filled 2011-04-30 (×4): qty 1

## 2011-04-30 MED ORDER — FAMOTIDINE 20 MG PO TABS
20.0000 mg | ORAL_TABLET | Freq: Every day | ORAL | Status: DC
  Administered 2011-04-30 – 2011-05-02 (×3): 20 mg via ORAL
  Filled 2011-04-30 (×3): qty 1

## 2011-04-30 MED ORDER — ALUM & MAG HYDROXIDE-SIMETH 200-200-20 MG/5ML PO SUSP: 15.0000 mL | ORAL | Status: DC | PRN

## 2011-04-30 MED ORDER — METOPROLOL TARTRATE 25 MG PO TABS
12.5000 mg | ORAL_TABLET | Freq: Two times a day (BID) | ORAL | Status: DC
  Administered 2011-04-30 – 2011-05-01 (×3): 12.5 mg via ORAL
  Filled 2011-04-30 (×3): qty 1

## 2011-04-30 MED ORDER — LEVOFLOXACIN IN D5W 500 MG/100ML IV SOLN
500.0000 mg | INTRAVENOUS | Status: DC
  Administered 2011-04-30 – 2011-05-02 (×3): 500 mg via INTRAVENOUS
  Filled 2011-04-30 (×4): qty 100

## 2011-04-30 NOTE — Progress Notes (Signed)
Pt to be transferred to room 317 per MD order. Report called to RN. Pt to be transferred via bed. Personal belongings sent with patient.

## 2011-04-30 NOTE — Consult Note (Signed)
ANTICOAGULATION CONSULT NOTE - Initial Consult  Pharmacy Consult for Warfarin Indication: atrial fibrillation  Allergies  Allergen Reactions  . Contrast Media (Iodinated Diagnostic Agents)     Per pt chart.  . Iodine   . Macrobid   . Nitrofurantoin   . Penicillins   . Sulfa Antibiotics   . Sulfonamide Derivatives     Patient Measurements: Height: 5' (152.4 cm) Weight: 125 lb 7.1 oz (56.9 kg) IBW/kg (Calculated) : 45.5   Vital Signs: Temp: 99.5 F (37.5 C) (03/10 0400) Temp src: Oral (03/10 0400) BP: 122/57 mmHg (03/10 1032) Pulse Rate: 76  (03/10 1032)  Labs:  Basename 04/30/11 0832 04/30/11 0124 04/29/11 1738 04/29/11 1238  HGB -- 13.2 13.1 --  HCT -- 38.4 38.1 43.5  PLT -- 152 161 181  APTT -- -- -- --  LABPROT 39.4* -- -- 39.7*  INR 3.98* -- -- 4.02*  HEPARINUNFRC -- -- -- --  CREATININE -- 0.58 -- 0.80  CKTOTAL 95 98 83 --  CKMB 2.2 2.1 2.1 --  TROPONINI <0.30 <0.30 <0.30 --   Estimated Creatinine Clearance: 49.5 ml/min (by C-G formula based on Cr of 0.58).  Medical History: Past Medical History  Diagnosis Date  . Hypertension   . Stroke   . Acid reflux   . Atrial fibrillation   . Cancer     facial  . Tobacco abuse 04/30/2011    Medications:  Prescriptions prior to admission  Medication Sig Dispense Refill  . amLODipine (NORVASC) 2.5 MG tablet Take 2.5 mg by mouth daily.      Marland Kitchen atorvastatin (LIPITOR) 10 MG tablet Take 10 mg by mouth at bedtime.      . benazepril (LOTENSIN) 20 MG tablet Take 20 mg by mouth 2 (two) times daily.      Marland Kitchen estazolam (PROSOM) 2 MG tablet Take 2 mg by mouth at bedtime.      . metoprolol tartrate (LOPRESSOR) 25 MG tablet Take 25 mg by mouth 2 (two) times daily.      Marland Kitchen oxybutynin (DITROPAN-XL) 10 MG 24 hr tablet Take 10 mg by mouth daily.      . prazosin (MINIPRESS) 2 MG capsule Take 2 mg by mouth daily.      Marland Kitchen trimethoprim (TRIMPEX) 100 MG tablet Take 100 mg by mouth daily.        Assessment: INR is  supra-therapeutic  Goal of Therapy:  INR 2-3   Plan: No warfarin today INR daily until stable  Azaleah Usman A 04/30/2011,11:23 AM

## 2011-04-30 NOTE — Consult Note (Signed)
ANTIBIOTIC CONSULT NOTE - INITIAL  Pharmacy Consult for Vancomycin Indication: rule out pneumonia  Allergies  Allergen Reactions  . Contrast Media (Iodinated Diagnostic Agents)     Per pt chart.  . Iodine   . Macrobid   . Nitrofurantoin   . Penicillins   . Sulfa Antibiotics   . Sulfonamide Derivatives    Patient Measurements: Height: 5' (152.4 cm) Weight: 125 lb 7.1 oz (56.9 kg) IBW/kg (Calculated) : 45.5   Vital Signs: Temp: 99.5 F (37.5 C) (03/10 0400) Temp src: Oral (03/10 0400) BP: 122/57 mmHg (03/10 1032) Pulse Rate: 76  (03/10 1032) Intake/Output from previous day: 03/09 0701 - 03/10 0700 In: -  Out: 600 [Urine:600] Intake/Output from this shift:    Labs:  Basename 04/30/11 0124 04/29/11 1738 04/29/11 1238  WBC 4.5 5.2 6.2  HGB 13.2 13.1 15.1*  PLT 152 161 181  LABCREA -- -- --  CREATININE 0.58 -- 0.80   Estimated Creatinine Clearance: 49.5 ml/min (by C-G formula based on Cr of 0.58). No results found for this basename: VANCOTROUGH:2,VANCOPEAK:2,VANCORANDOM:2,GENTTROUGH:2,GENTPEAK:2,GENTRANDOM:2,TOBRATROUGH:2,TOBRAPEAK:2,TOBRARND:2,AMIKACINPEAK:2,AMIKACINTROU:2,AMIKACIN:2, in the last 72 hours   Microbiology: Recent Results (from the past 720 hour(s))  CULTURE, BLOOD (ROUTINE X 2)     Status: Normal (Preliminary result)   Collection Time   04/29/11 12:45 PM      Component Value Range Status Comment   Specimen Description BLOOD RIGHT HAND   Final    Special Requests     Final    Value: BOTTLES DRAWN AEROBIC AND ANAEROBIC 6CC EACH BOTTLE   Culture NO GROWTH 1 DAY   Final    Report Status PENDING   Incomplete   CULTURE, BLOOD (ROUTINE X 2)     Status: Normal (Preliminary result)   Collection Time   04/29/11  1:00 PM      Component Value Range Status Comment   Specimen Description BLOOD LEFT ARM   Final    Special Requests     Final    Value: BOTTLES DRAWN AEROBIC AND ANAEROBIC 6CC EACH BOTTLE   Culture NO GROWTH 1 DAY   Final    Report Status PENDING    Incomplete   MRSA PCR SCREENING     Status: Normal   Collection Time   04/29/11  8:03 PM      Component Value Range Status Comment   MRSA by PCR NEGATIVE  NEGATIVE  Final    Medical History: Past Medical History  Diagnosis Date  . Hypertension   . Stroke   . Acid reflux   . Atrial fibrillation   . Cancer     facial  . Tobacco abuse 04/30/2011   Medications:  Scheduled:    . sodium chloride   Intravenous STAT  . amLODipine  2.5 mg Oral Daily  . atorvastatin  10 mg Oral QHS  . benazepril  10 mg Oral BID  . famotidine  20 mg Oral Daily  . levofloxacin (LEVAQUIN) IV  500 mg Intravenous Q24H  . levofloxacin (LEVAQUIN) IV  750 mg Intravenous Once  . metoprolol tartrate  12.5 mg Oral BID  . nicotine  21 mg Transdermal Daily  . oxybutynin  10 mg Oral Daily  . prazosin  1 mg Oral Daily  . sodium chloride  2,000 mL Intravenous Once  . sodium chloride  3 mL Intravenous Q12H  . trimethoprim  100 mg Oral Daily  . vancomycin  1,000 mg Intravenous Once  . vancomycin  1,000 mg Intravenous Q24H  . Warfarin - Pharmacist Dosing  Inpatient   Does not apply q1800  . zolpidem  5 mg Oral QHS  . DISCONTD: albuterol  2.5 mg Nebulization Q6H  . DISCONTD: benazepril  20 mg Oral BID  . DISCONTD: enoxaparin  40 mg Subcutaneous Q24H  . DISCONTD: ipratropium  0.5 mg Nebulization Q6H  . DISCONTD: metoprolol tartrate  25 mg Oral BID  . DISCONTD: prazosin  2 mg Oral Daily  . DISCONTD: sodium chloride  1,000 mL Intravenous Once  . DISCONTD: sodium chloride  3 mL Intravenous Q12H  . DISCONTD: temazepam  15 mg Oral QHS   Assessment: On Levaquin and Vancomycin empirically for pna clcr < 50  Goal of Therapy:  Vancomycin trough level 15-20 mcg/ml  Plan: Vancomycin 1gm iv q24hrs Check trough at steady state Labs per protocol  Valrie Hart A 04/30/2011,11:29 AM

## 2011-04-30 NOTE — Progress Notes (Signed)
Subjective:  The patient has no complaints of chest pain or shortness of breath. She has no complaints of headache or dizziness. She does complain of indigestion.  Objective: Vital signs in last 24 hours: Filed Vitals:   04/30/11 0300 04/30/11 0400 04/30/11 0500 04/30/11 0600  BP:  123/59 132/58 140/76  Pulse:   71 79  Temp:  99.5 F (37.5 C)    TempSrc:  Oral    Resp: 22 22 24 21   Height:      Weight:  56.9 kg (125 lb 7.1 oz)    SpO2:   96% 94%    Intake/Output Summary (Last 24 hours) at 04/30/11 3474 Last data filed at 04/30/11 0500  Gross per 24 hour  Intake      0 ml  Output    600 ml  Net   -600 ml    Weight change:   Physical exam: General: Pleasant alert 76 year old Caucasian woman lying in bed, in no acute distress. Lungs: Clear anteriorly with decreased breath sounds in the bases. Heart: Irregular, irregular. Abdomen: Positive bowel sounds, soft, nontender, nondistended. Extremities: No pedal edema. Neurologic: She is alert and oriented x2. She has expressive aphasia. She has mild dysarthria. There is 5 minus to 4+ strength of the right upper and right lower extremity and 5 over 5 strength of the left upper and left lower extremities. She follows directions well.   Lab Results: Basic Metabolic Panel:  Basename 04/30/11 0124 04/29/11 1738 04/29/11 1238  NA 134* -- 137  K 4.0 -- 3.8  CL 104 -- 103  CO2 21 -- 23  GLUCOSE 90 -- 106*  BUN 15 -- 23  CREATININE 0.58 -- 0.80  CALCIUM 8.1* -- 9.7  MG -- 1.3* --  PHOS -- 3.1 --   Liver Function Tests:  Basename 04/29/11 1238  AST 31  ALT 20  ALKPHOS 67  BILITOT 0.7  PROT 7.3  ALBUMIN 3.8   No results found for this basename: LIPASE:2,AMYLASE:2 in the last 72 hours No results found for this basename: AMMONIA:2 in the last 72 hours CBC:  Basename 04/30/11 0124 04/29/11 1738 04/29/11 1238  WBC 4.5 5.2 --  NEUTROABS -- -- 5.2  HGB 13.2 13.1 --  HCT 38.4 38.1 --  MCV 92.1 92.7 --  PLT 152 161 --    Cardiac Enzymes:  Basename 04/30/11 0124 04/29/11 1738  CKTOTAL 98 83  CKMB 2.1 2.1  CKMBINDEX -- --  TROPONINI <0.30 <0.30   BNP:  Basename 04/29/11 1745  PROBNP 2061.0*   D-Dimer: No results found for this basename: DDIMER:2 in the last 72 hours CBG: No results found for this basename: GLUCAP:6 in the last 72 hours Hemoglobin A1C: No results found for this basename: HGBA1C in the last 72 hours Fasting Lipid Panel:  Basename 04/29/11 1238  CHOL 82  HDL 29*  LDLCALC 40  TRIG 66  CHOLHDL 2.8  LDLDIRECT --   Thyroid Function Tests: No results found for this basename: TSH,T4TOTAL,FREET4,T3FREE,THYROIDAB in the last 72 hours Anemia Panel: No results found for this basename: VITAMINB12,FOLATE,FERRITIN,TIBC,IRON,RETICCTPCT in the last 72 hours Coagulation:  Basename 04/29/11 1238  LABPROT 39.7*  INR 4.02*   Urine Drug Screen: Drugs of Abuse  No results found for this basename: labopia,  cocainscrnur,  labbenz,  amphetmu,  thcu,  labbarb    Alcohol Level: No results found for this basename: ETH:2 in the last 72 hours Urinalysis:  Basename 04/29/11 1506  COLORURINE YELLOW  LABSPEC 1.020  PHURINE 6.0  GLUCOSEU NEGATIVE  HGBUR TRACE*  BILIRUBINUR NEGATIVE  KETONESUR NEGATIVE  PROTEINUR NEGATIVE  UROBILINOGEN 0.2  NITRITE NEGATIVE  LEUKOCYTESUR NEGATIVE   Misc. Labs:  Micro: Recent Results (from the past 240 hour(s))  CULTURE, BLOOD (ROUTINE X 2)     Status: Normal (Preliminary result)   Collection Time   04/29/11 12:45 PM      Component Value Range Status Comment   Specimen Description BLOOD RIGHT HAND   Final    Special Requests     Final    Value: BOTTLES DRAWN AEROBIC AND ANAEROBIC 6CC EACH BOTTLE   Culture NO GROWTH 1 DAY   Final    Report Status PENDING   Incomplete   CULTURE, BLOOD (ROUTINE X 2)     Status: Normal (Preliminary result)   Collection Time   04/29/11  1:00 PM      Component Value Range Status Comment   Specimen Description BLOOD  LEFT ARM   Final    Special Requests     Final    Value: BOTTLES DRAWN AEROBIC AND ANAEROBIC 6CC EACH BOTTLE   Culture NO GROWTH 1 DAY   Final    Report Status PENDING   Incomplete   MRSA PCR SCREENING     Status: Normal   Collection Time   04/29/11  8:03 PM      Component Value Range Status Comment   MRSA by PCR NEGATIVE  NEGATIVE  Final     Studies/Results: Dg Chest 2 View  04/29/2011  *RADIOLOGY REPORT*  Clinical Data: Weakness and aphasia.  CHEST - 2 VIEW  Comparison: 07/29/2010.  Findings: The heart is enlarged but stable.  The mediastinal and hilar contours are unchanged.  Stable elevation of the left hemidiaphragm.  Mild chronic bronchitic type lung changes but no definite acute infiltrate.  There is artifact across the right lower lung due to the patients arm.  IMPRESSION: Stable cardiac enlargement.  No definite acute pulmonary findings.  Original Report Authenticated By: P. Loralie Champagne, M.D.   Ct Head Wo Contrast  04/29/2011  *RADIOLOGY REPORT*  Clinical Data: 76 year old female with altered mental status.  CT HEAD WITHOUT CONTRAST  Technique:  Contiguous axial images were obtained from the base of the skull through the vertex without contrast.  Comparison: 07/29/2010  Findings: Mild chronic small vessel white matter ischemic changes are again identified.  There is an equivocal area of decreased attenuation involving the cortex within the posterior left frontal lobe and an acute-subacute infarct is difficult to exclude although this could be volume averaging through the fissure.  There is no evidence of mass lesion, mass effect, hydrocephalus, extra-axial fluid collection or hemorrhage.  The visualized bony calvarium is unremarkable.  IMPRESSION: Questionable decreased attenuation within the posterior left frontal lobe which could represent an acute to subacute infarct or volume averaging through the fissure.  Consider MRI for further evaluation as clinically indicated.  Mild chronic small  vessel white matter ischemic changes.  Original Report Authenticated By: Rosendo Gros, M.D.    Medications: I have reviewed the patient's current medications.  Assessment: Principal Problem:  *Aphasia Active Problems:  Supratherapeutic INR  Atrial fibrillation  UTI (lower urinary tract infection)  Hypotension  Encephalopathy  Tobacco abuse   1. Encephalopathy. Likely secondary to hypotension and urinary tract infection. Also consider an acute or subacute stroke as a possibility given the results of the CT scan of the head. No cerebral bleeding noted. Her encephalopathy has resolved.  Reported worsening aphasia. Not sure  what her baseline is. She does have some difficulty speaking, however her speech is understandable.  History of previous stroke with right-sided hemiparesis and expressive aphasia.  Urinary tract infection. There was also concern about a possible superimposed aspiration pneumonia. She is currently being treated broad spectrum with Levaquin and vancomycin for now. She did have a low-grade fever.  Hypotension in the setting of history of hypertension. Now resolved after approximately 3 L of IV fluids. She does take 4 antihypertensive medications chronically. This may be a factor in her presenting low blood pressure. Sepsis or Sirs was a consideration however her pro calcitonin and lactic acid levels were within normal limits.  Coagulopathy/supratherapeutic INR. Coumadin is currently on hold. PT/INR is pending. No evidence of bleeding.  Chronic atrial fibrillation. Her rate is currently controlled. She is anticoagulated as above.    Plan:  1. We'll order MRI of the brain for further evaluation.2-D echocardiogram and carotid ultrasound have been ordered. 2. TSH has been ordered and we'll check the results. 3. A urine culture and blood cultures have been ordered. Results are pending. 4. The registered nurse performed a bedside swallow evaluation. The patient passed.  We'll therefore advance her diet to a full liquid diet. 5. Will modify her antihypertensive medications as to avoid symptomatic hypotension in the setting of a possible acute stroke or TIA. 6. Coumadin per pharmacy. 7. OT/PT/ST consults have been ordered and are pending. 8. We'll transfer the patient to telemetry. 9. Tobacco cessation counseling.   LOS: 1 day   Cherise Fedder 04/30/2011, 9:03 AM

## 2011-04-30 NOTE — Progress Notes (Deleted)
Subjective:  The patient has no complaints of chest pain or shortness of breath. She has no complaints of headache or dizziness. She does complain of indigestion.  Objective: Vital signs in last 24 hours: Filed Vitals:   04/30/11 0300 04/30/11 0400 04/30/11 0500 04/30/11 0600  BP:  123/59 132/58 140/76  Pulse:   71 79  Temp:  99.5 F (37.5 C)    TempSrc:  Oral    Resp: 22 22 24 21   Height:      Weight:  56.9 kg (125 lb 7.1 oz)    SpO2:   96% 94%    Intake/Output Summary (Last 24 hours) at 04/30/11 0842 Last data filed at 04/30/11 0500  Gross per 24 hour  Intake      0 ml  Output    600 ml  Net   -600 ml    Weight change:   Physical exam: General: Pleasant alert 76 year old Caucasian woman lying in bed, in no acute distress. Lungs: Clear anteriorly with decreased breath sounds in the bases. Heart: Irregular, irregular. Abdomen: Positive bowel sounds, soft, nontender, nondistended. Extremities: No pedal edema. Neurologic: She is alert and oriented x2. She has expressive aphasia. She has mild dysarthria. There is 5 minus to 4+ strength of the right upper and right lower extremity and 5 over 5 strength of the left upper and left lower extremities. She follows directions well.   Lab Results: Basic Metabolic Panel:  Basename 04/30/11 0124 04/29/11 1738 04/29/11 1238  NA 134* -- 137  K 4.0 -- 3.8  CL 104 -- 103  CO2 21 -- 23  GLUCOSE 90 -- 106*  BUN 15 -- 23  CREATININE 0.58 -- 0.80  CALCIUM 8.1* -- 9.7  MG -- 1.3* --  PHOS -- 3.1 --   Liver Function Tests:  Basename 04/29/11 1238  AST 31  ALT 20  ALKPHOS 67  BILITOT 0.7  PROT 7.3  ALBUMIN 3.8   No results found for this basename: LIPASE:2,AMYLASE:2 in the last 72 hours No results found for this basename: AMMONIA:2 in the last 72 hours CBC:  Basename 04/30/11 0124 04/29/11 1738 04/29/11 1238  WBC 4.5 5.2 --  NEUTROABS -- -- 5.2  HGB 13.2 13.1 --  HCT 38.4 38.1 --  MCV 92.1 92.7 --  PLT 152 161 --    Cardiac Enzymes:  Basename 04/30/11 0124 04/29/11 1738  CKTOTAL 98 83  CKMB 2.1 2.1  CKMBINDEX -- --  TROPONINI <0.30 <0.30   BNP:  Basename 04/29/11 1745  PROBNP 2061.0*   D-Dimer: No results found for this basename: DDIMER:2 in the last 72 hours CBG: No results found for this basename: GLUCAP:6 in the last 72 hours Hemoglobin A1C: No results found for this basename: HGBA1C in the last 72 hours Fasting Lipid Panel:  Basename 04/29/11 1238  CHOL 82  HDL 29*  LDLCALC 40  TRIG 66  CHOLHDL 2.8  LDLDIRECT --   Thyroid Function Tests: No results found for this basename: TSH,T4TOTAL,FREET4,T3FREE,THYROIDAB in the last 72 hours Anemia Panel: No results found for this basename: VITAMINB12,FOLATE,FERRITIN,TIBC,IRON,RETICCTPCT in the last 72 hours Coagulation:  Basename 04/29/11 1238  LABPROT 39.7*  INR 4.02*   Urine Drug Screen: Drugs of Abuse  No results found for this basename: labopia, cocainscrnur, labbenz, amphetmu, thcu, labbarb    Alcohol Level: No results found for this basename: ETH:2 in the last 72 hours Urinalysis:  Basename 04/29/11 1506  COLORURINE YELLOW  LABSPEC 1.020  PHURINE 6.0  GLUCOSEU NEGATIVE  HGBUR  TRACE*  BILIRUBINUR NEGATIVE  KETONESUR NEGATIVE  PROTEINUR NEGATIVE  UROBILINOGEN 0.2  NITRITE NEGATIVE  LEUKOCYTESUR NEGATIVE   Misc. Labs:  Micro: Recent Results (from the past 240 hour(s))  CULTURE, BLOOD (ROUTINE X 2)     Status: Normal (Preliminary result)   Collection Time   04/29/11 12:45 PM      Component Value Range Status Comment   Specimen Description BLOOD RIGHT HAND   Final    Special Requests     Final    Value: BOTTLES DRAWN AEROBIC AND ANAEROBIC 6CC EACH BOTTLE   Culture NO GROWTH 1 DAY   Final    Report Status PENDING   Incomplete   CULTURE, BLOOD (ROUTINE X 2)     Status: Normal (Preliminary result)   Collection Time   04/29/11  1:00 PM      Component Value Range Status Comment   Specimen Description BLOOD LEFT  ARM   Final    Special Requests     Final    Value: BOTTLES DRAWN AEROBIC AND ANAEROBIC 6CC EACH BOTTLE   Culture NO GROWTH 1 DAY   Final    Report Status PENDING   Incomplete   MRSA PCR SCREENING     Status: Normal   Collection Time   04/29/11  8:03 PM      Component Value Range Status Comment   MRSA by PCR NEGATIVE  NEGATIVE  Final     Studies/Results: Dg Chest 2 View  04/29/2011  *RADIOLOGY REPORT*  Clinical Data: Weakness and aphasia.  CHEST - 2 VIEW  Comparison: 07/29/2010.  Findings: The heart is enlarged but stable.  The mediastinal and hilar contours are unchanged.  Stable elevation of the left hemidiaphragm.  Mild chronic bronchitic type lung changes but no definite acute infiltrate.  There is artifact across the right lower lung due to the patients arm.  IMPRESSION: Stable cardiac enlargement.  No definite acute pulmonary findings.  Original Report Authenticated By: P. Loralie Champagne, M.D.   Ct Head Wo Contrast  04/29/2011  *RADIOLOGY REPORT*  Clinical Data: 76 year old female with altered mental status.  CT HEAD WITHOUT CONTRAST  Technique:  Contiguous axial images were obtained from the base of the skull through the vertex without contrast.  Comparison: 07/29/2010  Findings: Mild chronic small vessel white matter ischemic changes are again identified.  There is an equivocal area of decreased attenuation involving the cortex within the posterior left frontal lobe and an acute-subacute infarct is difficult to exclude although this could be volume averaging through the fissure.  There is no evidence of mass lesion, mass effect, hydrocephalus, extra-axial fluid collection or hemorrhage.  The visualized bony calvarium is unremarkable.  IMPRESSION: Questionable decreased attenuation within the posterior left frontal lobe which could represent an acute to subacute infarct or volume averaging through the fissure.  Consider MRI for further evaluation as clinically indicated.  Mild chronic small  vessel white matter ischemic changes.  Original Report Authenticated By: Rosendo Gros, M.D.    Medications: I have reviewed the patient's current medications.  Assessment: Principal Problem:  *Aphasia Active Problems:  Supratherapeutic INR  Atrial fibrillation  UTI (lower urinary tract infection)  Hypotension  Encephalopathy   1. Encephalopathy. Likely secondary to hypotension and urinary tract infection. Also consider an acute or subacute stroke as a possibility given the results of the CT scan of the head. No cerebral bleeding noted. Her encephalopathy has resolved.  Reported worsening aphasia. Not sure what her baseline is. She does have  some difficulty speaking, however her speech is understandable.  History of previous stroke with right-sided hemiparesis and expressive aphasia.  Urinary tract infection. There was also concern about a possible superimposed aspiration pneumonia. She is currently being treated broad spectrum with Zosyn and vancomycin for now. She did have a low-grade fever.  Hypotensioni n the setting of history of hypertension. Now resolved after approximately 3 L of IV fluids. She does take 4 antihypertensive medications chronically. This may be a factor in her presenting low blood pressure. Sepsis or Sirs was a consideration however her pro calcitonin and lactic acid levels were within normal limits.  Coagulopathy/supratherapeutic INR. Coumadin is currently on hold. PT/INR is pending. No evidence of bleeding.  Chronic atrial fibrillation. Her rate is currently controlled. She is anticoagulated as above.    Plan:  1. We'll order MRI of the brain for further evaluation.2-D echocardiogram and carotid ultrasound have been ordered. 2. TSH has been ordered and we'll check the results. 3. A urine culture and blood cultures have been ordered. Results are pending. 4. The registered nurse performed a bedside swallow evaluation. The patient passed. We'll therefore  advance her diet to a full liquid diet. 5. Will modify her antihypertensive medications as to avoid symptomatic hypotension in the setting of a possible acute stroke or TIA. 6. Coumadin per pharmacy. 7. OT/PT/ST consults have been ordered and are pending. 8. We'll transfer the patient to telemetry.   LOS: 1 day   Quanna Wittke 04/30/2011, 8:42 AM

## 2011-05-01 ENCOUNTER — Inpatient Hospital Stay (HOSPITAL_COMMUNITY): Payer: Medicare Other

## 2011-05-01 ENCOUNTER — Encounter (HOSPITAL_COMMUNITY): Payer: Self-pay | Admitting: *Deleted

## 2011-05-01 LAB — BASIC METABOLIC PANEL
Calcium: 8.2 mg/dL — ABNORMAL LOW (ref 8.4–10.5)
GFR calc non Af Amer: >90 mL/min (ref >90)
Glucose, Bld: 93 mg/dL (ref 70–99)
Sodium: 134 mEq/L — ABNORMAL LOW (ref 135–145)

## 2011-05-01 LAB — CBC
MCH: 30.7 pg (ref 26.0–34.0)
Platelets: 135 10*3/uL — ABNORMAL LOW (ref 150–400)
RBC: 4.27 MIL/uL (ref 3.87–5.11)
WBC: 4.5 10*3/uL (ref 4.0–10.5)

## 2011-05-01 LAB — GLUCOSE, CAPILLARY
Glucose-Capillary: 91 mg/dL (ref 70–99)
Glucose-Capillary: 91 mg/dL (ref 70–99)

## 2011-05-01 LAB — PROTIME-INR
INR: 3.47 — ABNORMAL HIGH (ref 0.00–1.49)
Prothrombin Time: 35.4 seconds — ABNORMAL HIGH (ref 11.6–15.2)

## 2011-05-01 MED ORDER — SODIUM CHLORIDE 0.9 % IV BOLUS (SEPSIS)
500.0000 mL | Freq: Once | INTRAVENOUS | Status: AC
  Administered 2011-05-01: 500 mL via INTRAVENOUS

## 2011-05-01 MED ORDER — HALOPERIDOL LACTATE 5 MG/ML IJ SOLN
2.5000 mg | INTRAMUSCULAR | Status: AC
  Administered 2011-05-01: 2.5 mg via INTRAVENOUS
  Filled 2011-05-01: qty 1

## 2011-05-01 MED ORDER — GADOBENATE DIMEGLUMINE 529 MG/ML IV SOLN
10.0000 mL | Freq: Once | INTRAVENOUS | Status: AC | PRN
  Administered 2011-05-01: 10 mL via INTRAVENOUS

## 2011-05-01 MED ORDER — POTASSIUM CHLORIDE IN NACL 20-0.9 MEQ/L-% IV SOLN: INTRAVENOUS | Status: DC

## 2011-05-01 MED ORDER — VANCOMYCIN HCL 1000 MG IV SOLR
750.0000 mg | Freq: Two times a day (BID) | INTRAVENOUS | Status: DC
  Administered 2011-05-02: 750 mg via INTRAVENOUS
  Filled 2011-05-01 (×3): qty 750

## 2011-05-01 MED ORDER — FLUCONAZOLE 100 MG PO TABS
50.0000 mg | ORAL_TABLET | Freq: Every day | ORAL | Status: DC
  Administered 2011-05-01 – 2011-05-02 (×2): 50 mg via ORAL
  Filled 2011-05-01 (×2): qty 1

## 2011-05-01 MED ORDER — METOPROLOL TARTRATE 25 MG PO TABS
25.0000 mg | ORAL_TABLET | Freq: Two times a day (BID) | ORAL | Status: DC
  Administered 2011-05-01 – 2011-05-02 (×2): 25 mg via ORAL
  Filled 2011-05-01 (×2): qty 1

## 2011-05-01 MED ORDER — SODIUM CHLORIDE 0.9 % IV SOLN
INTRAVENOUS | Status: DC
  Administered 2011-05-01: 05:00:00 via INTRAVENOUS
  Filled 2011-05-01 (×3): qty 1000

## 2011-05-01 NOTE — Progress Notes (Addendum)
Clinical Social Work:  Received consult for [facility] for short term rehab due to deconditioning and medical problems.  Patient lying in bed and reports she wants her family involved and CSW attempted to call patient [facility] but could not reach and no way of leaving a message.  Patient reports she is really weak, but will not make a decision.  Will continue to work to speak with family and help with dc planning.  Andrea [name], MSW LCSW [phone]  Have started placement referral, but until family reached or patient able to make her own decision will not fax out.  Will follow up. HN

## 2011-05-01 NOTE — Evaluation (Signed)
Clinical/Bedside Swallow Evaluation Patient Details  Name: KYLIN GENNA MRN: [id number] DOB: 03-Jul-1935 Today's Date: 05/01/2011  Past Medical History:  Past Medical History  Diagnosis Date  . Hypertension   . Stroke   . Acid reflux   . Atrial fibrillation   . Cancer     facial  . Tobacco abuse 04/30/2011   Past Surgical History:  Past Surgical History  Procedure Date  . Cholecystectomy   . Skin cancer excision   . Tonsillectomy    HPI:  Mrs. Neyman was admitted to [facility] under suspicion of having a new stroke. She is known to me from out patient therapy from her previous stroke. MRI shows no new acute event.   Assessment/Recommendations/Treatment Plan    SLP Assessment Clinical Impression Statement: Overall swallow function appears [facility] for consistencies presented. Pt wears dentures and prefers mech soft diet. Risk for Aspiration: Mild Other Related Risk Factors: Previous CVA  Swallow Evaluation Recommendations Solid Consistency: Dysphagia 3 (Mechanical soft) Liquid Consistency: Thin Liquid Administration via: Cup;Straw Medication Administration: Whole meds with liquid Supervision: Patient able to self feed Compensations: Slow rate;Small sips/bites Postural Changes and/or Swallow Maneuvers: Seated upright 90 degrees;Upright 30-60 min after meal Oral Care Recommendations: Oral care BID Other Recommendations: Clarify dietary restrictions Follow up Recommendations: Home health SLP;Outpatient SLP  Treatment Plan Treatment Plan Recommendations: Other (Comment) (pending speech eval in am)  Prognosis Prognosis for Safe Diet Advancement: Good  Individuals Consulted Consulted and Agree with Results and Recommendations: Patient;Family member/caregiver Family Member Consulted: husband   Swallow Study Prior Functional Status   Pt consumed soft diet PTA.  General  Date of Onset: 04/29/11 HPI: Mrs. Riedesel was admitted to [facility] under suspicion of having a new stroke.  She is known to me from out patient therapy from her previous stroke. MRI shows no new acute event. Type of Study: Bedside swallow evaluation Diet Prior to this Study:  (Full liquids) Temperature Spikes Noted: Yes (on Saturday, afebrile since) History of Intubation: No Behavior/Cognition: Alert;Cooperative Oral Cavity - Dentition: Dentures, top;Dentures, bottom Vision: Functional for self-feeding Patient Positioning: Upright in bed Baseline Vocal Quality: Clear Volitional Swallow: Able to elicit  Oral Motor/Sensory Function  Overall Oral Motor/Sensory Function: Appears within functional limits for tasks assessed  Consistency Results  Ice Chips Ice chips: Within functional limits  Thin Liquid Thin Liquid: Within functional limits Presentation: Self Fed;Straw  Nectar Thick Liquid Nectar Thick Liquid: Not tested  Honey Thick Liquid Honey Thick Liquid: Not tested  Puree Puree: Within functional limits Presentation: Spoon;Self Fed  Solid Solid: Within functional limits Other Comments: Pt prefers soft solids at home  [employer], CCC-SLP  Arling Cerone 05/01/2011,5:35 PM

## 2011-05-01 NOTE — Consult Note (Signed)
ANTIBIOTIC CONSULT NOTE  Pharmacy Consult for Vancomycin Indication: rule out pneumonia  Allergies  Allergen Reactions  . Contrast Media (Iodinated Diagnostic Agents)     Per pt chart.  . Iodine   . Macrobid   . Nitrofurantoin   . Penicillins   . Sulfa Antibiotics   . Sulfonamide Derivatives    Patient Measurements: Height: 5\' 3"  (160 cm) Weight: 124 lb 8 oz (56.473 kg) IBW/kg (Calculated) : 52.4   Vital Signs: Temp: 98.3 F (36.8 C) (03/11 1351) BP: 142/82 mmHg (03/11 1351) Pulse Rate: 84  (03/11 1351) Intake/Output from previous day: 03/10 0701 - 03/11 0700 In: 1363 [P.O.:560; I.V.:3; IV Piggyback:800] Out: 1100 [Urine:1100] Intake/Output from this shift: Total I/O In: 60 [P.O.:60] Out: 500 [Urine:500]  Labs:  [facility] 05/01/11 0514 04/30/11 0124 04/29/11 1738 04/29/11 1238  WBC 4.5 4.5 5.2 --  HGB 13.1 13.2 13.1 --  PLT 135* 152 161 --  LABCREA -- -- -- --  CREATININE 0.52 0.58 -- 0.80   Estimated Creatinine Clearance: 51.8 ml/min (by C-G formula based on Cr of 0.52).  Basename 05/01/11 1344  VANCOTROUGH 6.1*  VANCOPEAK --  Drue Dun --  GENTTROUGH --  GENTPEAK --  GENTRANDOM --  TOBRATROUGH --  TOBRAPEAK --  TOBRARND --  AMIKACINPEAK --  AMIKACINTROU --  AMIKACIN --     Microbiology: Recent Results (from the past 720 hour(s))  CULTURE, BLOOD (ROUTINE X 2)     Status: Normal (Preliminary result)   Collection Time   04/29/11 12:45 PM      Component Value Range Status Comment   Specimen Description BLOOD RIGHT HAND   Final    Special Requests     Final    Value: BOTTLES DRAWN AEROBIC AND ANAEROBIC 6CC EACH BOTTLE   Culture NO GROWTH 2 DAYS   Final    Report Status PENDING   Incomplete   CULTURE, BLOOD (ROUTINE X 2)     Status: Normal (Preliminary result)   Collection Time   04/29/11  1:00 PM      Component Value Range Status Comment   Specimen Description BLOOD LEFT ARM   Final    Special Requests     Final    Value: BOTTLES DRAWN AEROBIC  AND ANAEROBIC 6CC EACH BOTTLE   Culture NO GROWTH 2 DAYS   Final    Report Status PENDING   Incomplete   URINE CULTURE     Status: Normal   Collection Time   04/29/11  3:06 PM      Component Value Range Status Comment   Specimen Description URINE, CATHETERIZED   Final    Special Requests NONE   Final    Culture  Setup Time [id number]   Final    Colony Count NO GROWTH   Final    Culture NO GROWTH   Final    Report Status 04/30/2011 FINAL   Final   MRSA PCR SCREENING     Status: Normal   Collection Time   04/29/11  8:03 PM      Component Value Range Status Comment   MRSA by PCR NEGATIVE  NEGATIVE  Final    Medical History: Past Medical History  Diagnosis Date  . Hypertension   . Stroke   . Acid reflux   . Atrial fibrillation   . Cancer     facial  . Tobacco abuse 04/30/2011   Medications:  Scheduled:     . amLODipine  2.5 mg Oral Daily  . atorvastatin  10 mg Oral QHS  . benazepril  10 mg Oral BID  . famotidine  20 mg Oral Daily  . fluconazole  50 mg Oral Daily  . haloperidol lactate  2.5 mg Intravenous STAT  . levofloxacin (LEVAQUIN) IV  500 mg Intravenous Q24H  . metoprolol tartrate  12.5 mg Oral BID  . nicotine  21 mg Transdermal Daily  . oxybutynin  10 mg Oral Daily  . prazosin  1 mg Oral Daily  . sodium chloride  500 mL Intravenous Once  . trimethoprim  100 mg Oral Daily  . vancomycin  750 mg Intravenous Q12H  . Warfarin - Pharmacist Dosing Inpatient   Does not apply q1800  . zolpidem  5 mg Oral QHS  . DISCONTD: sodium chloride  3 mL Intravenous Q12H  . DISCONTD: vancomycin  1,000 mg Intravenous Q24H   Assessment: On Levaquin and Vancomycin empirically for pna Estimated Creatinine Clearance: 51.8 ml/min (by C-G formula based on Cr of 0.52). Trough below goal.  Goal of Therapy:  Vancomycin trough level 15-20 mcg/ml  Plan: Increase Vancomycin 750mg  iv q24hrs Check trough at steady state Labs per protocol  Mady Gemma 05/01/2011,4:03 PM

## 2011-05-01 NOTE — Progress Notes (Signed)
Subjective:  The patient has no new complaints. She would like to go home. Her husband is in the room. He states that she has been having a lot of nightmares lately.  Objective: Vital signs in last 24 hours: Filed Vitals:   05/01/11 0440 05/01/11 0528 05/01/11 0614 05/01/11 1351  BP: 161/101 149/96 134/66 142/82  Pulse: 110 99 90 84  Temp:    98.3 F (36.8 C)  TempSrc:      Resp:    18  Height:      Weight:      SpO2: 95% 94% 95% 97%    Intake/Output Summary (Last 24 hours) at 05/01/11 1622 Last data filed at 05/01/11 1300  Gross per 24 hour  Intake    923 ml  Output   1050 ml  Net   -127 ml    Weight change: 2.041 kg (4 lb 8 oz)  Physical exam: General: Pleasant alert 76 year old Caucasian woman lying in bed, in no acute distress. Lungs: Clear anteriorly with decreased breath sounds in the bases. Heart: Irregular, irregular. Abdomen: Positive bowel sounds, soft, nontender, nondistended. Extremities: No pedal edema. Neurologic: She is alert and oriented x2. She has expressive aphasia. She has mild dysarthria.    Lab Results: Basic Metabolic Panel:  Basename 05/01/11 0514 04/30/11 0124 04/29/11 1738  NA 134* 134* --  K 3.5 4.0 --  CL 103 104 --  CO2 19 21 --  GLUCOSE 93 90 --  BUN 13 15 --  CREATININE 0.52 0.58 --  CALCIUM 8.2* 8.1* --  MG -- -- 1.3*  PHOS -- -- 3.1   Liver Function Tests:  Basename 04/29/11 1238  AST 31  ALT 20  ALKPHOS 67  BILITOT 0.7  PROT 7.3  ALBUMIN 3.8   No results found for this basename: LIPASE:2,AMYLASE:2 in the last 72 hours No results found for this basename: AMMONIA:2 in the last 72 hours CBC:  Basename 05/01/11 0514 04/30/11 0124 04/29/11 1238  WBC 4.5 4.5 --  NEUTROABS -- -- 5.2  HGB 13.1 13.2 --  HCT 39.3 38.4 --  MCV 92.0 92.1 --  PLT 135* 152 --   Cardiac Enzymes:  Basename 04/30/11 0832 04/30/11 0124 04/29/11 1738  CKTOTAL 95 98 83  CKMB 2.2 2.1 2.1  CKMBINDEX -- -- --  TROPONINI <0.30 <0.30 <0.30    BNP:  Basename 04/29/11 1745  PROBNP 2061.0*   D-Dimer: No results found for this basename: DDIMER:2 in the last 72 hours CBG:  Basename 05/01/11 1302 05/01/11 1117 05/01/11 0720 04/30/11 2116  GLUCAP 79 76 89 79   Hemoglobin A1C:  Basename 04/29/11 1238  HGBA1C 5.6   Fasting Lipid Panel:  Basename 04/29/11 1238  CHOL 82  HDL 29*  LDLCALC 40  TRIG 66  CHOLHDL 2.8  LDLDIRECT --   Thyroid Function Tests:  Basename 04/29/11 1738  TSH 0.328*  T4TOTAL --  FREET4 --  T3FREE --  THYROIDAB --   Anemia Panel: No results found for this basename: VITAMINB12,FOLATE,FERRITIN,TIBC,IRON,RETICCTPCT in the last 72 hours Coagulation:  Basename 05/01/11 0514 04/30/11 0832  LABPROT 35.4* 39.4*  INR 3.47* 3.98*   Urine Drug Screen: Drugs of Abuse  No results found for this basename: labopia,  cocainscrnur,  labbenz,  amphetmu,  thcu,  labbarb    Alcohol Level: No results found for this basename: ETH:2 in the last 72 hours Urinalysis:  Basename 04/30/11 1548 04/29/11 1506  COLORURINE YELLOW YELLOW  LABSPEC 1.025 1.020  PHURINE 6.0 6.0  GLUCOSEU  NEGATIVE NEGATIVE  HGBUR MODERATE* TRACE*  BILIRUBINUR NEGATIVE NEGATIVE  KETONESUR NEGATIVE NEGATIVE  PROTEINUR NEGATIVE NEGATIVE  UROBILINOGEN 0.2 0.2  NITRITE NEGATIVE NEGATIVE  LEUKOCYTESUR NEGATIVE NEGATIVE   Misc. Labs:  Micro: Recent Results (from the past 240 hour(s))  CULTURE, BLOOD (ROUTINE X 2)     Status: Normal (Preliminary result)   Collection Time   04/29/11 12:45 PM      Component Value Range Status Comment   Specimen Description BLOOD RIGHT HAND   Final    Special Requests     Final    Value: BOTTLES DRAWN AEROBIC AND ANAEROBIC 6CC EACH BOTTLE   Culture NO GROWTH 2 DAYS   Final    Report Status PENDING   Incomplete   CULTURE, BLOOD (ROUTINE X 2)     Status: Normal (Preliminary result)   Collection Time   04/29/11  1:00 PM      Component Value Range Status Comment   Specimen Description BLOOD LEFT ARM    Final    Special Requests     Final    Value: BOTTLES DRAWN AEROBIC AND ANAEROBIC 6CC EACH BOTTLE   Culture NO GROWTH 2 DAYS   Final    Report Status PENDING   Incomplete   URINE CULTURE     Status: Normal   Collection Time   04/29/11  3:06 PM      Component Value Range Status Comment   Specimen Description URINE, CATHETERIZED   Final    Special Requests NONE   Final    Culture  Setup Time [id number]   Final    Colony Count NO GROWTH   Final    Culture NO GROWTH   Final    Report Status 04/30/2011 FINAL   Final   MRSA PCR SCREENING     Status: Normal   Collection Time   04/29/11  8:03 PM      Component Value Range Status Comment   MRSA by PCR NEGATIVE  NEGATIVE  Final     Studies/Results: Ct Head Wo Contrast  04/29/2011  *RADIOLOGY REPORT*  Clinical Data: 76 year old female with altered mental status.  CT HEAD WITHOUT CONTRAST  Technique:  Contiguous axial images were obtained from the base of the skull through the vertex without contrast.  Comparison: 07/29/2010  Findings: Mild chronic small vessel white matter ischemic changes are again identified.  There is an equivocal area of decreased attenuation involving the cortex within the posterior left frontal lobe and an acute-subacute infarct is difficult to exclude although this could be volume averaging through the fissure.  There is no evidence of mass lesion, mass effect, hydrocephalus, extra-axial fluid collection or hemorrhage.  The visualized bony calvarium is unremarkable.  IMPRESSION: Questionable decreased attenuation within the posterior left frontal lobe which could represent an acute to subacute infarct or volume averaging through the fissure.  Consider MRI for further evaluation as clinically indicated.  Mild chronic small vessel white matter ischemic changes.  Original Report Authenticated By: Rosendo Gros, M.D.   Mr Laqueta Jean Wo Contrast  05/01/2011  *RADIOLOGY REPORT*  Clinical Data: Headaches and confusion.  Stroke.  MRI HEAD  WITHOUT AND WITH CONTRAST  Technique:  Multiplanar, multiecho pulse sequences of the brain and surrounding structures were obtained according to standard protocol without and with intravenous contrast  Contrast: 10mL MULTIHANCE GADOBENATE DIMEGLUMINE 529 MG/ML IV SOLN  Comparison: CT head without contrast 04/29/2011 at [facility].  MRI brain 08/14/2007 at [facility].  Findings: The  diffusion weighted images demonstrate no evidence for acute or subacute infarction.  The posterior left frontal lobe infarct is remote.  Periventricular and subcortical white matter disease has progressed significantly since the prior MRI study.  The ventricles are of normal size.  No significant extra-axial fluid collection is present.  Flow is present in the major intracranial arteries.  The patient is status post bilateral lens extractions.  The paranasal sinuses are clear.  There is some fluid in the mastoid air cells bilaterally. No obstructing nasopharyngeal lesion is evident.  IMPRESSION:  1.  No acute infarct. 2.  Left frontal infarcts is new since the prior MRI, but is not acute. 3.  Significant progression of periventricular subcortical white matter disease, likely representing the sequelae of microvascular ischemia.  Original Report Authenticated By: Jamesetta Orleans. MATTERN, M.D.   US Carotid Duplex Bilateral  04/30/2011  *RADIOLOGY REPORT*  Clinical Data: Hypertension, stroke and tobacco use.  BILATERAL CAROTID DUPLEX ULTRASOUND  Technique: Wallace Cullens scale imaging, color Doppler and duplex ultrasound was performed of bilateral carotid and vertebral arteries in the neck.  Comparison:  None.  Criteria:  Quantification of carotid stenosis is based on velocity parameters that correlate the residual internal carotid diameter with NASCET-based stenosis levels, using the diameter of the distal internal carotid lumen as the denominator for stenosis measurement.  The following velocity measurements were obtained:                    PEAK SYSTOLIC/END DIASTOLIC RIGHT ICA:                        195/18cm/sec CCA:                        45/12cm/sec SYSTOLIC ICA/CCA RATIO:     4.4 DIASTOLIC ICA/CCA RATIO:    1.5 ECA:                        182cm/sec  LEFT ICA:                        113/8cm/sec CCA:                        48/7cm/sec SYSTOLIC ICA/CCA RATIO:     2.4 DIASTOLIC ICA/CCA RATIO:    1.2 ECA:                        165cm/sec  Findings:  RIGHT CAROTID ARTERY: Moderately severe plaque burden identified at the right carotid bifurcation with predominately calcified plaque present at the level of the bulb and extending into both internal and external carotid arteries.  Estimated ICA stenosis is 50 - 69% based on velocities.  There is turbulent flow at the level of the ICA origin.  RIGHT VERTEBRAL ARTERY:  Antegrade flow with normal wave form.  LEFT CAROTID ARTERY: Moderate partially calcified plaque noted at the level of the distal bulb and proximal ICA.  Estimated left ICA stenosis is less than 50%.  LEFT VERTEBRAL ARTERY:  Antegrade flow with normal wave form.  IMPRESSION: Significant atherosclerotic plaque at both carotid bifurcations, right greater than left.  Right ICA stenosis estimated at 50 - 69% and left ICA stenosis less than 50% based on duplex ultrasound.  Original Report Authenticated By: Reola Calkins, M.D.    Medications: I have reviewed the patient's current  medications.  Assessment: Principal Problem:  *Aphasia Active Problems:  Supratherapeutic INR  Atrial fibrillation  UTI (lower urinary tract infection)  Hypotension  Encephalopathy  Tobacco abuse   1. Encephalopathy. Likely secondary to hypotension and urinary tract infection. Also consider an acute or subacute stroke as a possibility given the results of the CT scan of the head. No cerebral bleeding noted. Her encephalopathy has resolved.  Reported worsening aphasia. Not sure what her baseline is. She does have some difficulty speaking,  however her speech is understandable. Her speech appears to be back to baseline according to her husband. MRI reveals no acute strokes. She does have carotid stenosis but this is being treated medically.  History of previous stroke with right-sided hemiparesis and expressive aphasia. The MRI this morning reveals interval new strokes but no acute strokes. She is anticoagulated with Coumadin.  Urinary tract infection. There was also concern about a possible superimposed aspiration pneumonia. She is currently being treated broad spectrum with Levaquin and vancomycin for now. Added Diflucan for yeast in the urine.  Hypotension in the setting of history of hypertension. Now resolved after approximately 3 L of IV fluids. Her blood pressure is trending up. We'll titrate some of her medications back up to prehospital doses.  Coagulopathy/supratherapeutic INR. Coumadin is currently on hold. PT/INR is pending. No evidence of bleeding.  Chronic atrial fibrillation. Her rate is currently controlled. She is anticoagulated as above.  Tobacco abuse. Advised to stop.  Mildly low TSH. We'll check a free T4.  Plan:  1. We'll await the evaluation and recommendations by the speech therapist. 2. Will decrease the IV fluids. 3. We'll titrate back up some of her antihypertensive medications. 4. Will check a free T4. We'll check the results of the 2-D echocardiogram pending. 5. Possible discharge tomorrow. Either home with physical therapy or short-term skilled nursing facility placement if the patient agrees.   LOS: 2 days   Geana Walts 05/01/2011, 4:22 PM

## 2011-05-01 NOTE — Consult Note (Addendum)
ANTICOAGULATION CONSULT NOTE Pharmacy Consult for Warfarin Indication: atrial fibrillation  Allergies  Allergen Reactions  . Contrast Media (Iodinated Diagnostic Agents)     Per pt chart.  . Iodine   . Macrobid   . Nitrofurantoin   . Penicillins   . Sulfa Antibiotics   . Sulfonamide Derivatives     Patient Measurements: Height: 5\' 3"  (160 cm) Weight: 124 lb 8 oz (56.473 kg) IBW/kg (Calculated) : 52.4   Vital Signs: BP: 134/66 mmHg (03/11 0614) Pulse Rate: 90  (03/11 0614)  Labs:  Basename 05/01/11 0514 04/30/11 0832 04/30/11 0124 04/29/11 1738 04/29/11 1238  HGB 13.1 -- 13.2 -- --  HCT 39.3 -- 38.4 38.1 --  PLT 135* -- 152 161 --  APTT -- -- -- -- --  LABPROT 35.4* 39.4* -- -- 39.7*  INR 3.47* 3.98* -- -- 4.02*  HEPARINUNFRC -- -- -- -- --  CREATININE 0.52 -- 0.58 -- 0.80  CKTOTAL -- 95 98 83 --  CKMB -- 2.2 2.1 2.1 --  TROPONINI -- <0.30 <0.30 <0.30 --   Estimated Creatinine Clearance: 51.8 ml/min (by C-G formula based on Cr of 0.52).  Medical History: Past Medical History  Diagnosis Date  . Hypertension   . Stroke   . Acid reflux   . Atrial fibrillation   . Cancer     facial  . Tobacco abuse 04/30/2011    Medications:  Prescriptions prior to admission  Medication Sig Dispense Refill  . amLODipine (NORVASC) 2.5 MG tablet Take 2.5 mg by mouth daily.      Marland Kitchen atorvastatin (LIPITOR) 10 MG tablet Take 10 mg by mouth at bedtime.      . benazepril (LOTENSIN) 20 MG tablet Take 20 mg by mouth 2 (two) times daily.      Marland Kitchen estazolam (PROSOM) 2 MG tablet Take 2 mg by mouth at bedtime.      . metoprolol tartrate (LOPRESSOR) 25 MG tablet Take 25 mg by mouth 2 (two) times daily.      Marland Kitchen oxybutynin (DITROPAN-XL) 10 MG 24 hr tablet Take 10 mg by mouth daily.      . prazosin (MINIPRESS) 2 MG capsule Take 2 mg by mouth daily.      Marland Kitchen trimethoprim (TRIMPEX) 100 MG tablet Take 100 mg by mouth daily.        Assessment: INR is supra-therapeutic  Goal of Therapy:  INR 2-3   Plan: No warfarin today INR daily until stable  Andrea [name] 05/01/2011,1:39 PM

## 2011-05-01 NOTE — Progress Notes (Signed)
*  PRELIMINARY RESULTS* Echocardiogram 2D Echocardiogram has been performed.  Andrea [name] 05/01/2011, 10:34 AM

## 2011-05-01 NOTE — Evaluation (Signed)
Physical Therapy Evaluation Patient Details Name: Andrea [name] MRN: [id number] DOB: May 03, 1935 Today's Date: 05/01/2011  Problem List:  Patient Active Problem List  Diagnoses  . ANKLE SPRAIN, LEFT  . CONTUSION, LOWER LEG, LEFT  . Dysarthria following cerebrovascular accident  . Aphasia  . Supratherapeutic INR  . Atrial fibrillation  . UTI (lower urinary tract infection)  . Hypotension  . Encephalopathy  . Tobacco abuse    Past Medical History:  Past Medical History  Diagnosis Date  . Hypertension   . Stroke   . Acid reflux   . Atrial fibrillation   . Cancer     facial  . Tobacco abuse 04/30/2011   Past Surgical History:  Past Surgical History  Procedure Date  . Cholecystectomy   . Skin cancer excision   . Tonsillectomy     PT Assessment/Plan/Recommendation PT Assessment Clinical Impression Statement: pt is alert and cooperative, mild expressive aphasia...still confused but able to follow directions...she states that she had been able to ambulate with a walker PTA but was often in a w/c...she has bilateral foot drop, but denies wearing any bracing for it...she has a severe kyphoscoliosis which in part causes her to lean R, but leaning could be a result of stroke as well...she was a bit dizzy in sitting posture and was afraid to try to stand  and overall coordination is impaired.Marland KitchenMarland KitchenI do not have a clear picture of her prior functional status, but if, in fact, she could ambulate with a walker then I will recommend SNF at d/c           PT Recommendation/Assessment: Patient will need skilled PT in the acute care venue PT Problem List: Decreased strength;Decreased activity tolerance;Decreased balance;Decreased mobility;Decreased coordination;Decreased cognition Barriers to Discharge Comments: unknown PT Therapy Diagnosis : Difficulty walking;Generalized weakness PT Plan PT Frequency: Min 3X/week PT Treatment/Interventions: Gait training;Functional mobility  training;Therapeutic activities;Therapeutic exercise;Balance training;Patient/family education PT Recommendation Follow Up Recommendations: Skilled nursing facility Equipment Recommended: Defer to next venue PT Goals  Acute Rehab PT Goals PT Goal Formulation: With patient Time For Goal Achievement: 2 weeks Pt will Roll Supine to Right Side: with supervision PT Goal: Rolling Supine to Right Side - Progress: Goal set today Pt will go Supine/Side to Sit: with supervision PT Goal: Supine/Side to Sit - Progress: Goal set today Pt will go Sit to Supine/Side: with supervision PT Goal: Sit to Supine/Side - Progress: Goal set today Pt will go Sit to Stand: with min assist PT Goal: Sit to Stand - Progress: Goal set today Pt will go Stand to Sit: with min assist PT Goal: Stand to Sit - Progress: Goal set today Pt will Transfer Bed to Chair/Chair to Bed: with mod assist PT Transfer Goal: Bed to Chair/Chair to Bed - Progress: Goal set today Pt will Ambulate: 1 - 15 feet;with max assist;with rolling walker PT Goal: Ambulate - Progress: Goal set today  PT Evaluation Precautions/Restrictions  Precautions Precautions: Fall Required Braces or Orthoses: No Restrictions Weight Bearing Restrictions: No Other Position/Activity Restrictions: pt has severe ankle weakness bilaterally...standing without stabilization of ankles may put her at risk, but she states that she doesn't have any baracing for ankles Prior Functioning  Home Living Lives With: Spouse Home Adaptive Equipment: Walker - rolling;Wheelchair - Pharmacist, hospital Comments: pt's memory is poor and she is unable to give a detailed account of prior hx Prior 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 Arousal/Alertness: Awake/alert Overall Cognitive Status: Appears within functional limits for tasks assessed Orientation  Level: Oriented to person;Disoriented to place;Disoriented to time;Disoriented to  situation Sensation/Coordination Sensation Light Touch: Appears Intact Stereognosis: Not tested Hot/Cold: Not tested Proprioception: Not tested Coordination Gross Motor Movements are Fluid and Coordinated: No Fine Motor Movements are Fluid and Coordinated: No Coordination and Movement Description: pt exhibits poor coordination of both UEs and LEs Finger Nose Finger Test: fair performance bilaterally Heel Shin Test: unable to complete bilaterally Extremity Assessment RUE Assessment RUE Assessment: Exceptions to [facility] RUE Strength RUE Overall Strength Comments: generally 3/5 strength within ROM of shoulder (to 90 deg).Marland KitchenMarland Kitchenappears to have joint deformity of hand LUE Assessment LUE Assessment: Within Functional Limits RLE Assessment RLE Assessment: Exceptions to [facility] RLE Strength RLE Overall Strength Comments: strength generally 3+/5 at hip, knee but 0/5 at ankle LLE Assessment LLE Assessment: Exceptions to [facility] LLE Strength LLE Overall Strength Comments: generally 3+/5 strength at hip and knee, 0/5 at ankle Mobility (including Balance) Bed Mobility Bed Mobility: Yes Rolling Right: 4: Min assist Right Sidelying to Sit: 3: Mod assist Sitting - Scoot to Edge of Bed: 4: Min assist Sit to Supine: 4: Min assist Transfers Transfers: No (refuses to try...says she can't stand) Ambulation/Gait Ambulation/Gait: No (pt refuses to try to stand) Stairs: No Wheelchair Mobility Wheelchair Mobility: No  Posture/Postural Control Posture/Postural Control: Postural limitations Postural Limitations: severe kyphoscoliosis with trunk leaning R...also appears to have 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

## 2011-05-02 ENCOUNTER — Encounter (HOSPITAL_COMMUNITY): Payer: Self-pay | Admitting: Internal Medicine

## 2011-05-02 ENCOUNTER — Inpatient Hospital Stay (HOSPITAL_COMMUNITY): Payer: Medicare Other

## 2011-05-02 DIAGNOSIS — I6529 Occlusion and stenosis of unspecified carotid artery: Secondary | ICD-10-CM

## 2011-05-02 DIAGNOSIS — I429 Cardiomyopathy, unspecified: Secondary | ICD-10-CM | POA: Diagnosis present

## 2011-05-02 DIAGNOSIS — I517 Cardiomegaly: Secondary | ICD-10-CM

## 2011-05-02 HISTORY — DX: Cardiomegaly: I51.7

## 2011-05-02 HISTORY — DX: Occlusion and stenosis of unspecified carotid artery: I65.29

## 2011-05-02 LAB — PROTIME-INR: INR: 2.63 — ABNORMAL HIGH (ref 0.00–1.49)

## 2011-05-02 MED ORDER — LEVOFLOXACIN 500 MG PO TABS: 500.0000 mg | ORAL_TABLET | Freq: Every day | ORAL | Status: AC

## 2011-05-02 MED ORDER — WARFARIN SODIUM 2 MG PO TABS: 4.0000 mg | ORAL_TABLET | Freq: Once | ORAL | Status: DC

## 2011-05-02 MED ORDER — WARFARIN SODIUM 4 MG PO TABS: ORAL_TABLET | ORAL | Status: DC

## 2011-05-02 MED ORDER — BENAZEPRIL HCL 10 MG PO TABS
20.0000 mg | ORAL_TABLET | Freq: Two times a day (BID) | ORAL | Status: DC
  Administered 2011-05-02: 20 mg via ORAL
  Filled 2011-05-02: qty 2

## 2011-05-02 MED ORDER — FLUCONAZOLE 50 MG PO TABS: 50.0000 mg | ORAL_TABLET | Freq: Every day | ORAL | Status: AC

## 2011-05-02 MED ORDER — POTASSIUM CHLORIDE ER 10 MEQ PO TBCR: 10.0000 meq | EXTENDED_RELEASE_TABLET | Freq: Two times a day (BID) | ORAL | Status: DC

## 2011-05-02 NOTE — Progress Notes (Signed)
Physical Therapy Treatment Patient Details Name: CONLEIGH HEINLEIN MRN: [id number] DOB: 1935/03/04 Today's Date: 05/02/2011  TIME: 1030-1058/1 GT 1 TE  PT Assessment/Plan  PT - Assessment/Plan Comments on Treatment Session: Pt completed all exercises well today and was able to complete 20' of gait training Min A with RW. Pt states that she really wants to go home and have therapy there as opposed to a SNF and that her husband is retired and able to assist her. She does use a w/c and 4 wheeled walker at home. Balance upon standing and ambulating  is somewhat unsteady and this does put her at risk for falls. Pt was able to complete ADL's of bathroom use with Min A;needing assistance to stand from low surface PT Goals  Acute Rehab PT Goals PT Goal: Supine/Side to Sit - Progress: Met PT Goal: Sit to Supine/Side - Progress: Met PT Goal: Sit to Stand - Progress: Met PT Goal: Stand to Sit - Progress: Met PT Goal: Ambulate - Progress: Met  PT Treatment Precautions/Restrictions  Precautions Precautions: Fall Required Braces or Orthoses: No Restrictions Weight Bearing Restrictions: No Other Position/Activity Restrictions: pt has severe ankle weakness bilaterally...standing without stabilization of ankles may put her at risk, but she states that she doesn't have any baracing for ankles Mobility (including Balance) Bed Mobility Bed Mobility: Yes Supine to Sit: 6: Modified independent (Device/Increase time) Sitting - Scoot to Edge of Bed: 6: Modified independent (Device/Increase time) Transfers Transfers: Yes Sit to Stand: 4: Min assist Sit to Stand Details (indicate cue type and reason): verbal cues for hand placement and assistance with balancing Stand to Sit: 5: Supervision Stand to Sit Details: verbal cues for hand placment Ambulation/Gait Ambulation/Gait: Yes Ambulation/Gait Assistance: 4: Min assist Ambulation Distance (Feet): 20 Feet Assistive device: Rolling walker Gait Pattern:  Step-through pattern;Trunk flexed (exaggerated LLE hip flexion ) Stairs: No Wheelchair Mobility Wheelchair Mobility: No    Exercise  General Exercises - Upper Extremity Shoulder Flexion: Both;10 reps Shoulder Horizontal ABduction: Both;10 reps General Exercises - Lower Extremity Ankle Circles/Pumps:  (not able due to B foot drop) Quad Sets: Both;10 reps Gluteal Sets: 10 reps Short Arc Quad: Both;10 reps Long Arc Quad: 10 reps;Both Heel Slides: Both;10 reps Hip ABduction/ADduction: Both;10 reps;Supine Straight Leg Raises: Both;10 reps Other Exercises Other Exercises: sit <>stand x 7 End of Session PT - End of Session Equipment Utilized During Treatment: Gait belt Activity Tolerance: Patient tolerated treatment well Patient left: in bed;with call bell in reach;with bed alarm set General Behavior During Session: [facility] for tasks performed Cognition: [facility] for tasks performed  Nayleen Janosik ATKINSO 05/02/2011, 11:29 AM

## 2011-05-02 NOTE — Discharge Summary (Signed)
Physician Discharge Summary  DILPREET FAIRES MRN: [id number] DOB/AGE: [id number] 74 y.o.  PCP: Cassell Smiles., MD, MD   Admit date: 04/29/2011 Discharge date: 05/02/2011  Discharge Diagnoses:  1. Urinary tract infection. There was bacteria and yeast in her urine. Culture negative. 2. Suspicion of aspiration pneumonia, not clinically evident. 3. Interval new left cerebral frontal infarcts compared to previous MRI, but no evidence of a new infarction/stroke. 4. Hypotension on admission in the setting of a history of hypertension, secondary to hypovolemia and possibly SIRS. 5. Encephalopathy, multifactorial. 6. Acute on chronic expressive aphasia, likely secondary to hypotension and infection. The patient's speech was back to baseline. 7. Cardiomyopathy per 2-D echocardiogram with an ejection fraction of 45-50%, pulmonary hypertension, and and biatrial enlargement. 8. Chronic atrial fibrillation, on chronic Coumadin. 9. Supratherapeutic INR/coagulopathy. 10. Carotid artery stenosis. Right ICA stenosis was estimated to be 50-69% and left ICA stenosis was estimated to be less than 50%. 11. Ongoing tobacco abuse.    Medication List  As of 05/02/2011  2:10 PM   STOP taking these medications         trimethoprim 100 MG tablet      warfarin 5 MG tablet         TAKE these medications         amLODipine 2.5 MG tablet   Commonly known as: NORVASC   Take 2.5 mg by mouth daily.      atorvastatin 10 MG tablet   Commonly known as: LIPITOR   Take 10 mg by mouth at bedtime.      benazepril 20 MG tablet   Commonly known as: LOTENSIN   Take 20 mg by mouth 2 (two) times daily.      estazolam 2 MG tablet   Commonly known as: PROSOM   Take 2 mg by mouth at bedtime.      fluconazole 50 MG tablet   Commonly known as: DIFLUCAN   Take 1 tablet (50 mg total) by mouth daily. FOR YEAST INFECTION.      levofloxacin 500 MG tablet   Commonly known as: LEVAQUIN   Take 1 tablet (500 mg  total) by mouth daily. ANTIBIOTIC.      metoprolol tartrate 25 MG tablet   Commonly known as: LOPRESSOR   Take 25 mg by mouth 2 (two) times daily.      oxybutynin 10 MG 24 hr tablet   Commonly known as: DITROPAN-XL   Take 10 mg by mouth daily.      potassium chloride 10 MEQ tablet   Commonly known as: K-DUR   Take 1 tablet (10 mEq total) by mouth 2 (two) times daily. TAKE FOR 1 WEEK.      prazosin 2 MG capsule   Commonly known as: MINIPRESS   Take 2 mg by mouth daily.      warfarin 4 MG tablet   Commonly known as: COUMADIN   TAKE 1 TABLET DAILY  (OR AS DIRECTED BY YOUR PRIMARY CARE PHYSICIAN.)            Discharge Condition: Improved and stable.  Disposition: Home-Health Care Svc   Consults: None.   Significant Diagnostic Studies: Dg Chest 2 View  04/29/2011  *RADIOLOGY REPORT*  Clinical Data: Weakness and aphasia.  CHEST - 2 VIEW  Comparison: 07/29/2010.  Findings: The heart is enlarged but stable.  The mediastinal and hilar contours are unchanged.  Stable elevation of the left hemidiaphragm.  Mild chronic bronchitic type lung changes but no definite acute  infiltrate.  There is artifact across the right lower lung due to the patients arm.  IMPRESSION: Stable cardiac enlargement.  No definite acute pulmonary findings.  Original Report Authenticated By: P. Loralie Champagne, M.D.   Ct Head Wo Contrast  04/29/2011  *RADIOLOGY REPORT*  Clinical Data: 76 year old female with altered mental status.  CT HEAD WITHOUT CONTRAST  Technique:  Contiguous axial images were obtained from the base of the skull through the vertex without contrast.  Comparison: 07/29/2010  Findings: Mild chronic small vessel white matter ischemic changes are again identified.  There is an equivocal area of decreased attenuation involving the cortex within the posterior left frontal lobe and an acute-subacute infarct is difficult to exclude although this could be volume averaging through the fissure.  There is no  evidence of mass lesion, mass effect, hydrocephalus, extra-axial fluid collection or hemorrhage.  The visualized bony calvarium is unremarkable.  IMPRESSION: Questionable decreased attenuation within the posterior left frontal lobe which could represent an acute to subacute infarct or volume averaging through the fissure.  Consider MRI for further evaluation as clinically indicated.  Mild chronic small vessel white matter ischemic changes.  Original Report Authenticated By: Rosendo Gros, M.D.   Mr Laqueta Jean Wo Contrast  05/01/2011  *RADIOLOGY REPORT*  Clinical Data: Headaches and confusion.  Stroke.  MRI HEAD WITHOUT AND WITH CONTRAST  Technique:  Multiplanar, multiecho pulse sequences of the brain and surrounding structures were obtained according to standard protocol without and with intravenous contrast  Contrast: 10mL MULTIHANCE GADOBENATE DIMEGLUMINE 529 MG/ML IV SOLN  Comparison: CT head without contrast 04/29/2011 at [facility].  MRI brain 08/14/2007 at [facility].  Findings: The diffusion weighted images demonstrate no evidence for acute or subacute infarction.  The posterior left frontal lobe infarct is remote.  Periventricular and subcortical white matter disease has progressed significantly since the prior MRI study.  The ventricles are of normal size.  No significant extra-axial fluid collection is present.  Flow is present in the major intracranial arteries.  The patient is status post bilateral lens extractions.  The paranasal sinuses are clear.  There is some fluid in the mastoid air cells bilaterally. No obstructing nasopharyngeal lesion is evident.  IMPRESSION:  1.  No acute infarct. 2.  Left frontal infarcts is new since the prior MRI, but is not acute. 3.  Significant progression of periventricular subcortical white matter disease, likely representing the sequelae of microvascular ischemia.  Original Report Authenticated By: Jamesetta Orleans. MATTERN, M.D.   US Carotid Duplex  Bilateral  04/30/2011  *RADIOLOGY REPORT*  Clinical Data: Hypertension, stroke and tobacco use.  BILATERAL CAROTID DUPLEX ULTRASOUND  Technique: Wallace Cullens scale imaging, color Doppler and duplex ultrasound was performed of bilateral carotid and vertebral arteries in the neck.  Comparison:  None.  Criteria:  Quantification of carotid stenosis is based on velocity parameters that correlate the residual internal carotid diameter with NASCET-based stenosis levels, using the diameter of the distal internal carotid lumen as the denominator for stenosis measurement.  The following velocity measurements were obtained:                   PEAK SYSTOLIC/END DIASTOLIC RIGHT ICA:                        195/18cm/sec CCA:                        45/12cm/sec SYSTOLIC ICA/CCA RATIO:  4.4 DIASTOLIC ICA/CCA RATIO:    1.5 ECA:                        182cm/sec  LEFT ICA:                        113/8cm/sec CCA:                        48/7cm/sec SYSTOLIC ICA/CCA RATIO:     2.4 DIASTOLIC ICA/CCA RATIO:    1.2 ECA:                        165cm/sec  Findings:  RIGHT CAROTID ARTERY: Moderately severe plaque burden identified at the right carotid bifurcation with predominately calcified plaque present at the level of the bulb and extending into both internal and external carotid arteries.  Estimated ICA stenosis is 50 - 69% based on velocities.  There is turbulent flow at the level of the ICA origin.  RIGHT VERTEBRAL ARTERY:  Antegrade flow with normal wave form.  LEFT CAROTID ARTERY: Moderate partially calcified plaque noted at the level of the distal bulb and proximal ICA.  Estimated left ICA stenosis is less than 50%.  LEFT VERTEBRAL ARTERY:  Antegrade flow with normal wave form.  IMPRESSION: Significant atherosclerotic plaque at both carotid bifurcations, right greater than left.  Right ICA stenosis estimated at 50 - 69% and left ICA stenosis less than 50% based on duplex ultrasound.  Original Report Authenticated By: Reola Calkins, M.D.     Dg Chest Port 1 View  05/02/2011  *RADIOLOGY REPORT*  Clinical Data: Cardiomyopathy  PORTABLE CHEST - 1 VIEW  Comparison: 04/29/2011  Findings: Mild cardiac enlargement.  There is asymmetric elevation of the left hemidiaphragm, chronic and unchanged from previous exam.  No pleural effusions or edema.  No airspace consolidation noted.  IMPRESSION:  1.  No active cardiopulmonary abnormalities. 2.  Chronic asymmetric elevation of the left hemidiaphragm.  Original Report Authenticated By: Rosealee Albee, M.D.   ECHO:Study Conclusions  - Left ventricle: Systolic function was mildly reduced. The estimated ejection fraction was in the range of 45% to 50%. Diffuse hypokinesis. Atrial fibrillation limits evaluation of LV diastolic function. However, E/e' ratio > 20 suggests high mean left atrial pressure. - Aortic valve: Trivial regurgitation. Valve area: 1.07cm^2(VTI). Valve area: 1.01cm^2 (Vmax). - Mitral valve: Mild regurgitation. - Left atrium: The atrium was severely dilated. - Right atrium: The atrium was severely dilated. - Atrial septum: No defect or patent foramen ovale was identified. - Pulmonary arteries: Systolic pressure was mildly increased. PA peak pressure: 36mm Hg (S). Transthoracic echocardiography. M-mode, complete 2D, spectral Doppler, and color Doppler. Height: Height: 160cm. Height: 63in. Weight: Weight: 56.2kg. Weight: 123.7lb. Body mass index: BMI: 22kg/m^2. Body surface area: BSA: 1.54m^2. Patient status: Inpatient. Location: Bedside.     Microbiology: Recent Results (from the past 240 hour(s))  CULTURE, BLOOD (ROUTINE X 2)     Status: Normal (Preliminary result)   Collection Time   04/29/11 12:45 PM      Component Value Range Status Comment   Specimen Description BLOOD RIGHT HAND   Final    Special Requests     Final    Value: BOTTLES DRAWN AEROBIC AND ANAEROBIC 6CC EACH BOTTLE   Culture NO GROWTH 3 DAYS   Final    Report Status PENDING   Incomplete    CULTURE,  BLOOD (ROUTINE X 2)     Status: Normal (Preliminary result)   Collection Time   04/29/11  1:00 PM      Component Value Range Status Comment   Specimen Description BLOOD LEFT ARM   Final    Special Requests     Final    Value: BOTTLES DRAWN AEROBIC AND ANAEROBIC 6CC EACH BOTTLE   Culture NO GROWTH 3 DAYS   Final    Report Status PENDING   Incomplete   URINE CULTURE     Status: Normal   Collection Time   04/29/11  3:06 PM      Component Value Range Status Comment   Specimen Description URINE, CATHETERIZED   Final    Special Requests NONE   Final    Culture  Setup Time [id number]   Final    Colony Count NO GROWTH   Final    Culture NO GROWTH   Final    Report Status 04/30/2011 FINAL   Final   MRSA PCR SCREENING     Status: Normal   Collection Time   04/29/11  8:03 PM      Component Value Range Status Comment   MRSA by PCR NEGATIVE  NEGATIVE  Final      Labs: Results for orders placed during the hospital encounter of 04/29/11 (from the past 48 hour(s))  URINALYSIS, ROUTINE W REFLEX MICROSCOPIC     Status: Abnormal   Collection Time   04/30/11  3:48 PM      Component Value Range Comment   Color, Urine YELLOW  YELLOW     APPearance CLEAR  CLEAR     Specific Gravity, Urine 1.025  1.005 - 1.030     pH 6.0  5.0 - 8.0     Glucose, UA NEGATIVE  NEGATIVE (mg/dL)    Hgb urine dipstick MODERATE (*) NEGATIVE     Bilirubin Urine NEGATIVE  NEGATIVE     Ketones, ur NEGATIVE  NEGATIVE (mg/dL)    Protein, ur NEGATIVE  NEGATIVE (mg/dL)    Urobilinogen, UA 0.2  0.0 - 1.0 (mg/dL)    Nitrite NEGATIVE  NEGATIVE     Leukocytes, UA NEGATIVE  NEGATIVE    URINE MICROSCOPIC-ADD ON     Status: Normal   Collection Time   04/30/11  3:48 PM      Component Value Range Comment   Squamous Epithelial / LPF RARE  RARE     WBC, UA 3-6  <3 (WBC/hpf)    RBC / HPF 11-20  <3 (RBC/hpf)    Bacteria, UA RARE  RARE     Urine-Other YEAST     GLUCOSE, CAPILLARY     Status: Normal   Collection Time    04/30/11  9:16 PM      Component Value Range Comment   Glucose-Capillary 79  70 - 99 (mg/dL)   PROTIME-INR     Status: Abnormal   Collection Time   05/01/11  5:14 AM      Component Value Range Comment   Prothrombin Time 35.4 (*) 11.6 - 15.2 (seconds)    INR 3.47 (*) 0.00 - 1.49    BASIC METABOLIC PANEL     Status: Abnormal   Collection Time   05/01/11  5:14 AM      Component Value Range Comment   Sodium 134 (*) 135 - 145 (mEq/L)    Potassium 3.5  3.5 - 5.1 (mEq/L)    Chloride 103  96 - 112 (mEq/L)  CO2 19  19 - 32 (mEq/L)    Glucose, Bld 93  70 - 99 (mg/dL)    BUN 13  6 - 23 (mg/dL)    Creatinine, Ser 2.84  0.50 - 1.10 (mg/dL)    Calcium 8.2 (*) 8.4 - 10.5 (mg/dL)    GFR calc non Af Amer >90  >90 (mL/min)    GFR calc Af Amer >90  >90 (mL/min)   CBC     Status: Abnormal   Collection Time   05/01/11  5:14 AM      Component Value Range Comment   WBC 4.5  4.0 - 10.5 (K/uL)    RBC 4.27  3.87 - 5.11 (MIL/uL)    Hemoglobin 13.1  12.0 - 15.0 (g/dL)    HCT 13.2  44.0 - 10.2 (%)    MCV 92.0  78.0 - 100.0 (fL)    MCH 30.7  26.0 - 34.0 (pg)    MCHC 33.3  30.0 - 36.0 (g/dL)    RDW 72.5  36.6 - 44.0 (%)    Platelets 135 (*) 150 - 400 (K/uL)   GLUCOSE, CAPILLARY     Status: Normal   Collection Time   05/01/11  7:20 AM      Component Value Range Comment   Glucose-Capillary 89  70 - 99 (mg/dL)    Comment 1 Notify RN     GLUCOSE, CAPILLARY     Status: Normal   Collection Time   05/01/11 11:17 AM      Component Value Range Comment   Glucose-Capillary 76  70 - 99 (mg/dL)    Comment 1 Notify RN     GLUCOSE, CAPILLARY     Status: Normal   Collection Time   05/01/11  1:02 PM      Component Value Range Comment   Glucose-Capillary 79  70 - 99 (mg/dL)    Comment 1 Notify RN     VANCOMYCIN, TROUGH     Status: Abnormal   Collection Time   05/01/11  1:44 PM      Component Value Range Comment   Vancomycin Tr 6.1 (*) 10.0 - 20.0 (ug/mL)   GLUCOSE, CAPILLARY     Status: Normal   Collection Time    05/01/11  4:58 PM      Component Value Range Comment   Glucose-Capillary 91  70 - 99 (mg/dL)    Comment 1 Notify RN      Comment 2 Documented in Chart     GLUCOSE, CAPILLARY     Status: Normal   Collection Time   05/01/11  9:03 PM      Component Value Range Comment   Glucose-Capillary 91  70 - 99 (mg/dL)    Comment 1 Notify RN      Comment 2 Documented in Chart     PROTIME-INR     Status: Abnormal   Collection Time   05/02/11  5:27 AM      Component Value Range Comment   Prothrombin Time 28.5 (*) 11.6 - 15.2 (seconds)    INR 2.63 (*) 0.00 - 1.49    GLUCOSE, CAPILLARY     Status: Normal   Collection Time   05/02/11  7:21 AM      Component Value Range Comment   Glucose-Capillary 88  70 - 99 (mg/dL)   GLUCOSE, CAPILLARY     Status: Abnormal   Collection Time   05/02/11 11:20 AM      Component Value Range Comment   Glucose-Capillary  119 (*) 70 - 99 (mg/dL)    Comment 1 Notify RN        HPI : The patient is a 76 year old woman with a past medical history significant for chronic atrial fibrillation on chronic Coumadin therapy, history of previous strokes with aphasia and mild global weakness, and hypertension, who presented to the emergency department on March ninth 2013 with confusion. In the emergency department, her initial blood pressures were in the 80s systolically. Her temperature was 100.5. Her INR was 4.02 and her PT was 39.7. Her WBC was within normal limits at 6.2. Her troponin I was within normal limits. Her lactic acid was within normal limits at 1.0. Her EKG revealed atrial fibrillation with a heart rate of 85 beats per minute. Her chest x-ray revealed no acute cardiopulmonary findings. Her urinalysis revealed yeast, WBCs, RBCs, and bacteria. She was admitted for further evaluation and management.  HOSPITAL COURSE: The patient was started empirically on Levaquin and vancomycin. Although the yeast in her urine was thought to be a possible colonization, she was started on  low-dose Diflucan. All of her antihypertensive medications were either discontinued or the dosing was decreased. IV fluids were started for hydration. Her Coumadin was withheld because of an elevated INR of greater than 4. Blood cultures and a urine culture were ordered.  There was a suspicion of a superimposed new stroke and aspiration pneumonia. A number of studies were ordered for further evaluation. The MRI of her brain revealed interval new strokes compared to the previous MRI but no acute strokes. A 2-D echocardiogram revealed hypokinesis and ejection fraction of 45-50%. There was also evidence of biatrial enlargement and pulmonary hypertension. She does have metoprolol and benazepril as part of her medication regimen. Her carotid ultrasound revealed ICA stenosis estimated to be 50-69% on the right and less than 50% on the left. Her fasting lipid profile revealed total cholesterol of 82, triglycerides of 66, HDL cholesterol 29, and LDL cholesterol of 40. Her hemoglobin A1c was within normal limits at 5.6. Her TSH was within normal limits at 0.4. Her subsequent TSH decreased at 0.3-8, however, her free T4 was within normal limits at 1.08.  Her INR/PT was monitored daily. Once her INR decreased to below 3, Coumadin was restarted per pharmacy. Of note, there was no evidence of GI or GU bleeding during the hospitalization. Following hydration, her blood pressure improved and then later increased. All of her antihypertensive medications had to be restarted at the prehospital doses.  The speech therapist was consulted as there was a concern about dysphagia and worsening aphasia. The patient was well known to the speech therapist. Per the therapist evaluation, there was no obvious significant dysphagia or aspiration. The patient's speech returned to baseline according to her husband although, she does have chronic baseline expressive aphasia. The speech therapist also evaluated the patient. She recommended  short-term skilled nursing facility placement versus physical therapy at home. The patient was adamant about returning home with therapy rather than being transferred to a skilled facility. Her husband was in agreement. Home health physical therapy and a registered nurse were ordered at the time of discharge.  The patient improved clinically and symptomatically. She was discharged to home on several more days of antibiotic and antifungal therapy. The dose of Coumadin was decreased to 4 mg daily. She will followup with her primary care provider in 3 days for further evaluation. The patient was strongly advised to stop smoking.  Discharge Exam: Blood pressure 166/89, pulse 75, temperature  98.1 F (36.7 C), temperature source Oral, resp. rate 20, height 5\' 3"  (1.6 m), weight 56.473 kg (124 lb 8 oz), SpO2 96.00%. Lungs: Clear to auscultation bilaterally. Heart: S1, S2, no murmurs rubs or gallops. Abdomen: Positive bowel sounds, soft, nontender, nondistended. Extremities: No pedal edema. Neurologic: She has expressive aphasia, otherwise her cranial nerves appear to be intact.    Discharge Orders    Future Orders Please Complete By Expires   Diet - low sodium heart healthy      Increase activity slowly      Discharge instructions      Comments:   STOP SMOKING.      Follow-up Information    Follow up with Cassell Smiles., MD on 05/05/2011. (AT 2:00 PM WITH MR. [facility].)    Contact information:   [address] [place] [address] [phone]         Discharge time: 45 minutes.   Signed: Jkwon Treptow 05/02/2011, 2:10 PM

## 2011-05-02 NOTE — Progress Notes (Signed)
Discharge summary: a/o.vss. Saline lock removed. No complaints. Discharge instructions given. prescriptions given. Family verbalized understanding of instructions. Pt left floor via wheelchair with nursing staff and family members. Home health to follow after discharge.

## 2011-05-02 NOTE — Progress Notes (Signed)
ANTICOAGULATION CONSULT NOTE Pharmacy Consult for Warfarin Indication: atrial fibrillation  Allergies  Allergen Reactions  . Contrast Media (Iodinated Diagnostic Agents)     Per pt chart.  . Iodine   . Macrobid   . Nitrofurantoin   . Penicillins   . Sulfa Antibiotics   . Sulfonamide Derivatives     Patient Measurements: Height: 5\' 3"  (160 cm) Weight: 124 lb 8 oz (56.473 kg) IBW/kg (Calculated) : 52.4   Vital Signs: Temp: 98.1 F (36.7 C) (03/12 0604) BP: 166/89 mmHg (03/12 0604) Pulse Rate: 75  (03/12 0604)  Labs:  Andrea [name] 05/02/11 0527 05/01/11 0514 04/30/11 0832 04/30/11 0124 04/29/11 1738 04/29/11 1238  HGB -- 13.1 -- 13.2 -- --  HCT -- 39.3 -- 38.4 38.1 --  PLT -- 135* -- 152 161 --  APTT -- -- -- -- -- --  LABPROT 28.5* 35.4* 39.4* -- -- --  INR 2.63* 3.47* 3.98* -- -- --  HEPARINUNFRC -- -- -- -- -- --  CREATININE -- 0.52 -- 0.58 -- 0.80  CKTOTAL -- -- 95 98 83 --  CKMB -- -- 2.2 2.1 2.1 --  TROPONINI -- -- <0.30 <0.30 <0.30 --   Estimated Creatinine Clearance: 51.8 ml/min (by C-G formula based on Cr of 0.52).  Medical History: Past Medical History  Diagnosis Date  . Hypertension   . Stroke   . Acid reflux   . Atrial fibrillation   . Cancer     facial  . Tobacco abuse 04/30/2011  . Cardiomyopathy 05/02/2011    EF 45-50%, per Echo.  . 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 Carotid artery stenosis 05/02/2011    Medications:  Prescriptions prior to admission  Medication Sig Dispense Refill  . amLODipine (NORVASC) 2.5 MG tablet Take 2.5 mg by mouth daily.      Marland Kitchen atorvastatin (LIPITOR) 10 MG tablet Take 10 mg by mouth at bedtime.      . benazepril (LOTENSIN) 20 MG tablet Take 20 mg by mouth 2 (two) times daily.      Marland Kitchen estazolam (PROSOM) 2 MG tablet Take 2 mg by mouth at bedtime.      . metoprolol tartrate (LOPRESSOR) 25 MG tablet Take 25 mg by mouth 2 (two) times daily.      Marland Kitchen oxybutynin (DITROPAN-XL) 10 MG 24 hr tablet Take 10 mg by mouth  daily.      . prazosin (MINIPRESS) 2 MG capsule Take 2 mg by mouth daily.      Marland Kitchen trimethoprim (TRIMPEX) 100 MG tablet Take 100 mg by mouth daily.      Marland Kitchen warfarin (COUMADIN) 4 MG tablet Take 4 mg by mouth as directed. 4 mg on Monday, Tuesday, Wednesday and Thursday.      . warfarin (COUMADIN) 5 MG tablet Take 5 mg by mouth as directed. 5 mg on Friday, Saturday and Sunday per pharmacy        Assessment: INR is therapeutic  Goal of Therapy:  INR 2-3   Plan: Warfarin 4 mg today INR daily until stable  [place], [place] J 05/02/2011,9:31 AM

## 2011-05-02 NOTE — Progress Notes (Signed)
CARE MANAGEMENT NOTE 05/02/2011  Patient:  Art,Loria R   Account Number:  [id number]  Date Initiated:  05/02/2011  Documentation initiated by:  Lynnex Fulp  Subjective/Objective Assessment:   Pt admitted from home with spouse. Presented with stroke symptoms.     Action/Plan:   Pt will be dc'd back home with spouse and HH RN and PT   Anticipated DC Date:  05/02/2011   Anticipated DC Plan:  HOME W HOME HEALTH SERVICES      DC Planning Services  CM consult      Choice offered to / List presented to:          HH arranged  HH-1 RN  HH-10 DISEASE MANAGEMENT  HH-2 PT      HH agency  [facility]   Status of service:  Completed, signed off Medicare Important Message given?   (If response is "NO", the following Medicare IM given date fields will be blank) Date Medicare IM given:   Date Additional Medicare IM given:    Discharge Disposition:  HOME W HOME HEALTH SERVICES  Per UR Regulation:    If discussed at Long Length of Stay Meetings, dates discussed:    Comments:  05/02/11 1400 Caryssa Elzey RN BSN CM    

## 2011-05-04 LAB — CULTURE, BLOOD (ROUTINE X 2)
Culture: NO GROWTH
Culture: NO GROWTH

## 2011-05-30 ENCOUNTER — Encounter: Payer: Self-pay | Admitting: Gastroenterology

## 2011-06-05 ENCOUNTER — Telehealth: Payer: Self-pay | Admitting: General Practice

## 2011-06-05 NOTE — Telephone Encounter (Signed)
Patient called and wanted to speak with about a letter you mailed her. Please call her at her home number listed.Marland KitchenMarland Kitchen

## 2011-06-06 NOTE — Telephone Encounter (Signed)
I spoke with patient and her daughter about the purpose of the letter. They want to wait on having procedure due to patient being wheelchair bound from stroke. They are concerned about her being able to take the prep and getting to the bathroom. They agreed that they would call us if or when they are ready for colonoscopy. Patient does have a family history of colon cancer.

## 2011-06-15 ENCOUNTER — Other Ambulatory Visit (HOSPITAL_COMMUNITY): Payer: Self-pay | Admitting: Internal Medicine

## 2011-06-15 DIAGNOSIS — Z139 Encounter for screening, unspecified: Secondary | ICD-10-CM

## 2011-06-19 ENCOUNTER — Ambulatory Visit (HOSPITAL_COMMUNITY)
Admission: RE | Admit: 2011-06-19 | Discharge: 2011-06-19 | Disposition: A | Discharge status: Home / Self Care | Payer: Medicare Other | Source: Ambulatory Visit | Attending: Internal Medicine | Admitting: Internal Medicine

## 2011-06-19 DIAGNOSIS — Z139 Encounter for screening, unspecified: Secondary | ICD-10-CM

## 2011-06-19 DIAGNOSIS — Z1231 Encounter for screening mammogram for malignant neoplasm of breast: Secondary | ICD-10-CM | POA: Insufficient documentation

## 2011-10-02 ENCOUNTER — Emergency Department (HOSPITAL_COMMUNITY)
Admission: EM | Admit: 2011-10-02 | Discharge: 2011-10-03 | Disposition: A | Discharge status: Home / Self Care | Payer: Medicare Other | Attending: Emergency Medicine | Admitting: Emergency Medicine

## 2011-10-02 ENCOUNTER — Encounter (HOSPITAL_COMMUNITY): Payer: Self-pay | Admitting: Emergency Medicine

## 2011-10-02 DIAGNOSIS — Z8673 Personal history of transient ischemic attack (TIA), and cerebral infarction without residual deficits: Secondary | ICD-10-CM | POA: Insufficient documentation

## 2011-10-02 DIAGNOSIS — D6832 Hemorrhagic disorder due to extrinsic circulating anticoagulants: Secondary | ICD-10-CM

## 2011-10-02 DIAGNOSIS — I10 Essential (primary) hypertension: Secondary | ICD-10-CM | POA: Insufficient documentation

## 2011-10-02 DIAGNOSIS — R55 Syncope and collapse: Secondary | ICD-10-CM

## 2011-10-02 DIAGNOSIS — R0602 Shortness of breath: Secondary | ICD-10-CM | POA: Insufficient documentation

## 2011-10-02 DIAGNOSIS — I4891 Unspecified atrial fibrillation: Secondary | ICD-10-CM

## 2011-10-02 DIAGNOSIS — D689 Coagulation defect, unspecified: Secondary | ICD-10-CM | POA: Insufficient documentation

## 2011-10-02 DIAGNOSIS — T45515A Adverse effect of anticoagulants, initial encounter: Secondary | ICD-10-CM | POA: Insufficient documentation

## 2011-10-02 DIAGNOSIS — E876 Hypokalemia: Secondary | ICD-10-CM

## 2011-10-02 LAB — CBC WITH DIFFERENTIAL/PLATELET
Basophils Absolute: 0 10*3/uL (ref 0.0–0.1)
Eosinophils Absolute: 0.2 10*3/uL (ref 0.0–0.7)
Eosinophils Relative: 3 % (ref 0–5)
HCT: 42.9 % (ref 36.0–46.0)
Lymphocytes Relative: 47 % — ABNORMAL HIGH (ref 12–46)
MCH: 31.1 pg (ref 26.0–34.0)
MCV: 90.7 fL (ref 78.0–100.0)
Monocytes Absolute: 0.4 10*3/uL (ref 0.1–1.0)
Platelets: 165 10*3/uL (ref 150–400)
RDW: 14.4 % (ref 11.5–15.5)
WBC: 7.1 10*3/uL (ref 4.0–10.5)

## 2011-10-02 MED ORDER — SODIUM CHLORIDE 0.9 % IV SOLN
Freq: Once | INTRAVENOUS | Status: AC
  Administered 2011-10-03: via INTRAVENOUS

## 2011-10-02 MED ORDER — SODIUM CHLORIDE 0.9 % IV BOLUS (SEPSIS)
1000.0000 mL | INTRAVENOUS | Status: AC
  Administered 2011-10-03: 1000 mL via INTRAVENOUS

## 2011-10-02 NOTE — ED Notes (Signed)
On speaking with the husband, he states that the pt started complaining of her stomach hurting while in the bathroom, states that he went out for a minute and came back and found her unconscious, states that she was unconscious for about 10 minutes and not responding to anything but was breathing.  Husband states he called 911.

## 2011-10-02 NOTE — ED Notes (Signed)
States that she was feeling short of breath at home with nausea.  Husband states that she passed out.  Pt states she does not remember what happened to her.

## 2011-10-03 LAB — CBC WITH DIFFERENTIAL/PLATELET
Basophils Absolute: 0 10*3/uL (ref 0.0–0.1)
Basophils Relative: 0 % (ref 0–1)
Eosinophils Relative: 2 % (ref 0–5)
HCT: 42 % (ref 36.0–46.0)
MCHC: 34.8 g/dL (ref 30.0–36.0)
MCV: 90.9 fL (ref 78.0–100.0)
Monocytes Absolute: 0.6 10*3/uL (ref 0.1–1.0)
Neutro Abs: 8.4 10*3/uL — ABNORMAL HIGH (ref 1.7–7.7)
Platelets: 164 10*3/uL (ref 150–400)
RDW: 14.4 % (ref 11.5–15.5)

## 2011-10-03 LAB — BASIC METABOLIC PANEL
Calcium: 9.5 mg/dL (ref 8.4–10.5)
Creatinine, Ser: 0.61 mg/dL (ref 0.50–1.10)
GFR calc Af Amer: >90 mL/min (ref >90)
Sodium: 137 mEq/L (ref 135–145)

## 2011-10-03 LAB — APTT: aPTT: 38 seconds — ABNORMAL HIGH (ref 24–37)

## 2011-10-03 LAB — PROTIME-INR: Prothrombin Time: 20.1 seconds — ABNORMAL HIGH (ref 11.6–15.2)

## 2011-10-03 MED ORDER — PANTOPRAZOLE SODIUM 40 MG PO TBEC
40.0000 mg | DELAYED_RELEASE_TABLET | Freq: Once | ORAL | Status: AC
  Administered 2011-10-03: 40 mg via ORAL
  Filled 2011-10-03: qty 1

## 2011-10-03 MED ORDER — POTASSIUM CHLORIDE CRYS ER 20 MEQ PO TBCR
60.0000 meq | EXTENDED_RELEASE_TABLET | Freq: Once | ORAL | Status: AC
  Administered 2011-10-03: 60 meq via ORAL
  Filled 2011-10-03: qty 3

## 2011-10-03 MED ORDER — ONDANSETRON HCL 4 MG/2ML IJ SOLN: 4.0000 mg | Freq: Once | INTRAMUSCULAR | Status: DC

## 2011-10-03 MED ORDER — MORPHINE SULFATE 4 MG/ML IJ SOLN: 2.0000 mg | Freq: Once | INTRAMUSCULAR | Status: DC

## 2011-10-03 NOTE — ED Notes (Signed)
Assisted to bedside commode, unable to provide urine at this time.  Pt also refuses catheterization for urine.

## 2011-10-03 NOTE — ED Provider Notes (Signed)
History     CSN: [id number]  Arrival date & time 10/02/11  2238   First MD Initiated Contact with Patient 10/02/11 2338      Chief Complaint  Patient presents with  . Loss of Consciousness  . Shortness of Breath  . Nausea    (Consider location/radiation/quality/duration/timing/severity/associated sxs/prior treatment) HPI  Andrea [name] is a 76 y.o. female who presents to the Emergency Department complaining of fainting while on the toilet. Patient states she ate a late dinner (checken, corn, mashed potatoes, coleslaw), developed severe abdominal cramping and went to the bathroom. She had a bowel movement with some relief. Husband checked on her and she was unresponsive leaning against the wall. Patient states she fell asleep. She had not taken her evening pills. Abdominal pain has been relieved. She denies nausea, vomiting, diarrhea.  PCP Dr. Sherwood Gambler  Past Medical History  Diagnosis Date  . Hypertension   . Stroke   . Acid reflux   . Atrial fibrillation   . Cancer     facial  . Tobacco abuse 04/30/2011  . Cardiomyopathy 05/02/2011    EF 45-50%, per Echo.  . 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 Carotid artery stenosis 05/02/2011    Past Surgical History  Procedure Date  . Cholecystectomy   . Skin cancer excision   . Tonsillectomy     No family history on file.  History  Substance Use Topics  . Smoking status: Current Everyday Smoker -- 1.0 packs/day    Types: Cigarettes  . Smokeless tobacco: Current User  . Alcohol Use: No    OB History    Grav Para Term Preterm Abortions TAB SAB Ect Mult Living                  Review of Systems  Constitutional: Negative for fever.       10 Systems reviewed and are negative for acute change except as noted in the HPI.  HENT: Negative for congestion.   Eyes: Negative for discharge and redness.  Respiratory: Negative for cough and shortness of breath.   Cardiovascular: Negative for chest pain.    Gastrointestinal: Positive for abdominal pain. Negative for vomiting.  Musculoskeletal: Negative for back pain.  Skin: Negative for rash.  Neurological: Negative for syncope, numbness and headaches.  Psychiatric/Behavioral:       No behavior change.    Allergies  Contrast media; Iodine; Nitrofurantoin; Nitrofurantoin monohyd macro; Penicillins; Sulfa antibiotics; and Sulfonamide derivatives  Home Medications   Current Outpatient Rx  Name Route Sig Dispense Refill  . ATORVASTATIN CALCIUM 10 MG PO TABS Oral Take 10 mg by mouth at bedtime.    Marland Kitchen BENAZEPRIL HCL 20 MG PO TABS Oral Take 20 mg by mouth every morning.     Marland Kitchen CALCIUM PO Oral Take 1 tablet by mouth daily.    . CYCLOSPORINE 0.05 % OP EMUL Both Eyes Place 1 drop into both eyes 2 (two) times daily.    Marland Kitchen ESTAZOLAM 2 MG PO TABS Oral Take 2 mg by mouth at bedtime.    Marland Kitchen MEMANTINE HCL 10 MG PO TABS Oral Take 10 mg by mouth 2 (two) times daily.    Marland Kitchen METOPROLOL TARTRATE 25 MG PO TABS Oral Take 25 mg by mouth every morning.    Marland Kitchen NIACIN PO Oral Take 1 capsule by mouth daily.    . OXYBUTYNIN CHLORIDE ER 10 MG PO TB24 Oral Take 10 mg by mouth every morning.     Marland Kitchen  PRAZOSIN HCL 2 MG PO CAPS Oral Take 2 mg by mouth every evening.     . WARFARIN SODIUM 3 MG PO TABS Oral Take 3 mg by mouth as directed. On Saturdays and Sundays only. Take 4mg  on Monday through Friday    . WARFARIN SODIUM 4 MG PO TABS Oral Take 4 mg by mouth daily. *take 4mg  on Mondays through Fridays, take 3mg  on Saturday and Sunday*      BP 111/41  Pulse 45  Temp 98.5 F (36.9 C)  Resp 20  Ht 5\' 3"  (1.6 m)  Wt 120 lb (54.432 kg)  BMI 21.26 kg/m2  SpO2 100%  Physical Exam  Nursing note and vitals reviewed. Constitutional: She is oriented to person, place, and time. She appears well-developed and well-nourished.       Awake, alert, nontoxic appearance.  HENT:  Head: Atraumatic.  Eyes: Right eye exhibits no discharge. Left eye exhibits no discharge.  Neck: Neck supple.   Cardiovascular: Normal heart sounds.   Pulmonary/Chest: Effort normal and breath sounds normal. She exhibits no tenderness.  Abdominal: Soft. Bowel sounds are normal. There is no tenderness. There is no rebound and no guarding.  Musculoskeletal: She exhibits no tenderness.       Baseline ROM, no obvious new focal weakness.  Neurological: She is alert and oriented to person, place, and time.       Mental status and motor strength appears baseline for patient and situation.  Skin: No rash noted.  Psychiatric: She has a normal mood and affect.    ED Course  Procedures (including critical care time)  Results for orders placed during the hospital encounter of 10/02/11  CBC WITH DIFFERENTIAL      Component Value Range   WBC 7.1  4.0 - 10.5 K/uL   RBC 4.73  3.87 - 5.11 MIL/uL   Hemoglobin 14.7  12.0 - 15.0 g/dL   HCT 16.1  09.6 - 04.5 %   MCV 90.7  78.0 - 100.0 fL   MCH 31.1  26.0 - 34.0 pg   MCHC 34.3  30.0 - 36.0 g/dL   RDW 40.9  81.1 - 91.4 %   Platelets 165  150 - 400 K/uL   Neutrophils Relative 44  43 - 77 %   Neutro Abs 3.1  1.7 - 7.7 K/uL   Lymphocytes Relative 47 (*) 12 - 46 %   Lymphs Abs 3.4  0.7 - 4.0 K/uL   Monocytes Relative 6  3 - 12 %   Monocytes Absolute 0.4  0.1 - 1.0 K/uL   Eosinophils Relative 3  0 - 5 %   Eosinophils Absolute 0.2  0.0 - 0.7 K/uL   Basophils Relative 0  0 - 1 %   Basophils Absolute 0.0  0.0 - 0.1 K/uL  CBC WITH DIFFERENTIAL      Component Value Range   WBC 11.3 (*) 4.0 - 10.5 K/uL   RBC 4.62  3.87 - 5.11 MIL/uL   Hemoglobin 14.6  12.0 - 15.0 g/dL   HCT 78.2  95.6 - 21.3 %   MCV 90.9  78.0 - 100.0 fL   MCH 31.6  26.0 - 34.0 pg   MCHC 34.8  30.0 - 36.0 g/dL   RDW 08.6  57.8 - 46.9 %   Platelets 164  150 - 400 K/uL   Neutrophils Relative 74  43 - 77 %   Neutro Abs 8.4 (*) 1.7 - 7.7 K/uL   Lymphocytes Relative 19  12 -  46 %   Lymphs Abs 2.1  0.7 - 4.0 K/uL   Monocytes Relative 5  3 - 12 %   Monocytes Absolute 0.6  0.1 - 1.0 K/uL    Eosinophils Relative 2  0 - 5 %   Eosinophils Absolute 0.2  0.0 - 0.7 K/uL   Basophils Relative 0  0 - 1 %   Basophils Absolute 0.0  0.0 - 0.1 K/uL  BASIC METABOLIC PANEL      Component Value Range   Sodium 137  135 - 145 mEq/L   Potassium 3.3 (*) 3.5 - 5.1 mEq/L   Chloride 105  96 - 112 mEq/L   CO2 24  19 - 32 mEq/L   Glucose, Bld 113 (*) 70 - 99 mg/dL   BUN 10  6 - 23 mg/dL   Creatinine, Ser 1.61  0.50 - 1.10 mg/dL   Calcium 9.5  8.4 - 09.6 mg/dL   GFR calc non Af Amer 87 (*) >90 mL/min   GFR calc Af Amer >90  >90 mL/min  PROTIME-INR      Component Value Range   Prothrombin Time 20.1 (*) 11.6 - 15.2 seconds   INR 1.68 (*) 0.00 - 1.49  APTT      Component Value Range   aPTT 38 (*) 24 - 37 seconds    Date: 10/02/2011  2310  Rate: 53  Rhythm: atrial fibrillation  QRS Axis: normal  Intervals: atrial fib  ST/T Wave abnormalities: normal  Conduction Disutrbances:none and left anterior fascicular block  Narrative Interpretation:   Old EKG Reviewed: changes noted c/w 04/30/11 LAFB is new    MDM  Patient with episode of syncope vs falling asleep on the toilet. EKG with new LAFB. Potassium slightly low. EKG with atrial fibrillation which is her baseline. INR slightly low.  Patient awake and alert. Ambulated in the department. No chest pain or symptoms. Pt stable in ED with no significant deterioration in condition.The patient appears reasonably screened and/or stabilized for discharge and I doubt any other medical condition or other [facility] requiring further screening, evaluation, or treatment in the ED at this time prior to discharge.  MDM Reviewed: previous chart, nursing note and vitals Reviewed previous: labs and ECG Interpretation: labs and ECG          Nicoletta Dress. Colon Branch, MD 10/03/11 [phone]

## 2011-12-11 DIAGNOSIS — I672 Cerebral atherosclerosis: Secondary | ICD-10-CM

## 2011-12-11 HISTORY — DX: Cerebral atherosclerosis: I67.2

## 2011-12-23 ENCOUNTER — Emergency Department (HOSPITAL_COMMUNITY): Payer: Medicare Other

## 2011-12-23 ENCOUNTER — Emergency Department (HOSPITAL_COMMUNITY)
Admission: EM | Admit: 2011-12-23 | Discharge: 2011-12-23 | Disposition: A | Discharge status: Home / Self Care | Payer: Medicare Other | Attending: Emergency Medicine | Admitting: Emergency Medicine

## 2011-12-23 ENCOUNTER — Encounter (HOSPITAL_COMMUNITY): Payer: Self-pay | Admitting: Emergency Medicine

## 2011-12-23 DIAGNOSIS — Z91041 Radiographic dye allergy status: Secondary | ICD-10-CM | POA: Insufficient documentation

## 2011-12-23 DIAGNOSIS — F17200 Nicotine dependence, unspecified, uncomplicated: Secondary | ICD-10-CM | POA: Insufficient documentation

## 2011-12-23 DIAGNOSIS — I6529 Occlusion and stenosis of unspecified carotid artery: Secondary | ICD-10-CM | POA: Insufficient documentation

## 2011-12-23 DIAGNOSIS — Z882 Allergy status to sulfonamides status: Secondary | ICD-10-CM | POA: Insufficient documentation

## 2011-12-23 DIAGNOSIS — K219 Gastro-esophageal reflux disease without esophagitis: Secondary | ICD-10-CM | POA: Insufficient documentation

## 2011-12-23 DIAGNOSIS — Z8673 Personal history of transient ischemic attack (TIA), and cerebral infarction without residual deficits: Secondary | ICD-10-CM | POA: Insufficient documentation

## 2011-12-23 DIAGNOSIS — Y929 Unspecified place or not applicable: Secondary | ICD-10-CM | POA: Insufficient documentation

## 2011-12-23 DIAGNOSIS — Y9389 Activity, other specified: Secondary | ICD-10-CM | POA: Insufficient documentation

## 2011-12-23 DIAGNOSIS — Z7901 Long term (current) use of anticoagulants: Secondary | ICD-10-CM | POA: Insufficient documentation

## 2011-12-23 DIAGNOSIS — Z880 Allergy status to penicillin: Secondary | ICD-10-CM | POA: Insufficient documentation

## 2011-12-23 DIAGNOSIS — I10 Essential (primary) hypertension: Secondary | ICD-10-CM | POA: Insufficient documentation

## 2011-12-23 DIAGNOSIS — S93609A Unspecified sprain of unspecified foot, initial encounter: Secondary | ICD-10-CM

## 2011-12-23 DIAGNOSIS — Z881 Allergy status to other antibiotic agents status: Secondary | ICD-10-CM | POA: Insufficient documentation

## 2011-12-23 DIAGNOSIS — I4891 Unspecified atrial fibrillation: Secondary | ICD-10-CM | POA: Insufficient documentation

## 2011-12-23 DIAGNOSIS — I43 Cardiomyopathy in diseases classified elsewhere: Secondary | ICD-10-CM | POA: Insufficient documentation

## 2011-12-23 DIAGNOSIS — Z79899 Other long term (current) drug therapy: Secondary | ICD-10-CM | POA: Insufficient documentation

## 2011-12-23 DIAGNOSIS — W1789XA Other fall from one level to another, initial encounter: Secondary | ICD-10-CM | POA: Insufficient documentation

## 2011-12-23 DIAGNOSIS — Z888 Allergy status to other drugs, medicaments and biological substances status: Secondary | ICD-10-CM | POA: Insufficient documentation

## 2011-12-23 NOTE — ED Provider Notes (Signed)
History   This chart was scribed for [employer]. Rubin Payor, MD by Toya Smothers. The patient was seen in room APA07/APA07. Patient's care was started at 1855.  CSN: [id number]  Arrival date & time 12/23/11  4098   First MD Initiated Contact with Patient 12/23/11 2030      Chief Complaint  Patient presents with  . Ankle Pain   The history is provided by the patient. No language interpreter was used.    Andrea [name] is a 76 y.o. female who presents to the Emergency Department complaining of 2 hours of new, sudden onset, left ankle pain with associated swelling after a fall. Pain is unchanged, constant, and moderate, aggravated with palpation and ROM, while alleviated by nothing. Pt reports falling while getting out of the car, twisting her left ankle and landing on a hard surface. Symptoms have not been treated PTA. No LOC, cephalic injury, cough, congestion, rhinorrhea, chest pain, SOB, or n/v/d. Pertinent medical Hx includes neuropathy in both legs.   Past Medical History  Diagnosis Date  . Hypertension   . Stroke   . Acid reflux   . Atrial fibrillation   . Cancer     facial  . Tobacco abuse 04/30/2011  . Cardiomyopathy 05/02/2011    EF 45-50%, per Echo.  . 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 Carotid artery stenosis 05/02/2011    Past Surgical History  Procedure Date  . Cholecystectomy   . Skin cancer excision   . Tonsillectomy     History reviewed. No pertinent family history.  History  Substance Use Topics  . Smoking status: Current Every Day Smoker -- 1.0 packs/day    Types: Cigarettes  . Smokeless tobacco: Current User  . Alcohol Use: No   Review of Systems  Constitutional: Negative for fatigue.  HENT: Negative for congestion, sinus pressure and ear discharge.   Eyes: Negative for discharge.  Respiratory: Negative for cough.   Cardiovascular: Negative for chest pain.  Gastrointestinal: Negative for abdominal pain and diarrhea.  Genitourinary:  Negative for frequency and hematuria.  Musculoskeletal: Negative for back pain.  Skin: Negative for rash.  Neurological: Negative for seizures and headaches.  Hematological: Negative.   Psychiatric/Behavioral: Negative for hallucinations.  All other systems reviewed and are negative.    Allergies  Contrast media; Iodine; Nitrofurantoin; Nitrofurantoin monohyd macro; Penicillins; Sulfa antibiotics; and Sulfonamide derivatives  Home Medications   Current Outpatient Rx  Name Route Sig Dispense Refill  . ATORVASTATIN CALCIUM 10 MG PO TABS Oral Take 10 mg by mouth at bedtime.    Marland Kitchen BENAZEPRIL HCL 20 MG PO TABS Oral Take 20 mg by mouth every morning.     Marland Kitchen CALCIUM PO Oral Take 1 tablet by mouth daily.    . CYCLOSPORINE 0.05 % OP EMUL Both Eyes Place 1 drop into both eyes 2 (two) times daily.    Marland Kitchen ESTAZOLAM 2 MG PO TABS Oral Take 2 mg by mouth at bedtime.    Marland Kitchen MEMANTINE HCL 10 MG PO TABS Oral Take 10 mg by mouth 2 (two) times daily.    Marland Kitchen METOPROLOL TARTRATE 25 MG PO TABS Oral Take 25 mg by mouth every morning.    Marland Kitchen NIACIN PO Oral Take 1 capsule by mouth daily.    . OXYBUTYNIN CHLORIDE ER 10 MG PO TB24 Oral Take 10 mg by mouth every morning.     Marland Kitchen PRAZOSIN HCL 2 MG PO CAPS Oral Take 2 mg by mouth every evening.     Marland Kitchen  WARFARIN SODIUM 3 MG PO TABS Oral Take 3 mg by mouth as directed. On Saturdays and Sundays only. Take 4mg  on Monday through Friday    . WARFARIN SODIUM 4 MG PO TABS Oral Take 4 mg by mouth daily. *take 4mg  on Mondays through Fridays, take 3mg  on Saturday and Sunday*      BP 155/89  Pulse 58  Temp 98.7 F (37.1 C) (Oral)  Resp 18  SpO2 99%  Physical Exam  Constitutional: She is oriented to person, place, and time. No distress.       Appears well   HENT:  Head: Normocephalic and atraumatic.  Mouth/Throat: No oropharyngeal exudate.  Cardiovascular: Normal rate, regular rhythm and intact distal pulses.   No murmur heard. Pulmonary/Chest: Effort normal and breath sounds  normal. No respiratory distress. She has no wheezes.  Abdominal: Soft. Bowel sounds are normal. She exhibits no mass. There is no tenderness.  Musculoskeletal:       Tenderness over right anterior ankle. No tenderness over medial proximal fibula. Distally neurovascularly intact.  Neurological: She is alert and oriented to person, place, and time. Coordination normal.  Skin: Skin is warm and dry. No rash noted. She is not diaphoretic.    ED Course  Procedures COORDINATION OF CARE: 20:31- Evaluated Pt. Pt is awake, alert, and without distress. 20:33- Patient informed of clinical course, understand medical decision-making process, and agree with plan. 20:48- Ordered DG Foot Complete Right 1 time imaging. 21:47- Rechecked Pt. No tenderness over the fifth metatarsal. Pt feels mildly improved after observation and/or treatment in ED. Will prepare for discharge.  Results for orders placed during the hospital encounter of 10/02/11  CBC WITH DIFFERENTIAL      Component Value Range   WBC 7.1  4.0 - 10.5 K/uL   RBC 4.73  3.87 - 5.11 MIL/uL   Hemoglobin 14.7  12.0 - 15.0 g/dL   HCT 16.1  09.6 - 04.5 %   MCV 90.7  78.0 - 100.0 fL   MCH 31.1  26.0 - 34.0 pg   MCHC 34.3  30.0 - 36.0 g/dL   RDW 40.9  81.1 - 91.4 %   Platelets 165  150 - 400 K/uL   Neutrophils Relative 44  43 - 77 %   Neutro Abs 3.1  1.7 - 7.7 K/uL   Lymphocytes Relative 47 (*) 12 - 46 %   Lymphs Abs 3.4  0.7 - 4.0 K/uL   Monocytes Relative 6  3 - 12 %   Monocytes Absolute 0.4  0.1 - 1.0 K/uL   Eosinophils Relative 3  0 - 5 %   Eosinophils Absolute 0.2  0.0 - 0.7 K/uL   Basophils Relative 0  0 - 1 %   Basophils Absolute 0.0  0.0 - 0.1 K/uL  CBC WITH DIFFERENTIAL      Component Value Range   WBC 11.3 (*) 4.0 - 10.5 K/uL   RBC 4.62  3.87 - 5.11 MIL/uL   Hemoglobin 14.6  12.0 - 15.0 g/dL   HCT 78.2  95.6 - 21.3 %   MCV 90.9  78.0 - 100.0 fL   MCH 31.6  26.0 - 34.0 pg   MCHC 34.8  30.0 - 36.0 g/dL   RDW 08.6  57.8 - 46.9 %     Platelets 164  150 - 400 K/uL   Neutrophils Relative 74  43 - 77 %   Neutro Abs 8.4 (*) 1.7 - 7.7 K/uL   Lymphocytes Relative 19  12 -  46 %   Lymphs Abs 2.1  0.7 - 4.0 K/uL   Monocytes Relative 5  3 - 12 %   Monocytes Absolute 0.6  0.1 - 1.0 K/uL   Eosinophils Relative 2  0 - 5 %   Eosinophils Absolute 0.2  0.0 - 0.7 K/uL   Basophils Relative 0  0 - 1 %   Basophils Absolute 0.0  0.0 - 0.1 K/uL  BASIC METABOLIC PANEL      Component Value Range   Sodium 137  135 - 145 mEq/L   Potassium 3.3 (*) 3.5 - 5.1 mEq/L   Chloride 105  96 - 112 mEq/L   CO2 24  19 - 32 mEq/L   Glucose, Bld 113 (*) 70 - 99 mg/dL   BUN 10  6 - 23 mg/dL   Creatinine, Ser 1.61  0.50 - 1.10 mg/dL   Calcium 9.5  8.4 - 09.6 mg/dL   GFR calc non Af Amer 87 (*) >90 mL/min   GFR calc Af Amer >90  >90 mL/min  PROTIME-INR      Component Value Range   Prothrombin Time 20.1 (*) 11.6 - 15.2 seconds   INR 1.68 (*) 0.00 - 1.49  APTT      Component Value Range   aPTT 38 (*) 24 - 37 seconds   Dg Foot Complete Left  12/23/2011  *RADIOLOGY REPORT*  Clinical Data: See left foot pain, fall.  LEFT FOOT - COMPLETE 3+ VIEW  Comparison: 02/18/2010  Findings: Subtle lucency noted at the base of the left fifth metatarsal.  Cannot exclude small nondisplaced avulsed fragment. Recommend clinical correlation for pain in this area.  Diffuse osteopenia.  No additional acute bony abnormality.  IMPRESSION: Questionable nondisplaced fracture base of the left fifth metatarsal.  Recommend clinical correlation.   Original Report Authenticated By: Charlett Nose, M.D.       MDM  Patient with foot pain after fall. Tender over mid foot. No tenderness over base of fifth metatarsal. X-ray showed only questionable finding at base of fifth metatarsal. I think this is not a fracture. Patient be discharged to followup with her primary care doctor as needed      I personally performed the services described in this documentation, which was scribed in my  presence. The recorded information has been reviewed and considered.       Juliet Rude. Rubin Payor, MD 12/23/11 2202

## 2011-12-23 NOTE — ED Notes (Signed)
Patient reports falling tonight getting out of car. Complaining of swelling and pain to left ankle. States fell and buttocks, denies hitting head.

## 2012-03-08 ENCOUNTER — Encounter (HOSPITAL_COMMUNITY): Payer: Self-pay

## 2012-03-08 ENCOUNTER — Emergency Department (HOSPITAL_COMMUNITY)
Admission: EM | Admit: 2012-03-08 | Discharge: 2012-03-08 | Disposition: A | Discharge status: Home / Self Care | Payer: Medicare Other | Attending: Emergency Medicine | Admitting: Emergency Medicine

## 2012-03-08 DIAGNOSIS — Z8673 Personal history of transient ischemic attack (TIA), and cerebral infarction without residual deficits: Secondary | ICD-10-CM | POA: Insufficient documentation

## 2012-03-08 DIAGNOSIS — K219 Gastro-esophageal reflux disease without esophagitis: Secondary | ICD-10-CM | POA: Insufficient documentation

## 2012-03-08 DIAGNOSIS — Z8679 Personal history of other diseases of the circulatory system: Secondary | ICD-10-CM | POA: Insufficient documentation

## 2012-03-08 DIAGNOSIS — K6289 Other specified diseases of anus and rectum: Secondary | ICD-10-CM | POA: Insufficient documentation

## 2012-03-08 DIAGNOSIS — I10 Essential (primary) hypertension: Secondary | ICD-10-CM | POA: Insufficient documentation

## 2012-03-08 DIAGNOSIS — F17200 Nicotine dependence, unspecified, uncomplicated: Secondary | ICD-10-CM | POA: Insufficient documentation

## 2012-03-08 DIAGNOSIS — Z7901 Long term (current) use of anticoagulants: Secondary | ICD-10-CM | POA: Insufficient documentation

## 2012-03-08 DIAGNOSIS — K5641 Fecal impaction: Secondary | ICD-10-CM | POA: Insufficient documentation

## 2012-03-08 DIAGNOSIS — Z85828 Personal history of other malignant neoplasm of skin: Secondary | ICD-10-CM | POA: Insufficient documentation

## 2012-03-08 DIAGNOSIS — K5900 Constipation, unspecified: Secondary | ICD-10-CM

## 2012-03-08 DIAGNOSIS — Z79899 Other long term (current) drug therapy: Secondary | ICD-10-CM | POA: Insufficient documentation

## 2012-03-08 MED ORDER — DOCUSATE SODIUM 100 MG PO CAPS: 100.0000 mg | ORAL_CAPSULE | Freq: Two times a day (BID) | ORAL | Status: DC

## 2012-03-08 NOTE — ED Notes (Signed)
Can not have a bowel movement. Having pain in rectum per family. Took 2 suppositories and some milk of magnesia tonight per family. Has been having a small amount of stool tonight, not solid, just soft and not formed per family.

## 2012-03-08 NOTE — ED Provider Notes (Signed)
History     CSN: [id number]  Arrival date & time 03/08/12  4098   First MD Initiated Contact with Patient 03/08/12 [phone]      Chief Complaint  Patient presents with  . Constipation  . Rectal Pain    (Consider location/radiation/quality/duration/timing/severity/associated sxs/prior treatment) HPI Comments: Elderly 77 year old female who presents with a complaint of rectal pain and constipation. According to the patient she has had rare small hard stools and has had a gradual onset of gradually worsening, constant, moderate to severe rectal pain. This has occurred over the last several days. She has taken suppositories as well as milk of magnesia prior to arrival and has not had any bowel movements. On arrival the patient felt as though she passed small amount of liquid stool.  Patient is a 77 y.o. female presenting with constipation. The history is provided by the patient and a relative.  Constipation  Associated symptoms include rectal pain. Pertinent negatives include no fever and no abdominal pain.    Past Medical History  Diagnosis Date  . Hypertension   . Stroke   . Acid reflux   . Atrial fibrillation   . Cancer     facial  . Tobacco abuse 04/30/2011  . Cardiomyopathy 05/02/2011    EF 45-50%, per Echo.  . 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 Carotid artery stenosis 05/02/2011    Past Surgical History  Procedure Date  . Cholecystectomy   . Skin cancer excision   . Tonsillectomy     No family history on file.  History  Substance Use Topics  . Smoking status: Current Every Day Smoker -- 1.0 packs/day    Types: Cigarettes  . Smokeless tobacco: Current User  . Alcohol Use: No    OB History    Grav Para Term Preterm Abortions TAB SAB Ect Mult Living                  Review of Systems  Constitutional: Negative for fever.  Gastrointestinal: Positive for constipation and rectal pain. Negative for abdominal pain.    Allergies  Contrast media; Iodine;  Nitrofurantoin; Nitrofurantoin monohyd macro; Penicillins; Sulfa antibiotics; and Sulfonamide derivatives  Home Medications   Current Outpatient Rx  Name  Route  Sig  Dispense  Refill  . ATORVASTATIN CALCIUM 10 MG PO TABS   Oral   Take 10 mg by mouth at bedtime.         Marland Kitchen BENAZEPRIL HCL 20 MG PO TABS   Oral   Take 20 mg by mouth every morning.          Marland Kitchen CALCIUM CARBONATE-VITAMIN D 500-125 MG-UNIT PO TABS   Oral   Take 1 tablet by mouth daily.         . CYCLOSPORINE 0.05 % OP EMUL   Both Eyes   Place 1 drop into both eyes 2 (two) times daily.         Marland Kitchen ESTAZOLAM 2 MG PO TABS   Oral   Take 2 mg by mouth at bedtime.         Marland Kitchen MEMANTINE HCL 10 MG PO TABS   Oral   Take 10 mg by mouth 2 (two) times daily.         Marland Kitchen METOPROLOL TARTRATE 25 MG PO TABS   Oral   Take 25 mg by mouth every morning.         Marland Kitchen NIACIN 500 MG PO TABS   Oral   Take  500 mg by mouth daily.         . OXYBUTYNIN CHLORIDE ER 10 MG PO TB24   Oral   Take 10 mg by mouth every morning.          Marland Kitchen PRAZOSIN HCL 2 MG PO CAPS   Oral   Take 2 mg by mouth every evening.          . WARFARIN SODIUM 3 MG PO TABS   Oral   Take 3 mg by mouth as directed. On Saturdays and Sundays only. Take 4mg  on Monday through Friday         . WARFARIN SODIUM 4 MG PO TABS   Oral   Take 4 mg by mouth daily. *take 4mg  on Mondays through Fridays, take 3mg  on Saturday and Sunday*         . DOCUSATE SODIUM 100 MG PO CAPS   Oral   Take 1 capsule (100 mg total) by mouth every 12 (twelve) hours.   30 capsule   0     BP 126/77  Pulse 89  Temp 98.1 F (36.7 C) (Oral)  Resp 22  Ht 5\' 3"  (1.6 m)  Wt 120 lb (54.432 kg)  BMI 21.26 kg/m2  SpO2 98%  Physical Exam  Nursing note and vitals reviewed. Constitutional:       No acute distress, not diaphoretic, well-developed  HENT:       Mucous membranes are moist, normocephalic, atraumatic  Eyes: Conjunctivae normal are normal. Right eye exhibits no  discharge. Left eye exhibits no discharge.  Cardiovascular: Normal rate, normal heart sounds and intact distal pulses.  An irregularly irregular rhythm present.  Pulmonary/Chest:       Lungs are clear, no wheezing rhonchi or rales, no increased work of breathing.  Abdominal:       Chaperone present for entire rectal exam, patient has no fissures were significant hemorrhoids, has a large rectal impaction, stool ball is hard. Small amount of liquid stool surrounding stool ball  Musculoskeletal:       No peripheral edema  Neurological:       Slurred speech,normal level of alertness and orientation  Skin:       No rashes    ED Course  Fecal disimpaction Date/Time: 03/08/2012 4:00 AM Performed by: Eber Hong D Authorized by: Eber Hong D Consent: Verbal consent obtained. Risks and benefits: risks, benefits and alternatives were discussed Consent given by: patient Local anesthesia used: no Patient sedated: no Patient tolerance: Patient tolerated the procedure well with no immediate complications. Comments: Large amount of brown stool removed from the rectal vault, patient felt much improved at the end of the procedure, stable for discharge on laxatives or stool softener   (including critical care time)  Labs Reviewed - No data to display No results found.   1. Fecal impaction in rectum   2. Constipation       MDM  Fecal disimpaction performed by myself, large amount of brown stool removed, no bleeding, small amount of soft stool left in the rectal vault at the end of the procedure.        Vida Roller, MD 03/08/12 [phone]

## 2012-06-04 ENCOUNTER — Other Ambulatory Visit (HOSPITAL_COMMUNITY): Payer: Self-pay | Admitting: Internal Medicine

## 2012-06-04 DIAGNOSIS — Z139 Encounter for screening, unspecified: Secondary | ICD-10-CM

## 2012-06-24 ENCOUNTER — Ambulatory Visit (HOSPITAL_COMMUNITY)
Admission: RE | Admit: 2012-06-24 | Discharge: 2012-06-24 | Disposition: A | Discharge status: Home / Self Care | Payer: Medicare Other | Source: Ambulatory Visit | Attending: Internal Medicine | Admitting: Internal Medicine

## 2012-06-24 DIAGNOSIS — Z139 Encounter for screening, unspecified: Secondary | ICD-10-CM

## 2012-06-24 DIAGNOSIS — Z1231 Encounter for screening mammogram for malignant neoplasm of breast: Secondary | ICD-10-CM | POA: Insufficient documentation

## 2012-07-05 ENCOUNTER — Encounter: Payer: Self-pay | Admitting: Pharmacist Clinician (PhC)/ Clinical Pharmacy Specialist

## 2012-07-08 ENCOUNTER — Ambulatory Visit (INDEPENDENT_AMBULATORY_CARE_PROVIDER_SITE_OTHER): Payer: Medicare Other | Admitting: Cardiovascular Disease

## 2012-07-08 ENCOUNTER — Encounter: Payer: Self-pay | Admitting: Cardiovascular Disease

## 2012-07-08 VITALS — BP 104/62 | HR 52 | Ht 62.0 in | Wt 112.0 lb

## 2012-07-08 DIAGNOSIS — F17200 Nicotine dependence, unspecified, uncomplicated: Secondary | ICD-10-CM

## 2012-07-08 DIAGNOSIS — E785 Hyperlipidemia, unspecified: Secondary | ICD-10-CM | POA: Insufficient documentation

## 2012-07-08 DIAGNOSIS — I4891 Unspecified atrial fibrillation: Secondary | ICD-10-CM

## 2012-07-08 DIAGNOSIS — R079 Chest pain, unspecified: Secondary | ICD-10-CM | POA: Insufficient documentation

## 2012-07-08 DIAGNOSIS — Z720 Tobacco use: Secondary | ICD-10-CM

## 2012-07-08 NOTE — Progress Notes (Signed)
07/08/2012 Andrea [name]   1935/08/04  [id number]  Primary Physician Cassell Smiles., MD Primary Cardiologist: Runell Gess MD [name]   HPI:  The patient is a 77 year old, thin and frail appearing, married, Caucasian female, mother of four, who I last saw in the office six months ago. She has a history of continued tobacco abuse of one and a half packs per day, recalcitrant to risk factor modification. She has chronic A fib, rate controlled on Coumadin anticoagulation, followed by [facility]. She has had a failed TEE cardioversion in the past. Other problems include: Hypertension and hyperlipidemia. She did have a stroke in June 2012 and another stroke apparently in March 2013 in the left MCA distribution leaving her with dysarthria and right-sided paresis.   An echo done during her hospitalization revealed an EF of 45 to 50% with severe left atrial dilatation.   She was seen in the office by Nada Boozer registered nurse practitioner 06/11/12 which is pain. 2 she was begun on oral nitrate it is related to increased frequency of chest pain. A stress test was suggested but the patient declined when she was seen last. She did have a Persantine Myoview performed in 2008 showed mild apical defect.     Current Outpatient Prescriptions  Medication Sig Dispense Refill  . atorvastatin (LIPITOR) 10 MG tablet Take 10 mg by mouth at bedtime.      . benazepril (LOTENSIN) 20 MG tablet Take 20 mg by mouth every morning.       . Calcium Carbonate-Vitamin D (CALCIUM 500 + D) 500-125 MG-UNIT TABS Take 1 tablet by mouth daily.      Marland Kitchen docusate sodium (COLACE) 100 MG capsule Take 1 capsule (100 mg total) by mouth every 12 (twelve) hours.  30 capsule  0  . estazolam (PROSOM) 2 MG tablet Take 2 mg by mouth at bedtime.      . isosorbide mononitrate (IMDUR) 30 MG 24 hr tablet Take 30 mg by mouth daily.      . metoprolol tartrate (LOPRESSOR) 25 MG tablet Take 25 mg by mouth every morning.       Marland Kitchen MYRBETRIQ 25 MG TB24 Take 25 mg by mouth daily.      . niacin 500 MG tablet Take 500 mg by mouth daily.      Marland Kitchen NITROSTAT 0.4 MG SL tablet Place 0.4 mg under the tongue as needed.      Marland Kitchen oxybutynin (DITROPAN-XL) 10 MG 24 hr tablet Take 10 mg by mouth every morning.       . prazosin (MINIPRESS) 2 MG capsule Take 2 mg by mouth every evening.       . warfarin (COUMADIN) 3 MG tablet Take 3 mg by mouth as directed. On Saturdays and Sundays only. Take 4mg  on Monday through Friday      . warfarin (COUMADIN) 4 MG tablet Take 4 mg by mouth daily. *take 4mg  on Mondays through Fridays, take 3mg  on Saturday and Sunday*       No current facility-administered medications for this visit.    Allergies  Allergen Reactions  . Codeine   . Contrast Media (Iodinated Diagnostic Agents)     Per pt chart.  . Iodine   . Lovenox (Enoxaparin)   . Nitrofurantoin Other (See Comments)    Numbness in arms and legs  . Nitrofurantoin Monohyd Macro Other (See Comments)  . Penicillins Rash  . Sulfa Antibiotics Rash  . Sulfonamide Derivatives Rash    History   Social  History  . Marital Status: Married    Spouse Name: Andrea [name]    Number of Children: Andrea [name]  . Years of Education: Andrea [name]   Occupational History  . Not on file.   Social History Main Topics  . Smoking status: Current Every Day Smoker -- 1.00 packs/day    Types: Cigarettes  . Smokeless tobacco: Current User  . Alcohol Use: No  . Drug Use: No  . Sexually Active: Not Currently   Other Topics Concern  . Not on file   Social History Narrative  . No narrative on file     Review of Systems: General: negative for chills, fever, night sweats or weight changes.  Cardiovascular: negative for chest pain, dyspnea on exertion, edema, orthopnea, palpitations, paroxysmal nocturnal dyspnea or shortness of breath Dermatological: negative for rash Respiratory: negative for cough or wheezing Urologic: negative for hematuria Abdominal: negative for nausea,  vomiting, diarrhea, bright red blood per rectum, melena, or hematemesis Neurologic: negative for visual changes, syncope, or dizziness All other systems reviewed and are otherwise negative except as noted above.    Blood pressure 104/62, pulse 52, height 5\' 2"  (1.575 m), weight 112 lb (50.803 kg).  General appearance: alert and no distress Neck: no adenopathy, no carotid bruit, no JVD, supple, symmetrical, trachea midline and thyroid not enlarged, symmetric, no tenderness/mass/nodules Lungs: clear to auscultation bilaterally Heart: irregularly irregular rhythm Extremities: extremities normal, atraumatic, no cyanosis or edema  EKG performed today  ASSESSMENT AND PLAN:   Atrial fibrillation Stable on Coumadin anticoagulation.  Tobacco abuse Recalcitrant to risk factor modification. I did counsel her for greater than 30 minutes.  Chest pain The patient had him not Myoview stress test performed in 2008 that showed mild apical redistributing defect. She is begun on Imdur 30 mg a day and has noticed decreased frequency of chest pain though it still present. Based on this I suggested that she have a Lexiscan Myoview to compare to her prior study.  Hyperlipidemia On statin therapy. Her last lipid profile performed 06/13/12 revealed a glucose of 108, LDL 59 and HDL of 33      Runell Gess MD [facility] 07/08/2012 4:16 PM

## 2012-07-08 NOTE — Assessment & Plan Note (Signed)
On statin therapy. Her last lipid profile performed 06/13/12 revealed a glucose of 108, LDL 59 and HDL of 33

## 2012-07-08 NOTE — Assessment & Plan Note (Signed)
The patient had him not Myoview stress test performed in 2008 that showed mild apical redistributing defect. She is begun on Imdur 30 mg a day and has noticed decreased frequency of chest pain though it still present. Based on this I suggested that she have a Lexiscan Myoview to compare to her prior study.

## 2012-07-08 NOTE — Assessment & Plan Note (Signed)
Stable on Coumadin anticoagulation.

## 2012-07-08 NOTE — Assessment & Plan Note (Signed)
Recalcitrant to risk factor modification. I did counsel her for greater than 30 minutes.

## 2012-07-08 NOTE — Patient Instructions (Signed)
Your physician wants you to follow-up in: 12 MONTHS WITH DR Allyson Sabal AND 6 MONTHS WITH AN EXTENDER  You will receive a reminder letter in the mail two months in advance. If you don't receive a letter, please call our office to schedule the follow-up appointment.   Your physician has requested that you have an LEXISCAN myoview. For further information please visit [email or web address] Please follow instruction sheet, as given.

## 2012-07-17 ENCOUNTER — Ambulatory Visit (HOSPITAL_COMMUNITY)
Admission: RE | Admit: 2012-07-17 | Discharge: 2012-07-17 | Disposition: A | Discharge status: Home / Self Care | Payer: Medicare Other | Source: Ambulatory Visit | Attending: Cardiovascular Disease | Admitting: Cardiovascular Disease

## 2012-07-17 DIAGNOSIS — R9431 Abnormal electrocardiogram [ECG] [EKG]: Secondary | ICD-10-CM | POA: Insufficient documentation

## 2012-07-17 DIAGNOSIS — R079 Chest pain, unspecified: Secondary | ICD-10-CM

## 2012-07-17 DIAGNOSIS — I779 Disorder of arteries and arterioles, unspecified: Secondary | ICD-10-CM | POA: Insufficient documentation

## 2012-07-17 DIAGNOSIS — I739 Peripheral vascular disease, unspecified: Secondary | ICD-10-CM | POA: Insufficient documentation

## 2012-07-17 DIAGNOSIS — R0602 Shortness of breath: Secondary | ICD-10-CM | POA: Insufficient documentation

## 2012-07-17 DIAGNOSIS — I10 Essential (primary) hypertension: Secondary | ICD-10-CM | POA: Insufficient documentation

## 2012-07-17 DIAGNOSIS — I4891 Unspecified atrial fibrillation: Secondary | ICD-10-CM

## 2012-07-17 DIAGNOSIS — F17200 Nicotine dependence, unspecified, uncomplicated: Secondary | ICD-10-CM | POA: Insufficient documentation

## 2012-07-17 MED ORDER — TECHNETIUM TC 99M SESTAMIBI GENERIC - CARDIOLITE
30.8000 | Freq: Once | INTRAVENOUS | Status: AC | PRN
  Administered 2012-07-17: 30.8 via INTRAVENOUS

## 2012-07-17 MED ORDER — TECHNETIUM TC 99M SESTAMIBI GENERIC - CARDIOLITE
10.2000 | Freq: Once | INTRAVENOUS | Status: AC | PRN
  Administered 2012-07-17: 10 via INTRAVENOUS

## 2012-07-17 MED ORDER — REGADENOSON 0.4 MG/5ML IV SOLN
0.4000 mg | Freq: Once | INTRAVENOUS | Status: AC
  Administered 2012-07-17: 0.4 mg via INTRAVENOUS

## 2012-07-17 MED ORDER — AMINOPHYLLINE 25 MG/ML IV SOLN
75.0000 mg | Freq: Once | INTRAVENOUS | Status: AC
  Administered 2012-07-17: 75 mg via INTRAVENOUS

## 2012-07-17 NOTE — Procedures (Addendum)
[facility] [address] [place] [place] [address] [phone]  Cardiology Nuclear Med Study  Andrea [name] is a 77 y.o. female     MRN : [id number]     DOB: 07-05-1935  Procedure Date: 07/17/2012  Nuclear Med Background Indication for Stress Test:  Evaluation for Ischemia and Abnormal EKG History:  COPD, MVP and irreg.hr Cardiac Risk Factors: Carotid Disease, CVA, Hypertension, Lipids, PVD, Smoker and TIA  Symptoms:  Chest Pain and SOB   Nuclear Pre-Procedure Caffeine/Decaff Intake:  1:00am NPO After: 11 AM   IV Site: L Forearm  IV 0.9% NS with Angio Cath:  22g  Chest Size (in):  N/A  IV Started by: Emmit Pomfret, RN  Height: 5\' 2"  (1.575 m)  Cup Size: B  BMI:  Body mass index is 21.76 kg/(m^2). Weight:  119 lb (53.978 kg)   Tech Comments:  N/A    Nuclear Med Study 1 or 2 day study: 1 day  Stress Test Type:  Lexiscan  Order Authorizing Provider:  Nanetta Batty, MD   Resting Radionuclide: Technetium 57m Sestamibi  Resting Radionuclide Dose: 10.2 mCi   Stress Radionuclide:  Technetium 75m Sestamibi  Stress Radionuclide Dose: 30.8 mCi           Stress Protocol Rest HR: 52 Stress HR: 73  Rest BP: 145/79 Stress BP:171/88  Exercise Time (min): n/a METS: n/a          Dose of Adenosine (mg):  n/a Dose of Lexiscan: 0.4 mg  Dose of Atropine (mg): n/a Dose of Dobutamine: n/a mcg/kg/min (at max HR)  Stress Test Technologist: Ernestene Mention, CCT Nuclear Technologist: Gonzella Lex, CNMT   Rest Procedure:  Myocardial perfusion imaging was performed at rest 45 minutes following the intravenous administration of Technetium 31m Sestamibi. Stress Procedure:  The patient received IV Lexiscan 0.4 mg over 15-seconds.  Technetium 31m Sestamibi injected at 30-seconds.  Due to patient's shortness of breath, she was given IV Aminophylline 75 mg. Symptom was resolved during recovery. There were no significant changes with Lexiscan.   Quantitative spect images were obtained after a 45 minute delay.  Transient Ischemic Dilatation (Normal <1.22):  1.12 Lung/Heart Ratio (Normal <0.45):  0.31 QGS EDV:  76 ml QGS ESV:  33 ml LV Ejection Fraction: 56%  Signed by       Rest ECG: Atrial Fibrilliation  Stress ECG: No significant change from baseline ECG  QPS Raw Data Images:  Normal; no motion artifact; normal heart/lung ratio. Stress Images:  small apical defect Rest Images:  small apical defect Subtraction (SDS):  No evidence of ischemia.  Impression Exercise Capacity:  Lexiscan with no exercise. BP Response:  Normal blood pressure response. Clinical Symptoms:  No significant symptoms noted. ECG Impression:  No significant ST segment change suggestive of ischemia. Comparison with Prior Nuclear Study: No significant change from previous study  Overall Impression:  Low risk stress nuclear study with apical thinning.  LV Wall Motion:  NL LV Function; NL Wall Motion   Runell Gess, MD  07/17/2012 5:51 PM

## 2012-10-04 ENCOUNTER — Encounter (HOSPITAL_COMMUNITY): Payer: Self-pay | Admitting: Emergency Medicine

## 2012-10-04 ENCOUNTER — Observation Stay (HOSPITAL_COMMUNITY): Payer: Medicare Other

## 2012-10-04 ENCOUNTER — Emergency Department (HOSPITAL_COMMUNITY): Payer: Medicare Other

## 2012-10-04 ENCOUNTER — Observation Stay (HOSPITAL_COMMUNITY)
Admission: EM | Admit: 2012-10-04 | Discharge: 2012-10-05 | Disposition: A | Discharge status: Home / Self Care | Payer: Medicare Other | Attending: Internal Medicine | Admitting: Internal Medicine

## 2012-10-04 DIAGNOSIS — R55 Syncope and collapse: Principal | ICD-10-CM | POA: Diagnosis present

## 2012-10-04 DIAGNOSIS — Z720 Tobacco use: Secondary | ICD-10-CM | POA: Diagnosis present

## 2012-10-04 DIAGNOSIS — F17200 Nicotine dependence, unspecified, uncomplicated: Secondary | ICD-10-CM | POA: Insufficient documentation

## 2012-10-04 DIAGNOSIS — I059 Rheumatic mitral valve disease, unspecified: Secondary | ICD-10-CM

## 2012-10-04 DIAGNOSIS — I4891 Unspecified atrial fibrillation: Secondary | ICD-10-CM | POA: Insufficient documentation

## 2012-10-04 DIAGNOSIS — R079 Chest pain, unspecified: Secondary | ICD-10-CM | POA: Insufficient documentation

## 2012-10-04 HISTORY — DX: Polyneuropathy, unspecified: G62.9

## 2012-10-04 LAB — URINALYSIS, ROUTINE W REFLEX MICROSCOPIC
Glucose, UA: NEGATIVE mg/dL
Nitrite: POSITIVE — AB
Specific Gravity, Urine: 1.02 (ref 1.005–1.030)
pH: 6 (ref 5.0–8.0)

## 2012-10-04 LAB — CBC WITH DIFFERENTIAL/PLATELET
Eosinophils Relative: 2 % (ref 0–5)
HCT: 41.8 % (ref 36.0–46.0)
Hemoglobin: 14.3 g/dL (ref 12.0–15.0)
Lymphocytes Relative: 49 % — ABNORMAL HIGH (ref 12–46)
MCV: 93.3 fL (ref 78.0–100.0)
Monocytes Absolute: 0.4 10*3/uL (ref 0.1–1.0)
Monocytes Relative: 5 % (ref 3–12)
Neutro Abs: 3.3 10*3/uL (ref 1.7–7.7)
WBC: 7.7 10*3/uL (ref 4.0–10.5)

## 2012-10-04 LAB — BASIC METABOLIC PANEL
BUN: 23 mg/dL (ref 6–23)
CO2: 24 mEq/L (ref 19–32)
Calcium: 9.7 mg/dL (ref 8.4–10.5)
Chloride: 102 mEq/L (ref 96–112)
Creatinine, Ser: 0.75 mg/dL (ref 0.50–1.10)
Glucose, Bld: 118 mg/dL — ABNORMAL HIGH (ref 70–99)

## 2012-10-04 LAB — TSH: TSH: 0.863 u[IU]/mL (ref 0.350–4.500)

## 2012-10-04 LAB — URINE MICROSCOPIC-ADD ON

## 2012-10-04 LAB — TROPONIN I
Troponin I: <0.3 ng/mL (ref <0.30)
Troponin I: <0.3 ng/mL (ref <0.30)

## 2012-10-04 LAB — MRSA PCR SCREENING: MRSA by PCR: NEGATIVE

## 2012-10-04 MED ORDER — ONDANSETRON HCL 4 MG PO TABS: 4.0000 mg | ORAL_TABLET | Freq: Four times a day (QID) | ORAL | Status: DC | PRN

## 2012-10-04 MED ORDER — DOCUSATE SODIUM 100 MG PO CAPS
100.0000 mg | ORAL_CAPSULE | Freq: Two times a day (BID) | ORAL | Status: DC
  Administered 2012-10-05: 100 mg via ORAL
  Filled 2012-10-04 (×2): qty 1

## 2012-10-04 MED ORDER — MIRABEGRON ER 25 MG PO TB24
25.0000 mg | ORAL_TABLET | Freq: Every day | ORAL | Status: DC
  Administered 2012-10-04 – 2012-10-05 (×2): 25 mg via ORAL
  Filled 2012-10-04 (×4): qty 1

## 2012-10-04 MED ORDER — PANTOPRAZOLE SODIUM 40 MG PO TBEC
40.0000 mg | DELAYED_RELEASE_TABLET | Freq: Every day | ORAL | Status: DC
  Administered 2012-10-04 – 2012-10-05 (×2): 40 mg via ORAL
  Filled 2012-10-04 (×2): qty 1

## 2012-10-04 MED ORDER — ATORVASTATIN CALCIUM 10 MG PO TABS
10.0000 mg | ORAL_TABLET | Freq: Every day | ORAL | Status: DC
  Administered 2012-10-04: 10 mg via ORAL
  Filled 2012-10-04: qty 1

## 2012-10-04 MED ORDER — ACETAMINOPHEN 650 MG RE SUPP: 650.0000 mg | Freq: Four times a day (QID) | RECTAL | Status: DC | PRN

## 2012-10-04 MED ORDER — ACETAMINOPHEN 325 MG PO TABS
650.0000 mg | ORAL_TABLET | Freq: Four times a day (QID) | ORAL | Status: DC | PRN
  Administered 2012-10-04 – 2012-10-05 (×2): 650 mg via ORAL
  Filled 2012-10-04 (×2): qty 2

## 2012-10-04 MED ORDER — SODIUM CHLORIDE 0.9 % IV SOLN: INTRAVENOUS | Status: DC

## 2012-10-04 MED ORDER — OXYBUTYNIN CHLORIDE ER 5 MG PO TB24
10.0000 mg | ORAL_TABLET | ORAL | Status: DC
  Administered 2012-10-04 – 2012-10-05 (×2): 10 mg via ORAL
  Filled 2012-10-04 (×2): qty 2

## 2012-10-04 MED ORDER — ONDANSETRON HCL 4 MG/2ML IJ SOLN: 4.0000 mg | Freq: Four times a day (QID) | INTRAMUSCULAR | Status: DC | PRN

## 2012-10-04 MED ORDER — TEMAZEPAM 15 MG PO CAPS
15.0000 mg | ORAL_CAPSULE | Freq: Every day | ORAL | Status: DC
Start: 2012-10-04 — End: 2012-10-05
  Administered 2012-10-04: 15 mg via ORAL
  Filled 2012-10-04: qty 1

## 2012-10-04 MED ORDER — WARFARIN - PHARMACIST DOSING INPATIENT: Status: DC

## 2012-10-04 MED ORDER — SODIUM CHLORIDE 0.9 % IJ SOLN
3.0000 mL | Freq: Two times a day (BID) | INTRAMUSCULAR | Status: DC
  Administered 2012-10-04: 3 mL via INTRAVENOUS

## 2012-10-04 MED ORDER — ENSURE COMPLETE PO LIQD
237.0000 mL | Freq: Two times a day (BID) | ORAL | Status: DC
  Administered 2012-10-05: 237 mL via ORAL

## 2012-10-04 MED ORDER — ASPIRIN 81 MG PO CHEW
324.0000 mg | CHEWABLE_TABLET | Freq: Once | ORAL | Status: AC
  Administered 2012-10-04: 324 mg via ORAL
  Filled 2012-10-04: qty 4

## 2012-10-04 MED ORDER — METOPROLOL TARTRATE 25 MG PO TABS
12.5000 mg | ORAL_TABLET | Freq: Every day | ORAL | Status: DC
  Administered 2012-10-05: 12.5 mg via ORAL
  Filled 2012-10-04 (×2): qty 1

## 2012-10-04 MED ORDER — NICOTINE 14 MG/24HR TD PT24
14.0000 mg | MEDICATED_PATCH | Freq: Every day | TRANSDERMAL | Status: DC
  Administered 2012-10-04 – 2012-10-05 (×2): 14 mg via TRANSDERMAL
  Filled 2012-10-04 (×2): qty 1

## 2012-10-04 MED ORDER — BIOTENE DRY MOUTH MT LIQD
15.0000 mL | Freq: Two times a day (BID) | OROMUCOSAL | Status: DC
  Administered 2012-10-04 – 2012-10-05 (×3): 15 mL via OROMUCOSAL

## 2012-10-04 MED ORDER — WARFARIN SODIUM 2 MG PO TABS
4.0000 mg | ORAL_TABLET | Freq: Once | ORAL | Status: AC
  Administered 2012-10-04: 4 mg via ORAL
  Filled 2012-10-04: qty 2

## 2012-10-04 NOTE — Progress Notes (Signed)
ANTICOAGULATION CONSULT NOTE - Initial Consult  Pharmacy Consult for Coumadin (chronic PTA) Indication: atrial fibrillation  Allergies  Allergen Reactions  . Codeine   . Contrast Media [Iodinated Diagnostic Agents]     Per pt chart.  . Iodine   . Lovenox [Enoxaparin]   . Nitrofurantoin Other (See Comments)    Numbness in arms and legs  . Nitrofurantoin Monohyd Macro Other (See Comments)  . Penicillins Rash  . Sulfa Antibiotics Rash  . Sulfonamide Derivatives Rash   Patient Measurements: Height: 5\' 5"  (165.1 cm) Weight: 123 lb 7.3 oz (56 kg) IBW/kg (Calculated) : 57  Vital Signs: Temp: 98 F (36.7 C) (08/15 0730) Temp src: Oral (08/15 0730) BP: 118/83 mmHg (08/15 0641) Pulse Rate: 42 (08/15 0641)  Labs:  Recent Labs  10/04/12 0300 10/04/12 0652  HGB 14.3  --   HCT 41.8  --   PLT 185  --   LABPROT 25.8*  --   INR 2.45*  --   CREATININE 0.75  --   TROPONINI <0.30 <0.30   Estimated Creatinine Clearance: 53.7 ml/min (by C-G formula based on Cr of 0.75).  Medical History: Past Medical History  Diagnosis Date  . Hypertension   . Stroke   . Acid reflux   . Atrial fibrillation   . Cancer     facial  . Tobacco abuse 04/30/2011  . Cardiomyopathy 05/02/2011    EF 45-50%, per Echo.  . 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 Carotid artery stenosis 05/02/2011  . CAD (coronary artery disease) 07/20/2006    R/S MV - mild perfusion defect in apical region consistent w/ infarct/scar vs apical thinning; no scintigraphic evidence of inducible myocardial ischemia; baseline EKG demonstrates A Flutter w/ variable AV conduction  . Hyperlipidemia   . Chest pain   . Cerebral atherosclerosis 12/11/2011    Carotid duplex - R ICA 0-49% diameter reduction, velocities suggest high end; L ICA 0-49% reduction, velocities suggest mid range  . Neuropathy    Medications:  Prescriptions prior to admission  Medication Sig Dispense Refill  . atorvastatin (LIPITOR) 10 MG tablet  Take 10 mg by mouth at bedtime.      . benazepril (LOTENSIN) 20 MG tablet Take 20 mg by mouth every morning.       Marland Kitchen CALCIUM-VITAMIN D PO Take 1 tablet by mouth daily.      Marland Kitchen docusate sodium (COLACE) 100 MG capsule Take 1 capsule (100 mg total) by mouth every 12 (twelve) hours.  30 capsule  0  . esomeprazole (NEXIUM) 20 MG capsule Take 20 mg by mouth daily before breakfast.      . estazolam (PROSOM) 2 MG tablet Take 2 mg by mouth at bedtime.      . isosorbide mononitrate (IMDUR) 30 MG 24 hr tablet Take 30 mg by mouth daily.      . metoprolol tartrate (LOPRESSOR) 25 MG tablet Take 25 mg by mouth every morning.      Marland Kitchen MYRBETRIQ 25 MG TB24 Take 25 mg by mouth daily.      . niacin 500 MG tablet Take 500 mg by mouth daily.      Marland Kitchen NITROSTAT 0.4 MG SL tablet Place 0.4 mg under the tongue as needed.      Marland Kitchen oxybutynin (DITROPAN-XL) 10 MG 24 hr tablet Take 10 mg by mouth every morning.       . prazosin (MINIPRESS) 2 MG capsule Take 2 mg by mouth every evening.       Marland Kitchen  triamcinolone cream (KENALOG) 0.1 % Apply topically 2 (two) times daily.      Marland Kitchen warfarin (COUMADIN) 3 MG tablet Take 3 mg by mouth as directed. On Saturdays and Sundays only. Take 4mg  on Monday through Friday      . warfarin (COUMADIN) 4 MG tablet Take 4 mg by mouth daily. *take 4mg  on Mondays through Fridays, take 3mg  on Saturday and Sunday*       Assessment: 77yo female on chronic Coumadin PTA for h/o afib.  INR is therapeutic on admission.  Home dose listed above.    Goal of Therapy:  INR 2-3 Monitor platelets by anticoagulation protocol: Yes   Plan:  Coumadin 4mg  po today x 1 (home dose) INR daily  Margo Aye, Kanetra Ho A 10/04/2012,10:39 AM

## 2012-10-04 NOTE — Care Management Note (Signed)
    Page 1 of 1   10/04/2012     1:06:23 PM   CARE MANAGEMENT NOTE 10/04/2012  Patient:  Andrea [name], Andrea [name]   Account Number:  [id number]  Date Initiated:  10/04/2012  Documentation initiated by:  Sharrie Rothman  Subjective/Objective Assessment:   Pt admitted from home with CP. Pt lives with her husband and will return home at discharge. Pt has walker, w/c, and BSC. Pt does not do any walking at home per pts husband.     Action/Plan:   Pts husband denies pt has any HH needs.   Anticipated DC Date:  10/05/2012   Anticipated DC Plan:  HOME/SELF CARE      DC Planning Services  CM consult      Choice offered to / List presented to:             Status of service:  Completed, signed off Medicare Important Message given?   (If response is "NO", the following Medicare IM given date fields will be blank) Date Medicare IM given:   Date Additional Medicare IM given:    Discharge Disposition:  HOME/SELF CARE  Per UR Regulation:    If discussed at Long Length of Stay Meetings, dates discussed:    Comments:  10/04/12 1305 Arlyss Queen, RN BSN CM

## 2012-10-04 NOTE — ED Notes (Signed)
Per patient's husband, patient took Prosom sleeping pill at midnight. Reports patient had syncopal episode and passed out at approximately 0100 after taking a sublingual nitro. Patient complained of sudden onset of chest pain. Reports chest pain still at this time to left chest radiating into back.

## 2012-10-04 NOTE — ED Notes (Signed)
Dr. Glick in room assessing patient at this time.  

## 2012-10-04 NOTE — ED Notes (Signed)
Patient now denies any pain at this time, but reports she feels nauseous.

## 2012-10-04 NOTE — Progress Notes (Addendum)
Nutrition Brief Note  Patient identified on the Malnutrition Screening Tool (MST) Report  Wt Readings from Last 15 Encounters:  10/04/12 123 lb 7.3 oz (56 kg)  07/17/12 119 lb (53.978 kg)  07/08/12 112 lb (50.803 kg)  03/08/12 120 lb (54.432 kg)  10/02/11 120 lb (54.432 kg)  04/30/11 124 lb 8 oz (56.473 kg)  02/23/10 122 lb (55.339 kg)    Body mass index is 20.54 kg/(m^2). Patient meets criteria for normal based on current BMI.   Current diet order is Heart Healthy, patient is consuming approximately n/a% of meals at this time. Labs and medications reviewed. Will add Ensure Complete BID to support pt usual daily nutrition intake.    Royann Shivers MS,RD,LDN,CSG Office: [phone] Pager: [phone]

## 2012-10-04 NOTE — Progress Notes (Signed)
UR chart review completed.  

## 2012-10-04 NOTE — Evaluation (Signed)
Physical Therapy Evaluation Patient Details Name: Andrea [name] MRN: [id number] DOB: [birth year] Today's Date: 10/04/2012 Time: 4098-1191 PT Time Calculation (min): 56 min  PT Assessment / Plan / Recommendation History of Present Illness   Pt is admitted due to chest pain and syncopal episode at home.  She has had a stroke with dysarthria and has bilateral peripheral neuropathy which causes her to be minimally ambulatory.    Clinical Impression   Pt is seen for evaluation and found to be essentially at prior functional level.  She should not have any PT needs at d/c.    PT Assessment  Patent does not need any further PT services    Follow Up Recommendations  No PT follow up    Does the patient have the potential to tolerate intense rehabilitation    no  Barriers to Discharge        Equipment Recommendations  None recommended by PT    Recommendations for Other Services     Frequency      Precautions / Restrictions Precautions Precautions: Fall Restrictions Weight Bearing Restrictions: No   Pertinent Vitals/Pain       Mobility  Bed Mobility Bed Mobility: Left Sidelying to Sit;Sit to Supine;Supine to Sit Supine to Sit: 4: Min assist;HOB elevated Transfers Transfers: Sit to Stand;Stand to Sit;Stand Pivot Transfers Sit to Stand: 5: Supervision;From bed Stand to Sit: 5: Supervision;To chair/3-in-1 Stand Pivot Transfers: 5: Supervision Ambulation/Gait Ambulation/Gait Assistance: 5: Supervision Ambulation Distance (Feet): 8 Feet Assistive device: Standard walker Gait Pattern: Decreased hip/knee flexion - right;Decreased hip/knee flexion - left;Decreased dorsiflexion - left Gait velocity: slow and labored Stairs: No Wheelchair Mobility Wheelchair Mobility: No    Exercises     PT Diagnosis:    PT Problem List:   PT Treatment Interventions:       PT Goals(Current goals can be found in the care plan section) Acute Rehab PT Goals PT Goal Formulation: No  goals set, d/c therapy  Visit Information  Last PT Received On: 10/04/12       Prior Functioning  Home Living Family/patient expects to be discharged to:: Private residence Living Arrangements: Spouse/significant other Available Help at Discharge: Family;Available 24 hours/day Type of Home: House Home Access: Ramped entrance Home Layout: One level Home Equipment: Walker - 2 wheels;Cane - single point;Bedside commode;Wheelchair - manual Prior Function Level of Independence: Independent with assistive device(s) Communication Communication: Expressive difficulties (due to prior stroke)    Cognition  Cognition Arousal/Alertness: Awake/alert Behavior During Therapy: WFL for tasks assessed/performed Overall Cognitive Status: Within Functional Limits for tasks assessed    Extremity/Trunk Assessment Lower Extremity Assessment Lower Extremity Assessment: Generalized weakness (periperal neuropathy bilaterally with foot drop) Cervical / Trunk Assessment Cervical / Trunk Assessment: Kyphotic   Balance    End of Session PT - End of Session Equipment Utilized During Treatment: Gait belt Activity Tolerance: Patient tolerated treatment well Patient left: in chair;with call bell/phone within reach Nurse Communication: Mobility status  GP Functional Assessment Tool Used: clinical judgement Functional Limitation: Mobility: Walking and moving around Mobility: Walking and Moving Around Current Status ([id number]): At least 1 percent but less than 20 percent impaired, limited or restricted Mobility: Walking and Moving Around Goal Status [phone]): At least 1 percent but less than 20 percent impaired, limited or restricted Mobility: Walking and Moving Around Discharge Status [phone]): At least 1 percent but less than 20 percent impaired, limited or restricted   Andrea [name] 10/04/2012, 1:51 PM

## 2012-10-04 NOTE — H&P (Signed)
[facility] History and Physical  Andrea [name] ZOX:[id number] DOB: 1935/10/09 DOA: 10/04/2012   PCP: Cassell Smiles., MD  Specialists: Dr. Allyson Sabal is her cardiologist  Chief Complaint: Chest pain, and passing out spell  HPI: Andrea [name] is a 77 y.o. female with a past medical history of atrial fibrillation, hypertension, previous history of stroke, who was in her usual state of health about 1 AM this morning when she was sitting and watching TV with her husband when she started having chest pain in the retrosternal area. It was more than a pressure like sensation. She had eaten an enchilada at about 10:30 PM last night. She took one dose of nitroglycerin and this was followed by a syncopal episode. According to the husband she was unconscious for about 10 minutes. And, then she woke up and was still feeling a little lightheaded and continued to have chest pain and then she took another nitroglycerin and then the pain resolved. The lightheadedness went away after some more time. Denies any headaches or visual disturbances. Denies any focal weakness. She was unable to quantify the chest pain. She does get pain from time to time, and has taken nitroglycerin in the past. Her husband also thinks that sometimes acid reflux causes her to have chest pain. She denied any shortness of breath. No nausea, vomiting. No palpitations. Denies any changes to her medications recently. She took all her home medications as prescribed yesterday. She said she was diagnosed with a urinary tract infection the first week of August and was give an injection of Rocephin by PCP. However, was not prescribed any further antibiotics to be taken at home. She does admit to some painful urination recently. She does have some dysarthria and speech impairment due to her stroke and so history is somewhat limited.  Home Medications: Prior to Admission medications   Medication Sig Start Date End Date Taking? Authorizing  Provider  atorvastatin (LIPITOR) 10 MG tablet Take 10 mg by mouth at bedtime.   Yes Historical Provider, MD  benazepril (LOTENSIN) 20 MG tablet Take 20 mg by mouth every morning.    Yes Historical Provider, MD  Calcium Carbonate-Vitamin D (CALCIUM 500 + D) 500-125 MG-UNIT TABS Take 1 tablet by mouth daily.   Yes Historical Provider, MD  docusate sodium (COLACE) 100 MG capsule Take 1 capsule (100 mg total) by mouth every 12 (twelve) hours. 03/08/12  Yes Vida Roller, MD  esomeprazole (NEXIUM) 20 MG capsule Take 20 mg by mouth daily before breakfast.   Yes Historical Provider, MD  estazolam (PROSOM) 2 MG tablet Take 2 mg by mouth at bedtime.   Yes Historical Provider, MD  isosorbide mononitrate (IMDUR) 30 MG 24 hr tablet Take 30 mg by mouth daily. 07/05/12  Yes Historical Provider, MD  metoprolol tartrate (LOPRESSOR) 25 MG tablet Take 25 mg by mouth every morning.   Yes Historical Provider, MD  MYRBETRIQ 25 MG TB24 Take 25 mg by mouth daily. 06/27/12  Yes Historical Provider, MD  niacin 500 MG tablet Take 500 mg by mouth daily.   Yes Historical Provider, MD  NITROSTAT 0.4 MG SL tablet Place 0.4 mg under the tongue as needed. 06/12/12  Yes Historical Provider, MD  oxybutynin (DITROPAN-XL) 10 MG 24 hr tablet Take 10 mg by mouth every morning.    Yes Historical Provider, MD  prazosin (MINIPRESS) 2 MG capsule Take 2 mg by mouth every evening.    Yes Historical Provider, MD  triamcinolone cream (KENALOG) 0.1 % Apply topically  2 (two) times daily.   Yes Historical Provider, MD  warfarin (COUMADIN) 3 MG tablet Take 3 mg by mouth as directed. On Saturdays and Sundays only. Take 4mg  on Monday through Friday   Yes Historical Provider, MD  warfarin (COUMADIN) 4 MG tablet Take 4 mg by mouth daily. *take 4mg  on Mondays through Fridays, take 3mg  on Saturday and Sunday* 05/02/11  Yes Elliot Cousin, MD    Allergies:  Allergies  Allergen Reactions  . Codeine   . Contrast Media [Iodinated Diagnostic Agents]     Per  pt chart.  . Iodine   . Lovenox [Enoxaparin]   . Nitrofurantoin Other (See Comments)    Numbness in arms and legs  . Nitrofurantoin Monohyd Macro Other (See Comments)  . Penicillins Rash  . Sulfa Antibiotics Rash  . Sulfonamide Derivatives Rash    Past Medical History: Past Medical History  Diagnosis Date  . Hypertension   . Stroke   . Acid reflux   . Atrial fibrillation   . Cancer     facial  . Tobacco abuse 04/30/2011  . Cardiomyopathy 05/02/2011    EF 45-50%, per Echo.  . 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 Carotid artery stenosis 05/02/2011  . CAD (coronary artery disease) 07/20/2006    R/S MV - mild perfusion defect in apical region consistent w/ infarct/scar vs apical thinning; no scintigraphic evidence of inducible myocardial ischemia; baseline EKG demonstrates A Flutter w/ variable AV conduction  . Hyperlipidemia   . Chest pain   . Cerebral atherosclerosis 12/11/2011    Carotid duplex - R ICA 0-49% diameter reduction, velocities suggest high end; L ICA 0-49% reduction, velocities suggest mid range  . Neuropathy     Past Surgical History  Procedure Laterality Date  . Cholecystectomy    . Skin cancer excision    . Tonsillectomy    . Tee with cardioversion      failed in past but dates not given    Social History:  reports that she has been smoking Cigarettes.  She has been smoking about 1.00 pack per day. She uses smokeless tobacco. She reports that she does not drink alcohol or use illicit drugs.  Living Situation: Lives with her husband Activity Level: Uses a wheelchair to get around. At home she uses a walker.   Family History:  Family History  Problem Relation Age of Onset  . Adopted: Yes     Review of Systems - Unable due to speech difficulties/previous stroke  Physical Examination  Filed Vitals:   10/04/12 0245 10/04/12 0251 10/04/12 0300 10/04/12 0330  BP:  100/51 104/40 119/49  Pulse: 52 58 51 51  Temp:      TempSrc:      Resp:  14     Height:      Weight:      SpO2: 94% 100% 100% 99%    General appearance: alert, cooperative, appears stated age and no distress Head: Normocephalic, without obvious abnormality, atraumatic Eyes: conjunctivae/corneas clear. PERRL, EOM's intact.  Throat: lips, mucosa, and tongue normal; teeth and gums normal Resp: clear to auscultation bilaterally Cardio: S1-S2 is irregularly irregular. No S3, S4. No rubs, murmurs, or bruit. She has pectus excavatum. GI: soft, non-tender; bowel sounds normal; no masses,  no organomegaly Extremities: extremities normal, atraumatic, no cyanosis or edema Pulses: 2+ and symmetric Skin: Skin color, texture, turgor normal. No rashes or lesions Lymph nodes: Cervical, supraclavicular, and axillary nodes normal. Neurologic: She is alert. Very slow  to respond to questions due to her previous stroke. No new focal deficits are noted.  Laboratory Data: Results for orders placed during the hospital encounter of 10/04/12 (from the past 48 hour(s))  CBC WITH DIFFERENTIAL     Status: Abnormal   Collection Time    10/04/12  3:00 AM      Result Value Range   WBC 7.7  4.0 - 10.5 K/uL   RBC 4.48  3.87 - 5.11 MIL/uL   Hemoglobin 14.3  12.0 - 15.0 g/dL   HCT 09.8  11.9 - 14.7 %   MCV 93.3  78.0 - 100.0 fL   MCH 31.9  26.0 - 34.0 pg   MCHC 34.2  30.0 - 36.0 g/dL   RDW 82.9  56.2 - 13.0 %   Platelets 185  150 - 400 K/uL   Neutrophils Relative % 44  43 - 77 %   Neutro Abs 3.3  1.7 - 7.7 K/uL   Lymphocytes Relative 49 (*) 12 - 46 %   Lymphs Abs 3.8  0.7 - 4.0 K/uL   Monocytes Relative 5  3 - 12 %   Monocytes Absolute 0.4  0.1 - 1.0 K/uL   Eosinophils Relative 2  0 - 5 %   Eosinophils Absolute 0.2  0.0 - 0.7 K/uL   Basophils Relative 0  0 - 1 %   Basophils Absolute 0.0  0.0 - 0.1 K/uL  BASIC METABOLIC PANEL     Status: Abnormal   Collection Time    10/04/12  3:00 AM      Result Value Range   Sodium 138  135 - 145 mEq/L   Potassium 3.8  3.5 - 5.1 mEq/L    Chloride 102  96 - 112 mEq/L   CO2 24  19 - 32 mEq/L   Glucose, Bld 118 (*) 70 - 99 mg/dL   BUN 23  6 - 23 mg/dL   Creatinine, Ser 8.65  0.50 - 1.10 mg/dL   Calcium 9.7  8.4 - 78.4 mg/dL   GFR calc non Af Amer 81 (*) >90 mL/min   GFR calc Af Amer >90  >90 mL/min   Comment: (NOTE)     The eGFR has been calculated using the CKD EPI equation.     This calculation has not been validated in all clinical situations.     eGFR's persistently <90 mL/min signify possible Chronic Kidney     Disease.  TROPONIN I     Status: None   Collection Time    10/04/12  3:00 AM      Result Value Range   Troponin I <0.30  <0.30 ng/mL   Comment:            Due to the release kinetics of cTnI,     a negative result within the first hours     of the onset of symptoms does not rule out     myocardial infarction with certainty.     If myocardial infarction is still suspected,     repeat the test at appropriate intervals.  PROTIME-INR     Status: Abnormal   Collection Time    10/04/12  3:00 AM      Result Value Range   Prothrombin Time 25.8 (*) 11.6 - 15.2 seconds   INR 2.45 (*) 0.00 - 1.49    Radiology Reports: Dg Chest Port 1 View  10/04/2012   *RADIOLOGY REPORT*  Clinical Data: Chest pain.  PORTABLE CHEST - 1 VIEW  Comparison: Chest x-ray 05/02/2011.  Findings: Chronic elevation of the left hemidiaphragm redemonstrated. A small amount of scarring and/or subsegmental atelectasis is noted in the lung bases bilaterally.  No definite consolidative airspace disease.  No pleural effusions.  No evidence of pulmonary edema.  Heart size appears mildly enlarged. The patient is rotated to the left on today's exam, resulting in distortion of the mediastinal contours and reduced diagnostic sensitivity and specificity for mediastinal pathology. Atherosclerosis in the thoracic aorta.  IMPRESSION: 1.  No radiographic evidence of acute cardiopulmonary disease.  The appearance of the chest is very similar to prior studies, as  above.   Original Report Authenticated By: Trudie Reed, M.D.    Electrocardiogram: EKG shows atrial fibrillation at 63 beats per minute. Left axis deviation is noted. Q waves in lead V1 and V2. No concerning ST changes. Nonspecific T-wave changes are noted. Is similar to previous EKGs.  Problem List  Principal Problem:   Syncope Active Problems:   Atrial fibrillation   Tobacco abuse   Chest pain   Assessment: This is a 77 year old, Caucasian female, who presents with chest pain, and syncopal episode. Differential diagnosis for the chest pain include angina, GERD, musculoskeletal. She reports that she had a stress test done in July, which was unremarkable per her cardiologist. Syncope was probably related to use of nitroglycerin. She is therapeutic with anticoagulation and so VTE is less likely. She is noted to be bradycardic at times.  Plan: #1 chest pain: Troponin is normal. We will continue to trend her troponin. Echocardiogram will be obtained. She is currently chest pain-free. Continue to monitor her on telemetry.  #2 Syncope: Probably related to the use of nitroglycerin. We will check orthostatics. We will monitor her on telemetry. Get echocardiogram and carotid Dopplers. Due to her bradycardia will check TSH level. We will hold beta blocker for now. Because she is on warfarin we'll get a CT head.  #3 history of atrial fibrillation with slow ventricular response: Continue to monitor on telemetry. Hold her rate control medications for now. Continue with warfarin.  #4 tobacco abuse: Nicotine patch will be prescribed.  #5 dysuria: UA will be checked.  DVT Prophylaxis: She is on warfarin Code Status: Full code Family Communication: Discussed with the patient and her husband  Disposition Plan: Observe on telemetry   Further management decisions will depend on results of further testing and patient's response to treatment.  [facility]  [facility] Pager  [phone]  If 7PM-7AM, please contact night-coverage [email or web address] Password [facility]  10/04/2012, 4:57 AM

## 2012-10-04 NOTE — ED Provider Notes (Signed)
CSN: [id number]     Arrival date & time 10/04/12  0155 History     First MD Initiated Contact with Patient 10/04/12 0248     Chief Complaint  Patient presents with  . Chest Pain  . Loss of Consciousness   (Consider location/radiation/quality/duration/timing/severity/associated sxs/prior Treatment) Patient is a 77 y.o. female presenting with chest pain and syncope. The history is provided by the patient and the spouse.  Chest Pain Associated symptoms: syncope   Loss of Consciousness Associated symptoms: chest pain   She was watching television when she started having some chest pain. She took nitroglycerin and following that she passed out. Husband states that she was unconscious for about 10 minutes. When she woke up, she was still having chest pain and took another nitroglycerin which did relieve her pain. She has difficulty describing the pain but states the was moderate and she rates it at 6/10. There is no associated dyspnea, nausea, diaphoresis. She is on warfarin for atrial fibrillation. She has had syncopal episodes in the past. Her husband states that she did have a stress test about 2 months ago which was unremarkable. Her chest pain resolved at about 1:30 AM.   Past Medical History  Diagnosis Date  . Hypertension   . Stroke   . Acid reflux   . Atrial fibrillation   . Cancer     facial  . Tobacco abuse 04/30/2011  . Cardiomyopathy 05/02/2011    EF 45-50%, per Echo.  . 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 Carotid artery stenosis 05/02/2011  . CAD (coronary artery disease) 07/20/2006    R/S MV - mild perfusion defect in apical region consistent w/ infarct/scar vs apical thinning; no scintigraphic evidence of inducible myocardial ischemia; baseline EKG demonstrates A Flutter w/ variable AV conduction  . Hyperlipidemia   . Chest pain   . Cerebral atherosclerosis 12/11/2011    Carotid duplex - R ICA 0-49% diameter reduction, velocities suggest high end; L ICA 0-49%  reduction, velocities suggest mid range  . Neuropathy    Past Surgical History  Procedure Laterality Date  . Cholecystectomy    . Skin cancer excision    . Tonsillectomy    . Tee with cardioversion      failed in past but dates not given   History reviewed. No pertinent family history. History  Substance Use Topics  . Smoking status: Current Every Day Smoker -- 1.00 packs/day    Types: Cigarettes  . Smokeless tobacco: Current User  . Alcohol Use: No   OB History   Grav Para Term Preterm Abortions TAB SAB Ect Mult Living                 Review of Systems  Cardiovascular: Positive for chest pain and syncope.  All other systems reviewed and are negative.    Allergies  Codeine; Contrast media; Iodine; Lovenox; Nitrofurantoin; Nitrofurantoin monohyd macro; Penicillins; Sulfa antibiotics; and Sulfonamide derivatives  Home Medications   Current Outpatient Rx  Name  Route  Sig  Dispense  Refill  . atorvastatin (LIPITOR) 10 MG tablet   Oral   Take 10 mg by mouth at bedtime.         . benazepril (LOTENSIN) 20 MG tablet   Oral   Take 20 mg by mouth every morning.          . Calcium Carbonate-Vitamin D (CALCIUM 500 + D) 500-125 MG-UNIT TABS   Oral   Take 1 tablet by mouth daily.         Marland Kitchen  docusate sodium (COLACE) 100 MG capsule   Oral   Take 1 capsule (100 mg total) by mouth every 12 (twelve) hours.   30 capsule   0   . esomeprazole (NEXIUM) 20 MG capsule   Oral   Take 20 mg by mouth daily before breakfast.         . estazolam (PROSOM) 2 MG tablet   Oral   Take 2 mg by mouth at bedtime.         . isosorbide mononitrate (IMDUR) 30 MG 24 hr tablet   Oral   Take 30 mg by mouth daily.         . metoprolol tartrate (LOPRESSOR) 25 MG tablet   Oral   Take 25 mg by mouth every morning.         Marland Kitchen MYRBETRIQ 25 MG TB24   Oral   Take 25 mg by mouth daily.         . niacin 500 MG tablet   Oral   Take 500 mg by mouth daily.         Marland Kitchen NITROSTAT  0.4 MG SL tablet   Sublingual   Place 0.4 mg under the tongue as needed.         Marland Kitchen oxybutynin (DITROPAN-XL) 10 MG 24 hr tablet   Oral   Take 10 mg by mouth every morning.          . prazosin (MINIPRESS) 2 MG capsule   Oral   Take 2 mg by mouth every evening.          . triamcinolone cream (KENALOG) 0.1 %   Topical   Apply topically 2 (two) times daily.         Marland Kitchen warfarin (COUMADIN) 3 MG tablet   Oral   Take 3 mg by mouth as directed. On Saturdays and Sundays only. Take 4mg  on Monday through Friday         . warfarin (COUMADIN) 4 MG tablet   Oral   Take 4 mg by mouth daily. *take 4mg  on Mondays through Fridays, take 3mg  on Saturday and Sunday*          BP 100/51  Pulse 58  Temp(Src) 97.2 F (36.2 C) (Oral)  Resp 14  Ht 5\' 2"  (1.575 m)  Wt 116 lb (52.617 kg)  BMI 21.21 kg/m2  SpO2 100% Physical Exam  Nursing note and vitals reviewed.  77 year old female, resting comfortably and in no acute distress. Vital signs are significant for bradycardia with heart rate 58. Oxygen saturation is 100%, which is normal. Head is normocephalic and atraumatic. PERRLA, EOMI. Oropharynx is clear. Neck is nontender and supple without adenopathy or JVD. There no carotid bruits. Back is nontender and there is no CVA tenderness. Lungs are clear without rales, wheezes, or rhonchi. Chest is nontender. Pectus excavatum is present. Heart is bradycardic and irregular without murmur. Abdomen is soft, flat, nontender without masses or hepatosplenomegaly and peristalsis is normoactive. Extremities have no cyanosis or edema, full range of motion is present. Skin is warm and dry without rash. Neurologic: Mental status is normal, cranial nerves are intact, there are no motor or sensory deficits.  ED Course   Procedures (including critical care time)  Results for orders placed during the hospital encounter of 10/04/12  CBC WITH DIFFERENTIAL      Result Value Range   WBC 7.7  4.0 - 10.5  K/uL   RBC 4.48  3.87 - 5.11 MIL/uL   Hemoglobin  14.3  12.0 - 15.0 g/dL   HCT 95.6  21.3 - 08.6 %   MCV 93.3  78.0 - 100.0 fL   MCH 31.9  26.0 - 34.0 pg   MCHC 34.2  30.0 - 36.0 g/dL   RDW 57.8  46.9 - 62.9 %   Platelets 185  150 - 400 K/uL   Neutrophils Relative % 44  43 - 77 %   Neutro Abs 3.3  1.7 - 7.7 K/uL   Lymphocytes Relative 49 (*) 12 - 46 %   Lymphs Abs 3.8  0.7 - 4.0 K/uL   Monocytes Relative 5  3 - 12 %   Monocytes Absolute 0.4  0.1 - 1.0 K/uL   Eosinophils Relative 2  0 - 5 %   Eosinophils Absolute 0.2  0.0 - 0.7 K/uL   Basophils Relative 0  0 - 1 %   Basophils Absolute 0.0  0.0 - 0.1 K/uL  BASIC METABOLIC PANEL      Result Value Range   Sodium 138  135 - 145 mEq/L   Potassium 3.8  3.5 - 5.1 mEq/L   Chloride 102  96 - 112 mEq/L   CO2 24  19 - 32 mEq/L   Glucose, Bld 118 (*) 70 - 99 mg/dL   BUN 23  6 - 23 mg/dL   Creatinine, Ser 5.28  0.50 - 1.10 mg/dL   Calcium 9.7  8.4 - 41.3 mg/dL   GFR calc non Af Amer 81 (*) >90 mL/min   GFR calc Af Amer >90  >90 mL/min  TROPONIN I      Result Value Range   Troponin I <0.30  <0.30 ng/mL  PROTIME-INR      Result Value Range   Prothrombin Time 25.8 (*) 11.6 - 15.2 seconds   INR 2.45 (*) 0.00 - 1.49   Dg Chest Port 1 View  10/04/2012   *RADIOLOGY REPORT*  Clinical Data: Chest pain.  PORTABLE CHEST - 1 VIEW  Comparison: Chest x-ray 05/02/2011.  Findings: Chronic elevation of the left hemidiaphragm redemonstrated. A small amount of scarring and/or subsegmental atelectasis is noted in the lung bases bilaterally.  No definite consolidative airspace disease.  No pleural effusions.  No evidence of pulmonary edema.  Heart size appears mildly enlarged. The patient is rotated to the left on today's exam, resulting in distortion of the mediastinal contours and reduced diagnostic sensitivity and specificity for mediastinal pathology. Atherosclerosis in the thoracic aorta.  IMPRESSION: 1.  No radiographic evidence of acute cardiopulmonary  disease.  The appearance of the chest is very similar to prior studies, as above.   Original Report Authenticated By: Trudie Reed, M.D.      Date: 10/04/2012  Rate: 63  Rhythm: atrial fibrillation  QRS Axis: left  Intervals: normal  ST/T Wave abnormalities: nonspecific T wave changes  Conduction Disutrbances:none  Narrative Interpretation: Atrial fibrillation, left axis deviation, old anteroseptal myocardial infarction, minor nonspecific T wave flattening. When compared with ECG of 10/02/2011, no significant changes are seen.  Old EKG Reviewed: unchanged   1. Syncope   2. Chest pain   3. Atrial fibrillation with slow ventricular response     MDM  Chest pain with syncope. It is possible that her syncope was related to hypotension from her nitroglycerin  Dose. Records are reviewed and she has a previous ED visit for syncope related to having a bowel movement. At the very least, she will need to be admitted for serial cardiac troponins.  She has remained  pain-free in the ED. Case is discussed with Dr. Rito Ehrlich of [facility] who agrees to admit the patient under observation status.  Dione Booze, MD 10/04/12 [phone]

## 2012-10-04 NOTE — Progress Notes (Addendum)
[facility] PROGRESS NOTE  Andrea [name] HYQ:[id number] DOB: [birth year] DOA: 10/04/2012 PCP: Cassell Smiles., MD  Brief narrative 77 year old female with history of tobacco abuse, chronic A. fib on Coumadin anticoagulation, failed TEE cardioversion in the past, hypertension, hyperlipidemia, CVA June 2012 & 2013 with residual dysarthria and right-sided hemiparesis, GERD, cardiomyopathy with EF 45-50%, CAD & ongoing intermittent chest pains was admitted on 10/04/12 with chest pain after eating enchilada and syncopal episode-possibly related to NTG. Lexiscan Myoview on 07/17/12 was normal.  Assessment/Plan: 1. Chest pain: DD: GERD, angina, musculoskeletal & others. Recent Myoview negative. Chest pain has resolved. Telemetry shows A. fib with controlled ventricular rate and occasional PVCs. Troponins x2 negative. Follow 2-D echo. 2. Syncope: Probably due to NTG related hypotension. Check orthostatic blood pressures, 2-D echo and carotid Dopplers. Telemetry without significant alarms. PT and OT evaluation. CT head negative. 3. UTI: IV Rocephin pending urine culture results. 4. Atrial fibrillation, with controlled ventricular rate. Beta blockers had been held on admission secondary to slow ventricular response but will resume at reduced dose to avoid rebound tachycardia. Coumadin per pharmacy. 5. Tobacco abuse: Patient recalcitrant to cessation advised. Continue nicotine patch. 6. Hypertension: Soft blood pressures. Resume low dose beta blockers. Hold nitrates and ACE inhibitors temporarily. 7. GERD: PPI.  Code Status: Full Family Communication: None Disposition Plan: Home when medically stable.   Consultants:  None  Procedures:  None  Antibiotics:  IV Rocephin 8/15 >   HPI/Subjective: Patient denies chest pain. Denies complaints.  Objective: Filed Vitals:   10/04/12 0639 10/04/12 0640 10/04/12 0641 10/04/12 0730  BP: 137/67 118/83 118/83   Pulse: 71 62 42   Temp:    98  F (36.7 C)  TempSrc:    Oral  Resp: 14 18 16    Height:      Weight:      SpO2: 99% 97% 96%     Intake/Output Summary (Last 24 hours) at 10/04/12 1010 Last data filed at 10/04/12 0522  Gross per 24 hour  Intake      0 ml  Output     20 ml  Net    -20 ml   Filed Weights   10/04/12 0213 10/04/12 0600  Weight: 52.617 kg (116 lb) 56 kg (123 lb 7.3 oz)    Exam:   General exam: Comfortable. Elderly frail female patient lying comfortably supine in bed  Respiratory system: Clear. No increased work of breathing.  Cardiovascular system: S1 & S2 heard, irregularly irregular. No JVD, murmurs, gallops, clicks or pedal edema. Telemetry: A. fib with controlled ventricular rate in the 50s to 60s and occasional PVCs.  Gastrointestinal system: Abdomen is nondistended, soft and nontender. Normal bowel sounds heard.  Central nervous system: Sleeping but easily aroused & oriented. No focal neurological deficits.  Extremities: Symmetric 5 x 5 power.  Neck: No carotid bruit   Data Reviewed: Basic Metabolic Panel:  Recent Labs Lab 10/04/12 0300  NA 138  K 3.8  CL 102  CO2 24  GLUCOSE 118*  BUN 23  CREATININE 0.75  CALCIUM 9.7   Liver Function Tests: No results found for this basename: AST, ALT, ALKPHOS, BILITOT, PROT, ALBUMIN,  in the last 168 hours No results found for this basename: LIPASE, AMYLASE,  in the last 168 hours No results found for this basename: AMMONIA,  in the last 168 hours CBC:  Recent Labs Lab 10/04/12 0300  WBC 7.7  NEUTROABS 3.3  HGB 14.3  HCT 41.8  MCV 93.3  PLT 185  Cardiac Enzymes:  Recent Labs Lab 10/04/12 0300 10/04/12 0652  TROPONINI <0.30 <0.30   BNP (last 3 results) No results found for this basename: PROBNP,  in the last 8760 hours CBG: No results found for this basename: GLUCAP,  in the last 168 hours  Recent Results (from the past 240 hour(s))  MRSA PCR SCREENING     Status: None   Collection Time    10/04/12  6:18 AM       Result Value Range Status   MRSA by PCR NEGATIVE  NEGATIVE Final   Comment:            The GeneXpert MRSA Assay (FDA     approved for NASAL specimens     only), is one component of a     comprehensive MRSA colonization     surveillance program. It is not     intended to diagnose MRSA     infection nor to guide or     monitor treatment for     MRSA infections.     Studies: Ct Head Wo Contrast  10/04/2012   *RADIOLOGY REPORT*  Clinical Data: Syncope.  CT HEAD WITHOUT CONTRAST  Technique:  Contiguous axial images were obtained from the base of the skull through the vertex without contrast.  Comparison: 04/29/2011.  Findings:  Skull:No acute osseous abnormality. No lytic or blastic lesion.  Orbits: No acute abnormality.  Brain: No evidence of acute abnormality, such as acute infarction, hemorrhage, hydrocephalus, or mass lesion/mass effect. Unchanged pattern of scattered cerebral white matter low attenuation, usually related to chronic small vessel ischemia.  IMPRESSION:  1.  No evidence of acute intracranial disease. 2.  Stable senescent changes including atrophy and chronic small vessel ischemia.   Original Report Authenticated By: Tiburcio Pea   Dg Chest Port 1 View  10/04/2012   *RADIOLOGY REPORT*  Clinical Data: Chest pain.  PORTABLE CHEST - 1 VIEW  Comparison: Chest x-ray 05/02/2011.  Findings: Chronic elevation of the left hemidiaphragm redemonstrated. A small amount of scarring and/or subsegmental atelectasis is noted in the lung bases bilaterally.  No definite consolidative airspace disease.  No pleural effusions.  No evidence of pulmonary edema.  Heart size appears mildly enlarged. The patient is rotated to the left on today's exam, resulting in distortion of the mediastinal contours and reduced diagnostic sensitivity and specificity for mediastinal pathology. Atherosclerosis in the thoracic aorta.  IMPRESSION: 1.  No radiographic evidence of acute cardiopulmonary disease.  The appearance  of the chest is very similar to prior studies, as above.   Original Report Authenticated By: Trudie Reed, M.D.     Additional labs:   Scheduled Meds: . antiseptic oral rinse  15 mL Mouth Rinse BID  . atorvastatin  10 mg Oral QHS  . docusate sodium  100 mg Oral Q12H  . mirabegron ER  25 mg Oral Daily  . nicotine  14 mg Transdermal Daily  . oxybutynin  10 mg Oral [id number]  . pantoprazole  40 mg Oral Daily  . sodium chloride  3 mL Intravenous Q12H  . temazepam  15 mg Oral QHS   Continuous Infusions: . sodium chloride      Principal Problem:   Syncope Active Problems:   Atrial fibrillation   Tobacco abuse   Chest pain    Time spent: 25 minutes.    [facility]  [facility] Pager [phone].   If 8PM-8AM, please contact night-coverage at [email or web address], password [facility] 10/04/2012, 10:10 AM  LOS: 0 days

## 2012-10-04 NOTE — Progress Notes (Signed)
*  PRELIMINARY RESULTS* Echocardiogram 2D Echocardiogram has been performed.  Conrad [place] 10/04/2012, 12:04 PM

## 2012-10-05 MED ORDER — METOPROLOL TARTRATE 12.5 MG HALF TABLET: 12.5000 mg | ORAL_TABLET | Freq: Every day | ORAL | Status: DC

## 2012-10-05 MED ORDER — CIPROFLOXACIN HCL 500 MG PO TABS: 500.0000 mg | ORAL_TABLET | Freq: Two times a day (BID) | ORAL | Status: AC

## 2012-10-05 MED ORDER — BENAZEPRIL HCL 10 MG PO TABS
20.0000 mg | ORAL_TABLET | ORAL | Status: DC
  Administered 2012-10-05: 20 mg via ORAL
  Filled 2012-10-05: qty 1

## 2012-10-05 MED ORDER — NICOTINE 14 MG/24HR TD PT24: 1.0000 | MEDICATED_PATCH | Freq: Every day | TRANSDERMAL | Status: DC

## 2012-10-05 MED ORDER — WARFARIN SODIUM 2 MG PO TABS: 3.0000 mg | ORAL_TABLET | Freq: Once | ORAL | Status: DC

## 2012-10-05 MED ORDER — ISOSORBIDE MONONITRATE ER 60 MG PO TB24
30.0000 mg | ORAL_TABLET | Freq: Every day | ORAL | Status: DC
  Administered 2012-10-05: 30 mg via ORAL
  Filled 2012-10-05: qty 1

## 2012-10-05 MED ORDER — OXYBUTYNIN CHLORIDE ER 5 MG PO TB24: 5.0000 mg | ORAL_TABLET | ORAL | Status: DC

## 2012-10-05 NOTE — Discharge Summary (Signed)
Physician Discharge Summary  Andrea [name] MRN: [id number] DOB/AGE: March 31, 1935 77 y.o.  PCP: Cassell Smiles., MD   Admit date: 10/04/2012 Discharge date: 10/05/2012  Discharge Diagnoses:      Syncope Active Problems:   Atrial fibrillation with slow ventricular response   Tobacco abuse   Chest pain     Medication List         atorvastatin 10 MG tablet  Commonly known as:  LIPITOR  Take 10 mg by mouth at bedtime.     benazepril 20 MG tablet  Commonly known as:  LOTENSIN  Take 20 mg by mouth every morning.     CALCIUM-VITAMIN D PO  Take 1 tablet by mouth daily.     docusate sodium 100 MG capsule  Commonly known as:  COLACE  Take 1 capsule (100 mg total) by mouth every 12 (twelve) hours.     esomeprazole 20 MG capsule  Commonly known as:  NEXIUM  Take 20 mg by mouth daily before breakfast.     estazolam 2 MG tablet  Commonly known as:  PROSOM  Take 2 mg by mouth at bedtime.     isosorbide mononitrate 30 MG 24 hr tablet  Commonly known as:  IMDUR  Take 30 mg by mouth daily.     metoprolol tartrate 12.5 mg Tabs tablet  Commonly known as:  LOPRESSOR  Take 0.5 tablets (12.5 mg total) by mouth daily.     MYRBETRIQ 25 MG Tb24 tablet  Generic drug:  mirabegron ER  Take 25 mg by mouth daily.     niacin 500 MG tablet  Take 500 mg by mouth daily.     NITROSTAT 0.4 MG SL tablet  Generic drug:  nitroGLYCERIN  Place 0.4 mg under the tongue as needed.     oxybutynin 5 MG 24 hr tablet  Commonly known as:  DITROPAN-XL  Take 1 tablet (5 mg total) by mouth every morning.     prazosin 2 MG capsule  Commonly known as:  MINIPRESS  Take 2 mg by mouth every evening.     triamcinolone cream 0.1 %  Commonly known as:  KENALOG  Apply topically 2 (two) times daily.     warfarin 3 MG tablet  Commonly known as:  COUMADIN  Take 3 mg by mouth as directed. On Saturdays and Sundays only. Take 4mg  on Monday through Friday     warfarin 4 MG tablet  Commonly known  as:  COUMADIN  Take 4 mg by mouth daily. *take 4mg  on Mondays through Fridays, take 3mg  on Saturday and Sunday*        Discharge Condition: Stable  Disposition: 01-Home or Self Care   Consults: None  Significant Diagnostic Studies: Ct Head Wo Contrast  10/04/2012   *RADIOLOGY REPORT*  Clinical Data: Syncope.  CT HEAD WITHOUT CONTRAST  Technique:  Contiguous axial images were obtained from the base of the skull through the vertex without contrast.  Comparison: 04/29/2011.  Findings:  Skull:No acute osseous abnormality. No lytic or blastic lesion.  Orbits: No acute abnormality.  Brain: No evidence of acute abnormality, such as acute infarction, hemorrhage, hydrocephalus, or mass lesion/mass effect. Unchanged pattern of scattered cerebral white matter low attenuation, usually related to chronic small vessel ischemia.  IMPRESSION:  1.  No evidence of acute intracranial disease. 2.  Stable senescent changes including atrophy and chronic small vessel ischemia.   Original Report Authenticated By: Tiburcio Pea   US Carotid Duplex Bilateral  10/04/2012   *RADIOLOGY  REPORT*  Clinical Data: Syncope.  BILATERAL CAROTID DUPLEX ULTRASOUND  Technique: Wallace Cullens scale imaging, color Doppler and duplex ultrasound were performed of bilateral carotid and vertebral arteries in the neck.  Comparison:  04/30/2011  Criteria:  Quantification of carotid stenosis is based on velocity parameters that correlate the residual internal carotid diameter with NASCET-based stenosis levels, using the diameter of the distal internal carotid lumen as the denominator for stenosis measurement.  The following velocity measurements were obtained:                   PEAK SYSTOLIC/END DIASTOLIC RIGHT ICA:                        141cm/sec CCA:                        81cm/sec SYSTOLIC ICA/CCA RATIO:     1.7 DIASTOLIC ICA/CCA RATIO:    2.5 ECA:                        161cm/sec  LEFT ICA:                        136cm/sec CCA:                         60cm/sec SYSTOLIC ICA/CCA RATIO:     2.3 DIASTOLIC ICA/CCA RATIO:    3.5 ECA:                        175cm/sec  Findings:  RIGHT CAROTID ARTERY: There is diffuse atherosclerotic disease at the carotid bulb extending into the right internal carotid artery. The carotid arteries are patent.  There is narrowing of the lumen in the proximal and mid internal carotid artery due to the extensive plaque.  RIGHT VERTEBRAL ARTERY:  Antegrade flow and normal waveform in the right vertebral artery.  LEFT CAROTID ARTERY: There is circumferential plaque at the left carotid bulb and extending into the internal and external carotid arteries.  Peak systolic velocity in the distal internal carotid artery is slightly elevated, measuring 136 cm/sec.  LEFT VERTEBRAL ARTERY:  Antegrade flow and normal waveform in the left vertebral artery.  IMPRESSION: Extensive atherosclerotic disease involving the carotid arteries bilaterally.  Based on the peak systolic velocities, the estimated degree of stenosis in the internal carotid arteries is 50-69% bilaterally.   Original Report Authenticated By: Richarda Overlie, M.D.   Dg Chest Port 1 View  10/04/2012   *RADIOLOGY REPORT*  Clinical Data: Chest pain.  PORTABLE CHEST - 1 VIEW  Comparison: Chest x-ray 05/02/2011.  Findings: Chronic elevation of the left hemidiaphragm redemonstrated. A small amount of scarring and/or subsegmental atelectasis is noted in the lung bases bilaterally.  No definite consolidative airspace disease.  No pleural effusions.  No evidence of pulmonary edema.  Heart size appears mildly enlarged. The patient is rotated to the left on today's exam, resulting in distortion of the mediastinal contours and reduced diagnostic sensitivity and specificity for mediastinal pathology. Atherosclerosis in the thoracic aorta.  IMPRESSION: 1.  No radiographic evidence of acute cardiopulmonary disease.  The appearance of the chest is very similar to prior studies, as above.   Original Report  Authenticated By: Trudie Reed, M.D.      Microbiology: Recent Results (from the past 240 hour(s))  MRSA PCR SCREENING  Status: None   Collection Time    10/04/12  6:18 AM      Result Value Range Status   MRSA by PCR NEGATIVE  NEGATIVE Final   Comment:            The GeneXpert MRSA Assay (FDA     approved for NASAL specimens     only), is one component of a     comprehensive MRSA colonization     surveillance program. It is not     intended to diagnose MRSA     infection nor to guide or     monitor treatment for     MRSA infections.     Labs: Results for orders placed during the hospital encounter of 10/04/12 (from the past 48 hour(s))  CBC WITH DIFFERENTIAL     Status: Abnormal   Collection Time    10/04/12  3:00 AM      Result Value Range   WBC 7.7  4.0 - 10.5 K/uL   RBC 4.48  3.87 - 5.11 MIL/uL   Hemoglobin 14.3  12.0 - 15.0 g/dL   HCT 24.4  01.0 - 27.2 %   MCV 93.3  78.0 - 100.0 fL   MCH 31.9  26.0 - 34.0 pg   MCHC 34.2  30.0 - 36.0 g/dL   RDW 53.6  64.4 - 03.4 %   Platelets 185  150 - 400 K/uL   Neutrophils Relative % 44  43 - 77 %   Neutro Abs 3.3  1.7 - 7.7 K/uL   Lymphocytes Relative 49 (*) 12 - 46 %   Lymphs Abs 3.8  0.7 - 4.0 K/uL   Monocytes Relative 5  3 - 12 %   Monocytes Absolute 0.4  0.1 - 1.0 K/uL   Eosinophils Relative 2  0 - 5 %   Eosinophils Absolute 0.2  0.0 - 0.7 K/uL   Basophils Relative 0  0 - 1 %   Basophils Absolute 0.0  0.0 - 0.1 K/uL  BASIC METABOLIC PANEL     Status: Abnormal   Collection Time    10/04/12  3:00 AM      Result Value Range   Sodium 138  135 - 145 mEq/L   Potassium 3.8  3.5 - 5.1 mEq/L   Chloride 102  96 - 112 mEq/L   CO2 24  19 - 32 mEq/L   Glucose, Bld 118 (*) 70 - 99 mg/dL   BUN 23  6 - 23 mg/dL   Creatinine, Ser 7.42  0.50 - 1.10 mg/dL   Calcium 9.7  8.4 - 59.5 mg/dL   GFR calc non Af Amer 81 (*) >90 mL/min   GFR calc Af Amer >90  >90 mL/min   Comment: (NOTE)     The eGFR has been calculated using the  CKD EPI equation.     This calculation has not been validated in all clinical situations.     eGFR's persistently <90 mL/min signify possible Chronic Kidney     Disease.  TROPONIN I     Status: None   Collection Time    10/04/12  3:00 AM      Result Value Range   Troponin I <0.30  <0.30 ng/mL   Comment:            Due to the release kinetics of cTnI,     a negative result within the first hours     of the onset of symptoms does not rule out  myocardial infarction with certainty.     If myocardial infarction is still suspected,     repeat the test at appropriate intervals.  PROTIME-INR     Status: Abnormal   Collection Time    10/04/12  3:00 AM      Result Value Range   Prothrombin Time 25.8 (*) 11.6 - 15.2 seconds   INR 2.45 (*) 0.00 - 1.49  URINALYSIS, ROUTINE W REFLEX MICROSCOPIC     Status: Abnormal   Collection Time    10/04/12  5:19 AM      Result Value Range   Color, Urine YELLOW  YELLOW   APPearance CLEAR  CLEAR   Specific Gravity, Urine 1.020  1.005 - 1.030   pH 6.0  5.0 - 8.0   Glucose, UA NEGATIVE  NEGATIVE mg/dL   Hgb urine dipstick NEGATIVE  NEGATIVE   Bilirubin Urine NEGATIVE  NEGATIVE   Ketones, ur NEGATIVE  NEGATIVE mg/dL   Protein, ur NEGATIVE  NEGATIVE mg/dL   Urobilinogen, UA 0.2  0.0 - 1.0 mg/dL   Nitrite POSITIVE (*) NEGATIVE   Leukocytes, UA TRACE (*) NEGATIVE  URINE MICROSCOPIC-ADD ON     Status: Abnormal   Collection Time    10/04/12  5:19 AM      Result Value Range   WBC, UA 21-50  <3 WBC/hpf   RBC / HPF 0-2  <3 RBC/hpf   Bacteria, UA MANY (*) RARE  MRSA PCR SCREENING     Status: None   Collection Time    10/04/12  6:18 AM      Result Value Range   MRSA by PCR NEGATIVE  NEGATIVE   Comment:            The GeneXpert MRSA Assay (FDA     approved for NASAL specimens     only), is one component of a     comprehensive MRSA colonization     surveillance program. It is not     intended to diagnose MRSA     infection nor to guide or      monitor treatment for     MRSA infections.  TSH     Status: None   Collection Time    10/04/12  6:52 AM      Result Value Range   TSH 0.863  0.350 - 4.500 uIU/mL   Comment: Performed at [facility]  TROPONIN I     Status: None   Collection Time    10/04/12  6:52 AM      Result Value Range   Troponin I <0.30  <0.30 ng/mL   Comment:            Due to the release kinetics of cTnI,     a negative result within the first hours     of the onset of symptoms does not rule out     myocardial infarction with certainty.     If myocardial infarction is still suspected,     repeat the test at appropriate intervals.  TROPONIN I     Status: None   Collection Time    10/04/12 12:48 PM      Result Value Range   Troponin I <0.30  <0.30 ng/mL   Comment:            Due to the release kinetics of cTnI,     a negative result within the first hours     of the onset of symptoms does not rule out  myocardial infarction with certainty.     If myocardial infarction is still suspected,     repeat the test at appropriate intervals.  PROTIME-INR     Status: Abnormal   Collection Time    10/05/12  6:20 AM      Result Value Range   Prothrombin Time 28.3 (*) 11.6 - 15.2 seconds   INR 2.77 (*) 0.00 - 1.49     HPI :*Andrea [name] is a 77 y.o. female with a past medical history of atrial fibrillation, hypertension, previous history of stroke, who was in her usual state of health about 1 AM this morning when she was sitting and watching TV with her husband when she started having chest pain in the retrosternal area. It was more than a pressure like sensation. She had eaten an enchilada at about 10:30 PM last night. She took one dose of nitroglycerin and this was followed by a syncopal episode. According to the husband she was unconscious for about 10 minutes. And, then she woke up and was still feeling a little lightheaded and continued to have chest pain and then she took another nitroglycerin  and then the pain resolved. The lightheadedness went away after some more time. Denies any headaches or visual disturbances. Denies any focal weakness. She was unable to quantify the chest pain. She does get pain from time to time, and has taken nitroglycerin in the past. Her husband also thinks that sometimes acid reflux causes her to have chest pain. She denied any shortness of breath. No nausea, vomiting. No palpitations. Denies any changes to her medications recently. She took all her home medications as prescribed yesterday. She said she was diagnosed with a urinary tract infection the first week of August and was give an injection of Rocephin by PCP. However, was not prescribed any further antibiotics to be taken at home. She does admit to some painful urination recently. She does have some dysarthria and speech impairment due to her stroke and so history is somewhat limited.Marland Kitchen Lexiscan Myoview on 07/17/12 was normal   HOSPITAL COURSE: Assessment/Plan:  1. Chest pain: DD: GERD, angina, musculoskeletal & others. Recent Myoview negative. Cardiac enzymes negative. Chest pain has resolved. Telemetry shows A. fib with controlled ventricular rate and occasional PVCs. Troponins x2 negative. 2-D echo shows an EF of 50-60% cavity size is normal. 2. Syncope: Probably due to NTG related hypotension in the setting of urinary tract infection. 2-D echo with results as above and carotid Dopplers unchanged from last year 50-69% stenosis. Telemetry without significant alarms. PT and OT evaluation. CT head negative. 3. UTI: Started on IV Rocephin pending urine culture results. Will switch to ciprofloxacin for 5 more days 4. Atrial fibrillation, with controlled ventricular rate. Beta blockers had been held on admission secondary to slow ventricular response but will resume at 12.5 mg a day. Also decreased oxybutynin from 10 mg to 5 mg because of bradycardia. She will need close followup with cardiology Dr. Allyson Sabal for  continued titration of her chronotropic medications. 5. Tobacco abuse: Patient recalcitrant to cessation advised. Continue nicotine patch. 6. Hypertension: Soft blood pressures. Initially but blood pressure has improved and the patient has been resumed on her ACE inhibitor and Imdur. 7. GERD: PPI. 8.    Discharge Exam  Blood pressure 182/75, pulse 68, temperature 97.7 F (36.5 C), temperature source Oral, resp. rate 20, height 5\' 5"  (1.651 m), weight 56 kg (123 lb 7.3 oz), SpO2 96.00%.   General exam: Comfortable. Elderly frail female patient  lying comfortably supine in bed  Respiratory system: Clear. No increased work of breathing.  Cardiovascular system: S1 & S2 heard, irregularly irregular. No JVD, murmurs, gallops, clicks or pedal edema. Telemetry: A. fib with controlled ventricular rate in the 50s to 60s and occasional PVCs.  Gastrointestinal system: Abdomen is nondistended, soft and nontender. Normal bowel sounds heard.  Central nervous system: Sleeping but easily aroused & oriented. No focal neurological deficits.  Extremities: Symmetric 5 x 5 power.  Neck: No carotid bruit         Signed: Richarda Overlie 10/05/2012, 7:48 AM

## 2012-10-05 NOTE — Progress Notes (Signed)
IV removed, site WNL.  Pt given d/c instructions, new prescriptions called in to her pharmacy  Discussed home care with patient and discussed home medications, patient verbalizes understanding, teachback completed. F/U appointments to be made by patient on Monday when offices are open, she states she will keep appointment. Pt is stable at this time. Pt taken to main entrance in pt's own wheelchair by staff member.

## 2012-10-05 NOTE — Progress Notes (Signed)
ANTICOAGULATION CONSULT NOTE   Pharmacy Consult for Coumadin (chronic PTA) Indication: atrial fibrillation  Allergies  Allergen Reactions  . Codeine   . Contrast Media [Iodinated Diagnostic Agents]     Per pt chart.  . Iodine   . Lovenox [Enoxaparin]   . Nitrofurantoin Other (See Comments)    Numbness in arms and legs  . Nitrofurantoin Monohyd Macro Other (See Comments)  . Penicillins Rash  . Sulfa Antibiotics Rash  . Sulfonamide Derivatives Rash   Patient Measurements: Height: 5\' 5"  (165.1 cm) Weight: 123 lb 7.3 oz (56 kg) IBW/kg (Calculated) : 57  Vital Signs: Temp: 97.7 F (36.5 C) (08/16 0557) BP: 182/75 mmHg (08/16 0557) Pulse Rate: 68 (08/16 0557)  Labs:  Recent Labs  10/04/12 0300 10/04/12 0652 10/04/12 1248 10/05/12 0620  HGB 14.3  --   --   --   HCT 41.8  --   --   --   PLT 185  --   --   --   LABPROT 25.8*  --   --  28.3*  INR 2.45*  --   --  2.77*  CREATININE 0.75  --   --   --   TROPONINI <0.30 <0.30 <0.30  --    Estimated Creatinine Clearance: 53.7 ml/min (by C-G formula based on Cr of 0.75).  Medical History: Past Medical History  Diagnosis Date  . Hypertension   . Stroke   . Acid reflux   . Atrial fibrillation   . Cancer     facial  . Tobacco abuse 04/30/2011  . Cardiomyopathy 05/02/2011    EF 45-50%, per Echo.  . 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 Carotid artery stenosis 05/02/2011  . CAD (coronary artery disease) 07/20/2006    R/S MV - mild perfusion defect in apical region consistent w/ infarct/scar vs apical thinning; no scintigraphic evidence of inducible myocardial ischemia; baseline EKG demonstrates A Flutter w/ variable AV conduction  . Hyperlipidemia   . Chest pain   . Cerebral atherosclerosis 12/11/2011    Carotid duplex - R ICA 0-49% diameter reduction, velocities suggest high end; L ICA 0-49% reduction, velocities suggest mid range  . Neuropathy    Medications:  Prescriptions prior to admission  Medication Sig  Dispense Refill  . atorvastatin (LIPITOR) 10 MG tablet Take 10 mg by mouth at bedtime.      . benazepril (LOTENSIN) 20 MG tablet Take 20 mg by mouth every morning.       . estazolam (PROSOM) 2 MG tablet Take 2 mg by mouth at bedtime.      Marland Kitchen CALCIUM-VITAMIN D PO Take 1 tablet by mouth daily.      Marland Kitchen docusate sodium (COLACE) 100 MG capsule Take 1 capsule (100 mg total) by mouth every 12 (twelve) hours.  30 capsule  0  . esomeprazole (NEXIUM) 20 MG capsule Take 20 mg by mouth daily before breakfast.      . isosorbide mononitrate (IMDUR) 30 MG 24 hr tablet Take 30 mg by mouth daily.      Marland Kitchen MYRBETRIQ 25 MG TB24 Take 25 mg by mouth daily.      . niacin 500 MG tablet Take 500 mg by mouth daily.      Marland Kitchen NITROSTAT 0.4 MG SL tablet Place 0.4 mg under the tongue as needed.      . prazosin (MINIPRESS) 2 MG capsule Take 2 mg by mouth every evening.       . triamcinolone cream (KENALOG) 0.1 %  Apply topically 2 (two) times daily.      Marland Kitchen warfarin (COUMADIN) 3 MG tablet Take 3 mg by mouth as directed. On Saturdays and Sundays only. Take 4mg  on Monday through Friday      . warfarin (COUMADIN) 4 MG tablet Take 4 mg by mouth daily. *take 4mg  on Mondays through Fridays, take 3mg  on Saturday and Sunday*      . [DISCONTINUED] metoprolol tartrate (LOPRESSOR) 25 MG tablet Take 25 mg by mouth every morning.      . [DISCONTINUED] oxybutynin (DITROPAN-XL) 10 MG 24 hr tablet Take 10 mg by mouth every morning.        Assessment: 77yo female on chronic Coumadin PTA for h/o afib.  INR is therapeutic on admission.  Home dose listed above.    Goal of Therapy:  INR 2-3 Monitor platelets by anticoagulation protocol: Yes   Plan:  Coumadin 3mg  po today x 1 (home dose) INR daily  Valrie Hart A 10/05/2012,9:09 AM

## 2012-10-06 LAB — URINE CULTURE

## 2012-10-16 ENCOUNTER — Ambulatory Visit (INDEPENDENT_AMBULATORY_CARE_PROVIDER_SITE_OTHER): Payer: Medicare Other | Admitting: Physician Assistant

## 2012-10-16 ENCOUNTER — Encounter: Payer: Self-pay | Admitting: Physician Assistant

## 2012-10-16 VITALS — BP 122/68 | HR 56 | Ht 62.0 in | Wt 119.6 lb

## 2012-10-16 DIAGNOSIS — Z720 Tobacco use: Secondary | ICD-10-CM

## 2012-10-16 DIAGNOSIS — F17200 Nicotine dependence, unspecified, uncomplicated: Secondary | ICD-10-CM

## 2012-10-16 DIAGNOSIS — I10 Essential (primary) hypertension: Secondary | ICD-10-CM

## 2012-10-16 DIAGNOSIS — I4891 Unspecified atrial fibrillation: Secondary | ICD-10-CM

## 2012-10-16 NOTE — Patient Instructions (Addendum)
Follow up with Dr. Berry in 6 months.  

## 2012-10-16 NOTE — Assessment & Plan Note (Signed)
Ejection fraction is improved with results and new echocardiogram. Ejection fraction is now 55-60%.

## 2012-10-16 NOTE — Progress Notes (Signed)
Date:  10/16/2012   ID:  Andrea [name], DOB 03/15/1935, MRN [id number]  PCP:  Cassell Smiles., MD  Primary Cardiologist:  Allyson Sabal     History of Present Illness: Andrea [name] is a 77 y.o. female history of hypertension, CVA, acid reflux, atrial fibrillation, cancer, continued tobacco abuse, cardiomyopathy with an ejection fraction of 45% by echo March 2013, coronary stenosis, coronary artery disease, hyperlipidemia.  Patient's most recent nuclear stress test was May 29th 2014 normal wall motion normal LV function and overall was considered low risk with apical thinning.  Patient was recently hospitalized discharged 10/05/2012 after developing chest pain after eating an enchilada which was then followed by her taking nitroglycerin and having a syncopal episode.according to her husband she had been unconscious for approximately 10 minutes.  According to her husband she does occasionally have acid reflux which causes her chest pain.  EKG at the time H. Fibrillation with controlled ventricular rate she remains in the leads V1 to nonspecific T wave changes. Similar to previous EKG.  Just prior to this hospitalization she had been diagnosed with UTI by her primary care provider and given an injection of Rocephin.  Patient presents today for followup to that hospitalization.  Patient had an echocardiogram 10/04/2012 which showed improved ejection fraction over her prior and is now 55-60% with normal wall motion and mild mitral valve regurgitation. Right atrium is mild to moderately dilated. Patient states she feels much better. She does continue to smoke and she feels that at her age it makes her feel better, so she will continue to do so.   currently denies nausea, vomiting, fever, chest pain, shortness of breath, orthopnea, dizziness, PND, cough, congestion, abdominal pain, hematochezia, melena, lower extremity edema.  Wt Readings from Last 3 Encounters:  10/16/12 119 lb 9.6 oz (54.25 kg)    10/04/12 123 lb 7.3 oz (56 kg)  07/17/12 119 lb (53.978 kg)     Past Medical History  Diagnosis Date  . Hypertension   . Stroke   . Acid reflux   . Atrial fibrillation   . Cancer     facial  . Tobacco abuse 04/30/2011  . Cardiomyopathy 05/02/2011    EF 45-50%, per Echo.  . 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 Carotid artery stenosis 05/02/2011  . CAD (coronary artery disease) 07/20/2006    R/S MV - mild perfusion defect in apical region consistent w/ infarct/scar vs apical thinning; no scintigraphic evidence of inducible myocardial ischemia; baseline EKG demonstrates A Flutter w/ variable AV conduction  . Hyperlipidemia   . Chest pain   . Cerebral atherosclerosis 12/11/2011    Carotid duplex - R ICA 0-49% diameter reduction, velocities suggest high end; L ICA 0-49% reduction, velocities suggest mid range  . Neuropathy     Current Outpatient Prescriptions  Medication Sig Dispense Refill  . atorvastatin (LIPITOR) 10 MG tablet Take 10 mg by mouth at bedtime.      . benazepril (LOTENSIN) 20 MG tablet Take 20 mg by mouth every morning.       Marland Kitchen CALCIUM-VITAMIN D PO Take 1 tablet by mouth daily.      . ciprofloxacin (CIPRO) 500 MG tablet Take 500 mg by mouth 2 (two) times daily.      Marland Kitchen docusate sodium (COLACE) 100 MG capsule Take 1 capsule (100 mg total) by mouth every 12 (twelve) hours.  30 capsule  0  . esomeprazole (NEXIUM) 20 MG capsule Take 20 mg by mouth  daily before breakfast.      . estazolam (PROSOM) 2 MG tablet Take 2 mg by mouth at bedtime.      . isosorbide mononitrate (IMDUR) 30 MG 24 hr tablet Take 30 mg by mouth daily.      . metoprolol tartrate (LOPRESSOR) 12.5 mg TABS tablet Take 0.5 tablets (12.5 mg total) by mouth daily.  60 tablet  2  . MYRBETRIQ 25 MG TB24 Take 25 mg by mouth daily.      . niacin 500 MG tablet Take 500 mg by mouth daily.      Marland Kitchen NITROSTAT 0.4 MG SL tablet Place 0.4 mg under the tongue as needed.      Marland Kitchen oxybutynin (DITROPAN-XL) 5 MG 24 hr  tablet Take 1 tablet (5 mg total) by mouth every morning.  30 tablet  2  . prazosin (MINIPRESS) 2 MG capsule Take 2 mg by mouth every evening.       . triamcinolone cream (KENALOG) 0.1 % Apply topically 2 (two) times daily.      Marland Kitchen warfarin (COUMADIN) 3 MG tablet Take 3 mg by mouth as directed. On Saturdays and Sundays only. Take 4mg  on Monday through Friday      . warfarin (COUMADIN) 4 MG tablet Take 4 mg by mouth daily. *take 4mg  on Mondays through Fridays, take 3mg  on Saturday and Sunday*       No current facility-administered medications for this visit.    Allergies:    Allergies  Allergen Reactions  . Codeine   . Contrast Media [Iodinated Diagnostic Agents]     Per pt chart.  . Iodine   . Lovenox [Enoxaparin]   . Nitrofurantoin Other (See Comments)    Numbness in arms and legs  . Nitrofurantoin Monohyd Macro Other (See Comments)  . Penicillins Rash  . Sulfa Antibiotics Rash  . Sulfonamide Derivatives Rash    Social History:  The patient  reports that she has been smoking Cigarettes.  She has a 20 pack-year smoking history. She has never used smokeless tobacco. She reports that she does not drink alcohol or use illicit drugs.   Family history:   Family History  Problem Relation Age of Onset  . Adopted: Yes    ROS:  Please see the history of present illness.  All other systems reviewed and negative.   PHYSICAL EXAM: VS:  BP 122/68  Pulse 56  Ht 5\' 2"  (1.575 m)  Wt 119 lb 9.6 oz (54.25 kg)  BMI 21.87 kg/m2 Well nourished, well developed, in no acute distress HEENT: Pupils are equal round react to light accommodation extraocular movements are intact.  Neck: no JVDNo cervical lymphadenopathy. Cardiac: Regular rate and rhythm without murmurs rubs or gallops. Lungs:  clear to auscultation bilaterally, no wheezing, rhonchi or rales Abd: soft, nontender, positive bowel sounds all quadrants, no hepatosplenomegaly Ext: no lower extremity edema.  2+ radial and dorsalis pedis  pulses. Skin: warm and dry Neuro:  Grossly normal  EKG:    ASSESSMENT AND PLAN:  Problem List Items Addressed This Visit   Tobacco abuse - Primary   Essential hypertension     Patient's blood pressure is well-controlled.    Atrial fibrillation (Chronic)     This is chronic with a controlled ventricular rate. Patient is on Coumadin.patient's last INR was 2.77 and that was on August 16th      Patient's had no further chest pain since her discharge. Is likely related to acid reflux. To follow up with Dr. Allyson Sabal  in 6 months.

## 2012-10-16 NOTE — Assessment & Plan Note (Signed)
This is chronic with a controlled ventricular rate. Patient is on Coumadin.patient's last INR was 2.77 and that was on August 16th

## 2012-10-16 NOTE — Assessment & Plan Note (Signed)
Patient's blood pressure is well-controlled. 

## 2012-12-12 ENCOUNTER — Other Ambulatory Visit: Payer: Self-pay | Admitting: *Deleted

## 2012-12-12 MED ORDER — ISOSORBIDE MONONITRATE ER 30 MG PO TB24: 30.0000 mg | ORAL_TABLET | Freq: Every day | ORAL | Status: DC

## 2012-12-12 NOTE — Telephone Encounter (Signed)
Rx was sent to pharmacy electronically. 

## 2013-01-13 ENCOUNTER — Ambulatory Visit (INDEPENDENT_AMBULATORY_CARE_PROVIDER_SITE_OTHER): Payer: Medicare Other | Admitting: Physician Assistant

## 2013-01-13 ENCOUNTER — Encounter: Payer: Self-pay | Admitting: Physician Assistant

## 2013-01-13 VITALS — BP 110/60 | HR 69 | Ht 62.0 in | Wt 117.0 lb

## 2013-01-13 DIAGNOSIS — I4891 Unspecified atrial fibrillation: Secondary | ICD-10-CM

## 2013-01-13 DIAGNOSIS — F17200 Nicotine dependence, unspecified, uncomplicated: Secondary | ICD-10-CM

## 2013-01-13 DIAGNOSIS — Z720 Tobacco use: Secondary | ICD-10-CM

## 2013-01-13 DIAGNOSIS — I10 Essential (primary) hypertension: Secondary | ICD-10-CM

## 2013-01-13 DIAGNOSIS — E785 Hyperlipidemia, unspecified: Secondary | ICD-10-CM

## 2013-01-13 NOTE — Patient Instructions (Signed)
Follow up with Dr. Allyson Sabal is 3 Months.

## 2013-01-13 NOTE — Assessment & Plan Note (Signed)
Has cut back but not ready to quit

## 2013-01-13 NOTE — Progress Notes (Signed)
Date:  01/13/2013   ID:  Burton Apley, DOB February 16, 1936, MRN [id number]  PCP:  Cassell Smiles., MD  Primary Cardiologist:  Allyson Sabal    History of Present Illness: Andrea [name] is a 77 y.o. female history of hypertension, CVA, acid reflux, atrial fibrillation, cancer, continued tobacco abuse, cardiomyopathy with an ejection fraction of 45% by echo March 2013, coronary stenosis, coronary artery disease, hyperlipidemia. Patient's most recent nuclear stress test was May 29th 2014 and revealed normal wall motion normal LV function and overall was considered low risk with apical thinning. Patient was hospitalized and discharged 10/05/2012 after developing chest pain after eating an enchilada which was then followed by her taking nitroglycerin and having a syncopal episode.  According to her husband she had been unconscious for approximately 10 minutes.  He stated she does occasionally have acid reflux which causes her chest pain. EKG at the time showed atrial fibrillation with controlled ventricular rate.  Nonspecific T wave changes. Similar to previous EKG.  An echocardiogram 10/04/2012 showed improved ejection fraction over her prior and is now 55-60% with normal wall motion and mild mitral valve regurgitation. Right atrium is mild to moderately dilated. Patient states she feels much better. She does continue to smoke and she feels that at her age it makes her feel better, so she will continue to do so.  She presents today for her six month evaluation.  She is going to her granddaughters for Thanksgiving.  She reports doing well.  Still smoking but has cut back some. She currently denies nausea, vomiting, fever, chest pain, shortness of breath, orthopnea, dizziness, PND, cough, congestion, abdominal pain, hematochezia, melena, lower extremity edema, claudication.  Wt Readings from Last 3 Encounters:  01/13/13 117 lb (53.071 kg)  10/16/12 119 lb 9.6 oz (54.25 kg)  10/04/12 123 lb 7.3 oz (56  kg)     Past Medical History  Diagnosis Date  . Hypertension   . Stroke   . Acid reflux   . Atrial fibrillation   . Cancer     facial  . Tobacco abuse 04/30/2011  . Cardiomyopathy 05/02/2011    EF 45-50%, per Echo.  . 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 Carotid artery stenosis 05/02/2011  . CAD (coronary artery disease) 07/20/2006    R/S MV - mild perfusion defect in apical region consistent w/ infarct/scar vs apical thinning; no scintigraphic evidence of inducible myocardial ischemia; baseline EKG demonstrates A Flutter w/ variable AV conduction  . Hyperlipidemia   . Chest pain   . Cerebral atherosclerosis 12/11/2011    Carotid duplex - R ICA 0-49% diameter reduction, velocities suggest high end; L ICA 0-49% reduction, velocities suggest mid range  . Neuropathy     Current Outpatient Prescriptions  Medication Sig Dispense Refill  . atorvastatin (LIPITOR) 10 MG tablet Take 10 mg by mouth at bedtime.      . benazepril (LOTENSIN) 20 MG tablet Take 20 mg by mouth every morning.       Marland Kitchen CALCIUM-VITAMIN D PO Take 1 tablet by mouth daily.      . ciprofloxacin (CIPRO) 500 MG tablet Take 500 mg by mouth 2 (two) times daily.      Marland Kitchen docusate sodium (COLACE) 100 MG capsule Take 1 capsule (100 mg total) by mouth every 12 (twelve) hours.  30 capsule  0  . esomeprazole (NEXIUM) 20 MG capsule Take 20 mg by mouth daily before breakfast.      . estazolam (PROSOM) 2  MG tablet Take 2 mg by mouth at bedtime.      . isosorbide mononitrate (IMDUR) 30 MG 24 hr tablet Take 1 tablet (30 mg total) by mouth daily.  30 tablet  9  . metoprolol tartrate (LOPRESSOR) 12.5 mg TABS tablet Take 0.5 tablets (12.5 mg total) by mouth daily.  60 tablet  2  . MYRBETRIQ 25 MG TB24 Take 25 mg by mouth daily.      . niacin 500 MG tablet Take 500 mg by mouth daily.      Marland Kitchen NITROSTAT 0.4 MG SL tablet Place 0.4 mg under the tongue as needed.      Marland Kitchen oxybutynin (DITROPAN-XL) 5 MG 24 hr tablet Take 1 tablet (5 mg total)  by mouth every morning.  30 tablet  2  . prazosin (MINIPRESS) 2 MG capsule Take 2 mg by mouth every evening.       . triamcinolone cream (KENALOG) 0.1 % Apply topically 2 (two) times daily.      Marland Kitchen trimethoprim (TRIMPEX) 100 MG tablet       . warfarin (COUMADIN) 3 MG tablet Take 3 mg by mouth as directed. On Saturdays and Sundays only. Take 4mg  on Monday through Friday      . warfarin (COUMADIN) 4 MG tablet Take 4 mg by mouth daily. *take 4mg  on Mondays through Fridays, take 3mg  on Saturday and Sunday*       No current facility-administered medications for this visit.    Allergies:    Allergies  Allergen Reactions  . Codeine   . Contrast Media [Iodinated Diagnostic Agents]     Per pt chart.  . Iodine   . Lovenox [Enoxaparin]   . Nitrofurantoin Other (See Comments)    Numbness in arms and legs  . Nitrofurantoin Monohyd Macro Other (See Comments)  . Penicillins Rash  . Sulfa Antibiotics Rash  . Sulfonamide Derivatives Rash    Social History:  The patient  reports that she has been smoking Cigarettes.  She has a 20 pack-year smoking history. She has never used smokeless tobacco. She reports that she does not drink alcohol or use illicit drugs.   Family history:   Family History  Problem Relation Age of Onset  . Adopted: Yes    ROS:  Please see the history of present illness.  All other systems reviewed and negative.   PHYSICAL EXAM: VS:  BP 110/60  Pulse 69  Ht 5\' 2"  (1.575 m)  Wt 117 lb (53.071 kg)  BMI 21.39 kg/m2 Well nourished, well developed, in no acute distress HEENT: Pupils are equal round react to light accommodation extraocular movements are intact.  Neck: no JVD Cardiac: Irregular rate and rhythm without murmurs rubs or gallops. Lungs:  Decreased BS on the left, no wheezing, rhonchi or rales Abd: soft, nontender, positive bowel sounds all quadrants, no hepatosplenomegaly Ext: no lower extremity edema.  2+ radial and dorsalis pedis pulses. Skin: warm and  dry Neuro:  Grossly normal  EKG:  Afib, PVCs, 69 BPM.  Septal Q waves     ASSESSMENT AND PLAN:  Problem List Items Addressed This Visit   Tobacco abuse     Has cut back but not ready to quit    Hyperlipidemia     Treated with Lipitor. Last lipid panel, 06/12/12: TC 108, TG 81, HDL 33, LDL, 59    Essential hypertension     BP well controlled.    Atrial fibrillation - Primary (Chronic)     Maintaining Afib with  a controlled rate.  INR therapeutic last week.

## 2013-01-13 NOTE — Assessment & Plan Note (Signed)
Treated with Lipitor. Last lipid panel, 06/12/12: TC 108, TG 81, HDL 33, LDL, 59

## 2013-01-13 NOTE — Assessment & Plan Note (Signed)
BP well controlled.

## 2013-01-13 NOTE — Assessment & Plan Note (Signed)
Maintaining Afib with a controlled rate.  INR therapeutic last week.

## 2013-01-14 ENCOUNTER — Encounter: Payer: Self-pay | Admitting: Physician Assistant

## 2013-04-08 ENCOUNTER — Ambulatory Visit: Payer: Medicare Other | Admitting: Cardiovascular Disease

## 2013-05-13 ENCOUNTER — Encounter: Payer: Self-pay | Admitting: Cardiovascular Disease

## 2013-05-13 ENCOUNTER — Ambulatory Visit (INDEPENDENT_AMBULATORY_CARE_PROVIDER_SITE_OTHER): Payer: Medicare Other | Admitting: Cardiovascular Disease

## 2013-05-13 VITALS — BP 120/70 | HR 72 | Ht 62.0 in | Wt 111.0 lb

## 2013-05-13 DIAGNOSIS — I4891 Unspecified atrial fibrillation: Secondary | ICD-10-CM

## 2013-05-13 DIAGNOSIS — F17200 Nicotine dependence, unspecified, uncomplicated: Secondary | ICD-10-CM

## 2013-05-13 DIAGNOSIS — E785 Hyperlipidemia, unspecified: Secondary | ICD-10-CM

## 2013-05-13 DIAGNOSIS — I10 Essential (primary) hypertension: Secondary | ICD-10-CM

## 2013-05-13 DIAGNOSIS — Z720 Tobacco use: Secondary | ICD-10-CM

## 2013-05-13 NOTE — Assessment & Plan Note (Signed)
On statin therapy with woodwork recently performed by her PCP revealing a total cholesterol of 102, LDL 48 and HDL of 39

## 2013-05-13 NOTE — Patient Instructions (Signed)
Your physician wants you to follow-up in: 6 months with Bryan Hager and 1 year with Dr.Berry. You will receive a reminder letter in the mail two months in advance. If you don't receive a letter, please call our office to schedule the follow-up appointment.  

## 2013-05-13 NOTE — Assessment & Plan Note (Signed)
She continues to smoke and is recalcitrant to risk factor modification

## 2013-05-13 NOTE — Assessment & Plan Note (Signed)
Well-controlled on current medications 

## 2013-05-13 NOTE — Progress Notes (Signed)
05/13/2013 Andrea [name]   08/10/1935  [id number]  Primary Physician Glo Herring., MD Primary Cardiologist: Lorretta Harp MD Renae Gloss   HPI:  The patient is a 78 year old, thin and frail appearing, married, Caucasian female, mother of four, who I last saw in the office six months ago. She has a history of continued tobacco abuse of one and a half packs per day, recalcitrant to risk factor modification. She has chronic A fib, rate controlled on Coumadin anticoagulation, followed by [facility]. She has had a failed TEE cardioversion in the past. Other problems include: Hypertension and hyperlipidemia. She did have a stroke in June 2012 and another stroke apparently in March 2013 in the left MCA distribution leaving her with dysarthria and right-sided paresis.   An echo done during her hospitalization revealed an EF of 45 to 50% with severe left atrial dilatation.  She was seen in the office by Cecilie Kicks registered nurse practitioner 06/11/12 which is pain. 2 she was begun on oral nitrate it is related to increased frequency of chest pain. A stress test was suggested but the patient declined when she was seen last. She did have a Persantine Myoview performed in 2008 showed mild apical defect.  She saw Tenny Craw PA-C 01/13/13. Since that time she denies chest pain or shortness of breath.     Current Outpatient Prescriptions  Medication Sig Dispense Refill  . atorvastatin (LIPITOR) 10 MG tablet Take 10 mg by mouth at bedtime.      . benazepril (LOTENSIN) 20 MG tablet Take 10 mg by mouth every morning.       Marland Kitchen CALCIUM-VITAMIN D PO Take 1 tablet by mouth daily.      Marland Kitchen esomeprazole (NEXIUM) 20 MG capsule Take 20 mg by mouth daily before breakfast.      . estazolam (PROSOM) 2 MG tablet Take 2 mg by mouth at bedtime.      . isosorbide mononitrate (IMDUR) 30 MG 24 hr tablet Take 1 tablet (30 mg total) by mouth daily.  30 tablet  9  . metoprolol tartrate  (LOPRESSOR) 12.5 mg TABS tablet Take 0.5 tablets (12.5 mg total) by mouth daily.  60 tablet  2  . MYRBETRIQ 25 MG TB24 Take 25 mg by mouth daily.      . niacin 500 MG tablet Take 500 mg by mouth daily.      Marland Kitchen NITROSTAT 0.4 MG SL tablet Place 0.4 mg under the tongue as needed.      Marland Kitchen oxybutynin (DITROPAN-XL) 5 MG 24 hr tablet Take 1 tablet (5 mg total) by mouth every morning.  30 tablet  2  . prazosin (MINIPRESS) 2 MG capsule Take 2 mg by mouth every evening.       . trimethoprim (TRIMPEX) 100 MG tablet       . warfarin (COUMADIN) 3 MG tablet Take 3 mg by mouth as directed. On Saturdays and Sundays only. Take 4mg  on Monday through Friday      . warfarin (COUMADIN) 4 MG tablet Take 4 mg by mouth daily. *take 4mg  on Mondays through Fridays, take 3mg  on Saturday and Sunday*       No current facility-administered medications for this visit.    Allergies  Allergen Reactions  . Codeine   . Contrast Media [Iodinated Diagnostic Agents]     Per pt chart.  . Iodine   . Lovenox [Enoxaparin]   . Nitrofurantoin Other (See Comments)    Numbness in arms and  legs  . Nitrofurantoin Monohyd Macro Other (See Comments)  . Penicillins Rash  . Sulfa Antibiotics Rash  . Sulfonamide Derivatives Rash    History   Social History  . Marital Status: Married    Spouse Name: N/A    Number of Children: N/A  . Years of Education: N/A   Occupational History  . Not on file.   Social History Main Topics  . Smoking status: Current Every Day Smoker -- 0.50 packs/day for 40 years    Types: Cigarettes  . Smokeless tobacco: Never Used  . Alcohol Use: No  . Drug Use: No  . Sexual Activity: Not Currently   Other Topics Concern  . Not on file   Social History Narrative  . No narrative on file     Review of Systems: General: negative for chills, fever, night sweats or weight changes.  Cardiovascular: negative for chest pain, dyspnea on exertion, edema, orthopnea, palpitations, paroxysmal nocturnal dyspnea  or shortness of breath Dermatological: negative for rash Respiratory: negative for cough or wheezing Urologic: negative for hematuria Abdominal: negative for nausea, vomiting, diarrhea, bright red blood per rectum, melena, or hematemesis Neurologic: negative for visual changes, syncope, or dizziness All other systems reviewed and are otherwise negative except as noted above.    Blood pressure 120/70, pulse 72, height 5\' 2"  (1.575 m), weight 111 lb (50.349 kg).  General appearance: alert and no distress Neck: no adenopathy, no carotid bruit, no JVD, supple, symmetrical, trachea midline and thyroid not enlarged, symmetric, no tenderness/mass/nodules Lungs: clear to auscultation bilaterally Heart: irregularly irregular rhythm Extremities: extremities normal, atraumatic, no cyanosis or edema  EKG atrial fibrillation with a ventricular response of 63, septal Q waves and left axis deviation  ASSESSMENT AND PLAN:   Hyperlipidemia On statin therapy with woodwork recently performed by her PCP revealing a total cholesterol of 102, LDL 48 and HDL of 39  Tobacco abuse She continues to smoke and is recalcitrant to risk factor modification  Atrial fibrillation Rate controlled on Coumadin anticoagulation  Essential hypertension Well-controlled on current medications      Lorretta Harp MD [facility], [facility] 05/13/2013 3:47 PM

## 2013-05-13 NOTE — Assessment & Plan Note (Signed)
Rate controlled on Coumadin anticoagulation 

## 2013-05-16 ENCOUNTER — Other Ambulatory Visit: Payer: Self-pay | Admitting: *Deleted

## 2013-05-16 MED ORDER — ATORVASTATIN CALCIUM 10 MG PO TABS: 10.0000 mg | ORAL_TABLET | Freq: Every day | ORAL | Status: DC

## 2013-05-16 NOTE — Telephone Encounter (Signed)
Atorvastatin 10mg  refilled #30 w/11 refills.

## 2013-06-17 ENCOUNTER — Other Ambulatory Visit (HOSPITAL_COMMUNITY): Payer: Self-pay | Admitting: Physician Assistant

## 2013-06-17 DIAGNOSIS — Z1231 Encounter for screening mammogram for malignant neoplasm of breast: Secondary | ICD-10-CM

## 2013-06-17 DIAGNOSIS — Z139 Encounter for screening, unspecified: Secondary | ICD-10-CM

## 2013-07-10 ENCOUNTER — Ambulatory Visit (HOSPITAL_COMMUNITY)
Admission: RE | Admit: 2013-07-10 | Discharge: 2013-07-10 | Disposition: A | Discharge status: Home / Self Care | Payer: Medicare Other | Source: Ambulatory Visit | Attending: Physician Assistant | Admitting: Physician Assistant

## 2013-07-10 DIAGNOSIS — Z1231 Encounter for screening mammogram for malignant neoplasm of breast: Secondary | ICD-10-CM | POA: Insufficient documentation

## 2013-09-05 ENCOUNTER — Telehealth: Payer: Self-pay | Admitting: Cardiovascular Disease

## 2013-09-05 NOTE — Telephone Encounter (Signed)
Returned call to [place] Pink case Freight forwarder with [employer]. She stated she is very concerned about patient.Stated patient had another syncopal episode this week and patient refused to go to ER.Stated she has had 3 syncopal episodes since 1/15.Stated her memory problem seems worse.Stated she thinks something is going on and would like her seen.Appointment scheduled with Lyda Jester PA  09/19/13 at 11:30 am.Advised to go to ER if she has another syncopal episode.Message sent to [facility] for review.

## 2013-09-19 ENCOUNTER — Encounter: Payer: Self-pay | Admitting: Cardiology

## 2013-09-19 ENCOUNTER — Telehealth: Payer: Self-pay | Admitting: Cardiovascular Disease

## 2013-09-19 ENCOUNTER — Ambulatory Visit (INDEPENDENT_AMBULATORY_CARE_PROVIDER_SITE_OTHER): Payer: Medicare Other | Admitting: Cardiology

## 2013-09-19 VITALS — BP 156/78 | HR 81 | Ht 62.0 in | Wt 109.0 lb

## 2013-09-19 DIAGNOSIS — R55 Syncope and collapse: Secondary | ICD-10-CM

## 2013-09-19 NOTE — Progress Notes (Signed)
Patient ID: Andrea [name], female   DOB: 06-Dec-1935, 77 y.o.   MRN: [id number]    09/19/2013 Andrea [name]   1935-04-14  [id number]  Primary Physicia Glo Herring., MD Primary Cardiologist: Dr.  Gwenlyn Found  HPI:  The patient is a 78 y/o female, followed by Dr. Gwenlyn Found. She has a history of continued tobacco abuse, averagging one and a half packs per day, recalcitrant to risk factor modification. She has chronic A fib, rate controlled and is on Coumadin for anticoagulation, followed by [facility] She has failed TEE cardioversion in the past. Other problems include hypertension and hyperlipidemia. She did have a stroke in June 2012 and another stroke apparently in March 2013 in the left MCA distribution leaving her with dysarthria and right-sided paresis. Her last 2-D echocardiogram was 10/04/2012 revealing normal left ventricular systolic function with an estimated ejection fraction 55-60%. Mild mitral regurgitation was noted. Her last stress test was a Persantine Myoview performed in 2008 which demonstrated mild apical defect. She also has bilateral carotid artery disease. Last Doppler studies were worried year ago which revealed extensive atherosclerotic disease involving the carotid arteries bilaterally. Based on the peak systolic velocities, the estimated degree of stenosis in the internal carotid arteries was 50-69% bilaterally. Her last office visit was with Dr. Gwenlyn Found on 05/13/2013 for routine followup. At that time, she was felt to be stable from a cardiac standpoint.  She presents to clinic today with a complaint of syncope. She notes 2 spells in the last month. One occurred while having a bowel movement at which time she noted to be constipated and was straining. She feels that this episode may have been vagally provoked.  She recalls blacking out for several minutes. Her husband found her on the toliet. Luckily, she did not fall off the toilet and hit her head. She denies any prodromal  symptoms prior to event, including no chest pain, palpitations, no dizziness or dyspnea. She also reports another episode that occurred several days ago while eating at her dining room table. Again, she had no prior symptoms. This was witnessed by her husband. She was noted to have lost consciousness for several seconds. She did not fall out of her chair. She noted feeling fine when she regained consciousness. She did not seek emergency medical evaluation for either episode.   Current Outpatient Prescriptions  Medication Sig Dispense Refill  . atorvastatin (LIPITOR) 10 MG tablet Take 1 tablet (10 mg total) by mouth at bedtime.  30 tablet  11  . benazepril (LOTENSIN) 20 MG tablet Take 10 mg by mouth every morning.       Marland Kitchen CALCIUM-VITAMIN D PO Take 1 tablet by mouth daily.      Marland Kitchen esomeprazole (NEXIUM) 20 MG capsule Take 20 mg by mouth daily before breakfast.      . estazolam (PROSOM) 2 MG tablet Take 2 mg by mouth at bedtime.      . isosorbide mononitrate (IMDUR) 30 MG 24 hr tablet Take 1 tablet (30 mg total) by mouth daily.  30 tablet  9  . metoprolol tartrate (LOPRESSOR) 12.5 mg TABS tablet Take 0.5 tablets (12.5 mg total) by mouth daily.  60 tablet  2  . MYRBETRIQ 25 MG TB24 Take 25 mg by mouth daily.      . niacin 500 MG tablet Take 500 mg by mouth daily.      Marland Kitchen NITROSTAT 0.4 MG SL tablet Place 0.4 mg under the tongue as needed.      Marland Kitchen  oxybutynin (DITROPAN-XL) 5 MG 24 hr tablet Take 1 tablet (5 mg total) by mouth every morning.  30 tablet  2  . prazosin (MINIPRESS) 2 MG capsule Take 2 mg by mouth every evening.       . trimethoprim (TRIMPEX) 100 MG tablet       . warfarin (COUMADIN) 3 MG tablet Take 3 mg by mouth as directed. On Saturdays and Sundays only. Take 4mg  on Monday through Friday      . warfarin (COUMADIN) 4 MG tablet Take 4 mg by mouth daily. *take 4mg  on Mondays through Fridays, take 3mg  on Saturday and Sunday*       No current facility-administered medications for this visit.     Allergies  Allergen Reactions  . Codeine   . Contrast Media [Iodinated Diagnostic Agents]     Per pt chart.  . Iodine   . Lovenox [Enoxaparin]   . Nitrofurantoin Other (See Comments)    Numbness in arms and legs  . Nitrofurantoin Monohyd Macro Other (See Comments)  . Penicillins Rash  . Sulfa Antibiotics Rash  . Sulfonamide Derivatives Rash    History   Social History  . Marital Status: Married    Spouse Name: N/A    Number of Children: N/A  . Years of Education: N/A   Occupational History  . Not on file.   Social History Main Topics  . Smoking status: Current Every Day Smoker -- 0.50 packs/day for 40 years    Types: Cigarettes  . Smokeless tobacco: Never Used  . Alcohol Use: No  . Drug Use: No  . Sexual Activity: Not Currently   Other Topics Concern  . Not on file   Social History Narrative  . No narrative on file     Review of Systems: General: negative for chills, fever, night sweats or weight changes.  Cardiovascular: negative for chest pain, dyspnea on exertion, edema, orthopnea, palpitations, paroxysmal nocturnal dyspnea or shortness of breath Dermatological: negative for rash Respiratory: negative for cough or wheezing Urologic: negative for hematuria Abdominal: negative for nausea, vomiting, diarrhea, bright red blood per rectum, melena, or hematemesis Neurologic: negative for visual changes, syncope, or dizziness All other systems reviewed and are otherwise negative except as noted above.    Height 5\' 2"  (1.575 m), weight 109 lb (49.442 kg).  General appearance: alert, cooperative and no distress Neck: no JVD and + bilateral bruits Lungs: clear to auscultation bilaterally Heart: irregularly irregular rhythm Extremities: no LEE Pulses: 2+ and symmetric Skin: warm and dry Neurologic: Grossly normal  EKG atrial fibrillation with controlled ventricular response 82 beats per minute  ASSESSMENT AND PLAN:   1. Syncope: Patient with 2 recent  episodes of syncopal spells, one of which may have been related to vasovagal syncope. However she had a second syncopal spell and based on her history it is uncertain to tell whether or not this was the case. She does have a history of prior stroke and has atrial fibrillation, for which she takes Warfarin. She also has known bilateral carotid artery disease which was last assessed via duplex ultrasound over a year ago and noted to have 50-69% stenosis bilaterally. She is also a chronic smoker, smoking on average a pack and a half per day. There is concerned that she may have had progression of disease. I feel that it is best to further evaluate the cause of her syncope with a cardiac event monitor to see if she's having rapid atrial fibrillation, potential bradycardia or any other  cardiac arrhythmias. We'll also order bilateral carotid Dopplers to reassess the degree of her carotid artery disease. She denies chest pain. He is been encouraged to use stool softeners to prevent constipation her that she does not have to strain during bowel movements.  2. Atrial fibrillation: Currently rate controlled in the 80s.  As mentioned above, we'll order a 30 day heart monitor to see how well her rates are controlled and to assess whether or not this may be a potential cause for her syncope. As she has a prior history of stroke, continue warfarin for stroke prophylaxis.  3. chronic oral anticoagulation: INR is followed by her PCP. Luckily, the patient has not sustained any falls. She has been instructed to seek emergent medical evaluation if she suffers any falls particularly any that result in head trauma. If we are unable to identify the cause of her syncope and if she continues to have episodes then we may need to reconsider long-term oral anticoagulation, as she may be at increased risk for falls. However, she also has atrial fibrillation and suffered 2 strokes in the past. We'll need to weigh bleeding risk vs stroke  risk and will need her primary cardiologist, Dr. Gwenlyn Found, to provide recommendations.   PLAN  Plan for cardiac event monitor and bilateral carotid Doppler studies to evaluate causes for syncope. Followup with Dr. Gwenlyn Found after cardiac event monitoring course is complete. She was instructed to go to the ER if she develops any stroke-like symptoms or sustains any falls, especially those that result in head trauma. Both she and her husband verbalized understanding.   Quantay Zaremba, BRITTAINYPA-C 09/19/2013 11:29 AM

## 2013-09-19 NOTE — Telephone Encounter (Signed)
Calling with EKG results

## 2013-09-19 NOTE — Patient Instructions (Signed)
Your physician recommends that you schedule a follow-up appointment in: 6 weeks with Dr berry or with brittainy if Dr Gwenlyn Found do not have availibility, but with Brittainy when Dr Gwenlyn Found is here   Your physician has recommended that you wear an event monitor. Event monitors are medical devices that record the heart's electrical activity. Doctors most often [place] these monitors to diagnose arrhythmias. Arrhythmias are problems with the speed or rhythm of the heartbeat. The monitor is a small, portable device. You can wear one while you do your normal daily activities. This is usually used to diagnose what is causing palpitations/syncope (passing out).  Your physician has requested that you have a carotid duplex. This test is an ultrasound of the carotid arteries in your neck. It looks at blood flow through these arteries that supply the brain with blood. Allow one hour for this exam. There are no restrictions or special instructions.

## 2013-09-19 NOTE — Telephone Encounter (Signed)
noted 

## 2013-09-22 ENCOUNTER — Telehealth: Payer: Self-pay | Admitting: Cardiovascular Disease

## 2013-09-23 NOTE — Telephone Encounter (Signed)
Closed encounter °

## 2013-09-25 ENCOUNTER — Ambulatory Visit (HOSPITAL_COMMUNITY)
Admission: RE | Admit: 2013-09-25 | Discharge: 2013-09-25 | Disposition: A | Discharge status: Home / Self Care | Payer: Medicare Other | Source: Ambulatory Visit | Attending: Cardiology | Admitting: Cardiology

## 2013-09-25 DIAGNOSIS — R55 Syncope and collapse: Secondary | ICD-10-CM

## 2013-09-25 NOTE — Progress Notes (Signed)
Carotid Duplex Completed. °Brianna L Mazza,RVT °

## 2013-09-26 ENCOUNTER — Other Ambulatory Visit: Payer: Self-pay | Admitting: Cardiovascular Disease

## 2013-09-26 NOTE — Telephone Encounter (Signed)
Rx was sent to pharmacy electronically. 

## 2013-10-01 ENCOUNTER — Encounter: Payer: Self-pay | Admitting: *Deleted

## 2013-11-04 ENCOUNTER — Encounter: Payer: Self-pay | Admitting: Cardiovascular Disease

## 2013-11-04 ENCOUNTER — Ambulatory Visit (INDEPENDENT_AMBULATORY_CARE_PROVIDER_SITE_OTHER): Payer: Medicare Other | Admitting: Cardiovascular Disease

## 2013-11-04 VITALS — BP 150/90 | HR 80 | Ht 63.0 in | Wt 108.0 lb

## 2013-11-04 DIAGNOSIS — I482 Chronic atrial fibrillation, unspecified: Secondary | ICD-10-CM

## 2013-11-04 DIAGNOSIS — I4891 Unspecified atrial fibrillation: Secondary | ICD-10-CM

## 2013-11-04 DIAGNOSIS — E785 Hyperlipidemia, unspecified: Secondary | ICD-10-CM

## 2013-11-04 DIAGNOSIS — I10 Essential (primary) hypertension: Secondary | ICD-10-CM

## 2013-11-04 NOTE — Progress Notes (Signed)
11/04/2013 Andrea [name]   [birth year]  [id number]  Primary Physician Glo Herring., MD Primary Cardiologist: Lorretta Harp MD Renae Gloss   HPI:  The patient is a 23 effects-year-old, thin and frail appearing, married, Caucasian female, mother of four, who I last saw in the office six months ago. She has a history of continued tobacco abuse of one and a half packs per day, recalcitrant to risk factor modification. She has chronic A fib, rate controlled on Coumadin anticoagulation, followed by [facility]. She has had a failed TEE cardioversion in the past. Other problems include: Hypertension and hyperlipidemia. She did have a stroke in June 2012 and another stroke apparently in March 2013 in the left MCA distribution leaving her with dysarthria and right-sided paresis.   An echo done during her hospitalization revealed an EF of 45 to 50% with severe left atrial dilatation.  She was seen in the office by Cecilie Kicks registered nurse practitioner 06/11/12 which is pain. She was begun on oral nitrate which improved the frequency of her chest pain. A stress test was suggested but the patient declined when she was seen last. She did have a Persantine Myoview performed in 2008 showed mild apical defect.   She saw Ellen [facility] in the office recently because of several episodes of syncope. Carotid Dopplers showed no significant ICA stenosis and a one-month event monitor showed tachybradycardia syndrome with pauses up to 2.6 seconds pauses as well as HR up to 130.     Current Outpatient Prescriptions  Medication Sig Dispense Refill  . atorvastatin (LIPITOR) 10 MG tablet Take 1 tablet (10 mg total) by mouth at bedtime.  30 tablet  11  . benazepril (LOTENSIN) 5 MG tablet Take 5 mg by mouth daily.      Marland Kitchen CALCIUM-VITAMIN D PO Take 1 tablet by mouth daily.      Marland Kitchen esomeprazole (NEXIUM) 20 MG capsule Take 20 mg by mouth daily before breakfast.      . estazolam (PROSOM)  2 MG tablet Take 2 mg by mouth at bedtime.      . isosorbide mononitrate (IMDUR) 30 MG 24 hr tablet Take 1 tablet (30 mg total) by mouth daily.  30 tablet  11  . metoprolol tartrate (LOPRESSOR) 12.5 mg TABS tablet Take 0.5 tablets (12.5 mg total) by mouth daily.  60 tablet  2  . MYRBETRIQ 25 MG TB24 Take 25 mg by mouth daily.      . niacin 500 MG tablet Take 500 mg by mouth daily.      Marland Kitchen NITROSTAT 0.4 MG SL tablet Place 0.4 mg under the tongue as needed.      Marland Kitchen oxybutynin (DITROPAN-XL) 10 MG 24 hr tablet Take 10 mg by mouth at bedtime.      . prazosin (MINIPRESS) 2 MG capsule Take 2 mg by mouth every evening.       . traMADol-acetaminophen (ULTRACET) 37.5-325 MG per tablet Take 37.5 tablets by mouth 3 (three) times daily.      Marland Kitchen trimethoprim (TRIMPEX) 100 MG tablet       . warfarin (COUMADIN) 3 MG tablet Take 3 mg by mouth as directed. On Saturdays and Sundays only. Take 4mg  on Monday through Friday      . warfarin (COUMADIN) 4 MG tablet Take 4 mg by mouth daily. *take 4mg  on Mondays through Fridays, take 3mg  on Saturday and Sunday*       No current facility-administered medications for this visit.  Allergies  Allergen Reactions  . Codeine   . Contrast Media [Iodinated Diagnostic Agents]     Per pt chart.  . Iodine   . Lovenox [Enoxaparin]   . Nitrofurantoin Other (See Comments)    Numbness in arms and legs  . Nitrofurantoin Monohyd Macro Other (See Comments)  . Penicillins Rash  . Sulfa Antibiotics Rash  . Sulfonamide Derivatives Rash    History   Social History  . Marital Status: Married    Spouse Name: N/A    Number of Children: N/A  . Years of Education: N/A   Occupational History  . Not on file.   Social History Main Topics  . Smoking status: Current Every Day Smoker -- 0.50 packs/day for 40 years    Types: Cigarettes  . Smokeless tobacco: Never Used  . Alcohol Use: No  . Drug Use: No  . Sexual Activity: Not Currently   Other Topics Concern  . Not on file    Social History Narrative  . No narrative on file     Review of Systems: General: negative for chills, fever, night sweats or weight changes.  Cardiovascular: negative for chest pain, dyspnea on exertion, edema, orthopnea, palpitations, paroxysmal nocturnal dyspnea or shortness of breath Dermatological: negative for rash Respiratory: negative for cough or wheezing Urologic: negative for hematuria Abdominal: negative for nausea, vomiting, diarrhea, bright red blood per rectum, melena, or hematemesis Neurologic: negative for visual changes, syncope, or dizziness All other systems reviewed and are otherwise negative except as noted above.    Blood pressure 150/90, pulse 80, height 5\' 3"  (1.6 m), weight 108 lb (48.988 kg).  General appearance: alert and no distress Neck: no adenopathy, no carotid bruit, no JVD, supple, symmetrical, trachea midline and thyroid not enlarged, symmetric, no tenderness/mass/nodules Lungs: clear to auscultation bilaterally Heart: irregularly irregular rhythm Extremities: extremities normal, atraumatic, no cyanosis or edema  EKG not performed today  ASSESSMENT AND PLAN:   Atrial fibrillation Rate controlled on Coumadin anticoagulation. She is on low-dose beta blocker. Recent event monitor showed tachybradycardia syndrome with heart rates in up to 130 to 140 as well as pauses up to 2.6 seconds. She has had several episodes of syncope were witnessed. I am going to refer her to Dr. Sallyanne Kuster for evaluation of Loop recorder implantation  Essential hypertension Controlled on current medications  Hyperlipidemia On statin therapy followed by her PCP      Lorretta Harp MD [facility] 11/04/2013 3:53 PM

## 2013-11-04 NOTE — Patient Instructions (Signed)
We request that you follow-up in: 6 months with an extender (Brittainy)  and in 1 year with Dr Andria Rhein will receive a reminder letter in the mail two months in advance. If you don't receive a letter, please call our office to schedule the follow-up appointment.  Dr Gwenlyn Found has referred you to Dr Sallyanne Kuster for discussion about a loop recorder implant.

## 2013-11-04 NOTE — Assessment & Plan Note (Signed)
Controlled on current medications 

## 2013-11-04 NOTE — Assessment & Plan Note (Addendum)
Rate controlled on Coumadin anticoagulation. She is on low-dose beta blocker. Recent event monitor showed tachybradycardia syndrome with heart rates in up to 130 to 140 as well as pauses up to 2.6 seconds. She has had several episodes of syncope were witnessed. I am going to refer her to Dr. Sallyanne Kuster for evaluation of Loop recorder implantation

## 2013-11-04 NOTE — Assessment & Plan Note (Signed)
On statin therapy followed by her PCP 

## 2013-11-05 ENCOUNTER — Inpatient Hospital Stay (HOSPITAL_COMMUNITY)
Admission: EM | Admit: 2013-11-05 | Discharge: 2013-11-11 | DRG: 312 | Disposition: A | Discharge status: Home / Self Care | Payer: Medicare Other | Attending: Cardiovascular Disease | Admitting: Cardiovascular Disease

## 2013-11-05 ENCOUNTER — Encounter (HOSPITAL_COMMUNITY): Payer: Self-pay | Admitting: Emergency Medicine

## 2013-11-05 ENCOUNTER — Emergency Department (HOSPITAL_COMMUNITY): Payer: Medicare Other

## 2013-11-05 DIAGNOSIS — N390 Urinary tract infection, site not specified: Secondary | ICD-10-CM

## 2013-11-05 DIAGNOSIS — Z7901 Long term (current) use of anticoagulants: Secondary | ICD-10-CM

## 2013-11-05 DIAGNOSIS — I951 Orthostatic hypotension: Secondary | ICD-10-CM | POA: Clinically undetermined

## 2013-11-05 DIAGNOSIS — R0789 Other chest pain: Secondary | ICD-10-CM

## 2013-11-05 DIAGNOSIS — B029 Zoster without complications: Secondary | ICD-10-CM | POA: Diagnosis present

## 2013-11-05 DIAGNOSIS — I455 Other specified heart block: Secondary | ICD-10-CM

## 2013-11-05 DIAGNOSIS — Z720 Tobacco use: Secondary | ICD-10-CM | POA: Diagnosis present

## 2013-11-05 DIAGNOSIS — F17200 Nicotine dependence, unspecified, uncomplicated: Secondary | ICD-10-CM | POA: Diagnosis present

## 2013-11-05 DIAGNOSIS — I4891 Unspecified atrial fibrillation: Secondary | ICD-10-CM

## 2013-11-05 DIAGNOSIS — Z8673 Personal history of transient ischemic attack (TIA), and cerebral infarction without residual deficits: Secondary | ICD-10-CM

## 2013-11-05 DIAGNOSIS — E876 Hypokalemia: Secondary | ICD-10-CM

## 2013-11-05 DIAGNOSIS — I672 Cerebral atherosclerosis: Secondary | ICD-10-CM | POA: Diagnosis present

## 2013-11-05 DIAGNOSIS — R791 Abnormal coagulation profile: Secondary | ICD-10-CM

## 2013-11-05 DIAGNOSIS — I9589 Other hypotension: Secondary | ICD-10-CM

## 2013-11-05 DIAGNOSIS — R21 Rash and other nonspecific skin eruption: Secondary | ICD-10-CM | POA: Diagnosis present

## 2013-11-05 DIAGNOSIS — E785 Hyperlipidemia, unspecified: Secondary | ICD-10-CM | POA: Diagnosis present

## 2013-11-05 DIAGNOSIS — R4701 Aphasia: Secondary | ICD-10-CM | POA: Diagnosis present

## 2013-11-05 DIAGNOSIS — I429 Cardiomyopathy, unspecified: Secondary | ICD-10-CM

## 2013-11-05 DIAGNOSIS — F0150 Vascular dementia without behavioral disturbance: Secondary | ICD-10-CM | POA: Diagnosis present

## 2013-11-05 DIAGNOSIS — I495 Sick sinus syndrome: Secondary | ICD-10-CM | POA: Diagnosis present

## 2013-11-05 DIAGNOSIS — R55 Syncope and collapse: Principal | ICD-10-CM

## 2013-11-05 DIAGNOSIS — I10 Essential (primary) hypertension: Secondary | ICD-10-CM

## 2013-11-05 DIAGNOSIS — I482 Chronic atrial fibrillation, unspecified: Secondary | ICD-10-CM

## 2013-11-05 HISTORY — DX: Orthostatic hypotension: I95.1

## 2013-11-05 HISTORY — DX: Long term (current) use of anticoagulants: Z79.01

## 2013-11-05 LAB — COMPREHENSIVE METABOLIC PANEL
ALT: 12 U/L (ref 0–35)
AST: 22 U/L (ref 0–37)
Albumin: 3.4 g/dL — ABNORMAL LOW (ref 3.5–5.2)
Alkaline Phosphatase: 63 U/L (ref 39–117)
Anion gap: 14 (ref 5–15)
BILIRUBIN TOTAL: 0.8 mg/dL (ref 0.3–1.2)
BUN: 9 mg/dL (ref 6–23)
CHLORIDE: 101 meq/L (ref 96–112)
CO2: 26 meq/L (ref 19–32)
CREATININE: 0.66 mg/dL (ref 0.50–1.10)
Calcium: 8.7 mg/dL (ref 8.4–10.5)
GFR calc Af Amer: >90 mL/min (ref >90)
GFR, EST NON AFRICAN AMERICAN: 84 mL/min — AB (ref >90)
Glucose, Bld: 191 mg/dL — ABNORMAL HIGH (ref 70–99)
Potassium: 3.2 mEq/L — ABNORMAL LOW (ref 3.7–5.3)
Sodium: 141 mEq/L (ref 137–147)
Total Protein: 6.6 g/dL (ref 6.0–8.3)

## 2013-11-05 LAB — URINALYSIS, ROUTINE W REFLEX MICROSCOPIC
BILIRUBIN URINE: NEGATIVE
Glucose, UA: NEGATIVE mg/dL
HGB URINE DIPSTICK: NEGATIVE
Ketones, ur: NEGATIVE mg/dL
Nitrite: NEGATIVE
Protein, ur: 30 mg/dL — AB
Specific Gravity, Urine: 1.015 (ref 1.005–1.030)
UROBILINOGEN UA: 1 mg/dL (ref 0.0–1.0)
pH: 8 (ref 5.0–8.0)

## 2013-11-05 LAB — URINE MICROSCOPIC-ADD ON

## 2013-11-05 LAB — TROPONIN I: Troponin I: <0.3 ng/mL (ref <0.30)

## 2013-11-05 LAB — CBC WITH DIFFERENTIAL/PLATELET
BASOS ABS: 0 10*3/uL (ref 0.0–0.1)
Basophils Relative: 0 % (ref 0–1)
Eosinophils Absolute: 0 10*3/uL (ref 0.0–0.7)
Eosinophils Relative: 1 % (ref 0–5)
HEMATOCRIT: 40.8 % (ref 36.0–46.0)
HEMOGLOBIN: 13.7 g/dL (ref 12.0–15.0)
LYMPHS ABS: 1.1 10*3/uL (ref 0.7–4.0)
LYMPHS PCT: 21 % (ref 12–46)
MCH: 32.8 pg (ref 26.0–34.0)
MCHC: 33.6 g/dL (ref 30.0–36.0)
MCV: 97.6 fL (ref 78.0–100.0)
MONO ABS: 0.4 10*3/uL (ref 0.1–1.0)
Monocytes Relative: 8 % (ref 3–12)
NEUTROS ABS: 3.6 10*3/uL (ref 1.7–7.7)
Neutrophils Relative %: 70 % (ref 43–77)
Platelets: 138 10*3/uL — ABNORMAL LOW (ref 150–400)
RBC: 4.18 MIL/uL (ref 3.87–5.11)
RDW: 13.5 % (ref 11.5–15.5)
WBC: 5.1 10*3/uL (ref 4.0–10.5)

## 2013-11-05 LAB — PROTIME-INR
INR: 3.19 — AB (ref 0.00–1.49)
Prothrombin Time: 32.7 seconds — ABNORMAL HIGH (ref 11.6–15.2)

## 2013-11-05 LAB — LACTIC ACID, PLASMA: Lactic Acid, Venous: 1.5 mmol/L (ref 0.5–2.2)

## 2013-11-05 LAB — PRO B NATRIURETIC PEPTIDE: Pro B Natriuretic peptide (BNP): 5769 pg/mL — ABNORMAL HIGH (ref 0–450)

## 2013-11-05 MED ORDER — ISOSORBIDE MONONITRATE ER 30 MG PO TB24
30.0000 mg | ORAL_TABLET | Freq: Every day | ORAL | Status: DC
  Administered 2013-11-06 – 2013-11-10 (×5): 30 mg via ORAL
  Filled 2013-11-05 (×8): qty 1

## 2013-11-05 MED ORDER — ONDANSETRON HCL 4 MG PO TABS: 4.0000 mg | ORAL_TABLET | Freq: Four times a day (QID) | ORAL | Status: DC | PRN

## 2013-11-05 MED ORDER — PRAZOSIN HCL 1 MG PO CAPS
2.0000 mg | ORAL_CAPSULE | Freq: Every evening | ORAL | Status: DC
  Administered 2013-11-06 – 2013-11-10 (×3): 2 mg via ORAL
  Filled 2013-11-05 (×4): qty 2
  Filled 2013-11-05: qty 1
  Filled 2013-11-05 (×3): qty 2
  Filled 2013-11-05: qty 1

## 2013-11-05 MED ORDER — CIPROFLOXACIN IN D5W 400 MG/200ML IV SOLN: 400.0000 mg | Freq: Two times a day (BID) | INTRAVENOUS | Status: DC

## 2013-11-05 MED ORDER — DOCUSATE SODIUM 100 MG PO CAPS
100.0000 mg | ORAL_CAPSULE | Freq: Two times a day (BID) | ORAL | Status: DC
  Administered 2013-11-06 – 2013-11-11 (×11): 100 mg via ORAL
  Filled 2013-11-05 (×17): qty 1

## 2013-11-05 MED ORDER — SODIUM CHLORIDE 0.9 % IV SOLN
INTRAVENOUS | Status: DC
  Administered 2013-11-05 – 2013-11-09 (×7): via INTRAVENOUS

## 2013-11-05 MED ORDER — SODIUM CHLORIDE 0.9 % IV SOLN: INTRAVENOUS | Status: DC

## 2013-11-05 MED ORDER — WARFARIN - PHYSICIAN DOSING INPATIENT: Freq: Every day | Status: DC

## 2013-11-05 MED ORDER — WARFARIN SODIUM 2 MG PO TABS
4.0000 mg | ORAL_TABLET | Freq: Every evening | ORAL | Status: DC
  Administered 2013-11-05: 4 mg via ORAL
  Filled 2013-11-05 (×3): qty 2

## 2013-11-05 MED ORDER — OXYBUTYNIN CHLORIDE ER 10 MG PO TB24
10.0000 mg | ORAL_TABLET | Freq: Every day | ORAL | Status: DC
  Administered 2013-11-07 – 2013-11-11 (×5): 10 mg via ORAL
  Filled 2013-11-05: qty 1
  Filled 2013-11-05 (×2): qty 2
  Filled 2013-11-05 (×6): qty 1

## 2013-11-05 MED ORDER — PRAZOSIN HCL 1 MG PO CAPS
ORAL_CAPSULE | ORAL | Status: AC
  Filled 2013-11-05: qty 2

## 2013-11-05 MED ORDER — METOPROLOL TARTRATE 25 MG PO TABS: 12.5000 mg | ORAL_TABLET | Freq: Every day | ORAL | Status: DC

## 2013-11-05 MED ORDER — WARFARIN SODIUM 2 MG PO TABS
ORAL_TABLET | ORAL | Status: AC
  Filled 2013-11-05: qty 2

## 2013-11-05 MED ORDER — LEVOFLOXACIN IN D5W 750 MG/150ML IV SOLN: 750.0000 mg | Freq: Once | INTRAVENOUS | Status: DC

## 2013-11-05 MED ORDER — ESTAZOLAM 2 MG PO TABS: 2.0000 mg | ORAL_TABLET | Freq: Every day | ORAL | Status: DC

## 2013-11-05 MED ORDER — VALACYCLOVIR HCL 500 MG PO TABS
1000.0000 mg | ORAL_TABLET | Freq: Every day | ORAL | Status: DC
  Administered 2013-11-05 – 2013-11-07 (×3): 1000 mg via ORAL
  Filled 2013-11-05 (×6): qty 2

## 2013-11-05 MED ORDER — DEXTROSE 5 % IV SOLN
1.0000 g | Freq: Three times a day (TID) | INTRAVENOUS | Status: DC
  Administered 2013-11-06 (×2): 1 g via INTRAVENOUS
  Filled 2013-11-05 (×2): qty 1

## 2013-11-05 MED ORDER — VANCOMYCIN HCL IN DEXTROSE 750-5 MG/150ML-% IV SOLN: 750.0000 mg | INTRAVENOUS | Status: DC

## 2013-11-05 MED ORDER — DEXTROSE 5 % IV SOLN
1.0000 g | Freq: Three times a day (TID) | INTRAVENOUS | Status: DC
  Filled 2013-11-05 (×7): qty 1

## 2013-11-05 MED ORDER — TRAMADOL-ACETAMINOPHEN 37.5-325 MG PO TABS: 37.5000 | ORAL_TABLET | Freq: Every day | ORAL | Status: DC | PRN

## 2013-11-05 MED ORDER — OXYBUTYNIN CHLORIDE ER 5 MG PO TB24
ORAL_TABLET | ORAL | Status: AC
  Filled 2013-11-05: qty 2

## 2013-11-05 MED ORDER — MIRABEGRON ER 25 MG PO TB24
25.0000 mg | ORAL_TABLET | Freq: Every day | ORAL | Status: DC
  Administered 2013-11-06 – 2013-11-11 (×6): 25 mg via ORAL
  Filled 2013-11-05 (×10): qty 1

## 2013-11-05 MED ORDER — LEVOFLOXACIN IN D5W 750 MG/150ML IV SOLN
750.0000 mg | INTRAVENOUS | Status: DC
  Administered 2013-11-05: 750 mg via INTRAVENOUS
  Filled 2013-11-05: qty 150

## 2013-11-05 MED ORDER — ONDANSETRON HCL 4 MG/2ML IJ SOLN
4.0000 mg | Freq: Four times a day (QID) | INTRAMUSCULAR | Status: DC | PRN
  Administered 2013-11-09: 4 mg via INTRAVENOUS
  Filled 2013-11-05: qty 2

## 2013-11-05 MED ORDER — CIPROFLOXACIN IN D5W 400 MG/200ML IV SOLN
400.0000 mg | Freq: Two times a day (BID) | INTRAVENOUS | Status: DC
  Administered 2013-11-05 – 2013-11-07 (×4): 400 mg via INTRAVENOUS
  Filled 2013-11-05 (×4): qty 200

## 2013-11-05 MED ORDER — SODIUM CHLORIDE 0.9 % IV BOLUS (SEPSIS)
500.0000 mL | Freq: Once | INTRAVENOUS | Status: AC
  Administered 2013-11-05: 500 mL via INTRAVENOUS

## 2013-11-05 MED ORDER — BENAZEPRIL HCL 5 MG PO TABS
5.0000 mg | ORAL_TABLET | Freq: Every day | ORAL | Status: DC
  Administered 2013-11-06 – 2013-11-10 (×5): 5 mg via ORAL
  Filled 2013-11-05 (×8): qty 1

## 2013-11-05 MED ORDER — PANTOPRAZOLE SODIUM 40 MG PO TBEC: 40.0000 mg | DELAYED_RELEASE_TABLET | Freq: Every day | ORAL | Status: DC

## 2013-11-05 MED ORDER — METOPROLOL TARTRATE 12.5 MG HALF TABLET
12.5000 mg | ORAL_TABLET | Freq: Every day | ORAL | Status: DC
  Administered 2013-11-06 – 2013-11-11 (×6): 12.5 mg via ORAL
  Filled 2013-11-05 (×6): qty 1

## 2013-11-05 MED ORDER — SODIUM CHLORIDE 0.9 % IJ SOLN
3.0000 mL | Freq: Two times a day (BID) | INTRAMUSCULAR | Status: DC
  Administered 2013-11-05 – 2013-11-11 (×9): 3 mL via INTRAVENOUS

## 2013-11-05 MED ORDER — DEXTROSE 5 % IV SOLN
2.0000 g | Freq: Once | INTRAVENOUS | Status: AC
  Administered 2013-11-05: 2 g via INTRAVENOUS
  Filled 2013-11-05: qty 2

## 2013-11-05 MED ORDER — DEXTROSE 5 % IV SOLN: 2.0000 g | Freq: Once | INTRAVENOUS | Status: DC

## 2013-11-05 MED ORDER — NITROGLYCERIN 0.4 MG SL SUBL
0.4000 mg | SUBLINGUAL_TABLET | SUBLINGUAL | Status: DC | PRN
  Administered 2013-11-10 (×3): 0.4 mg via SUBLINGUAL
  Filled 2013-11-05: qty 1

## 2013-11-05 MED ORDER — POTASSIUM CHLORIDE CRYS ER 20 MEQ PO TBCR
40.0000 meq | EXTENDED_RELEASE_TABLET | Freq: Once | ORAL | Status: AC
  Administered 2013-11-05: 40 meq via ORAL
  Filled 2013-11-05: qty 2

## 2013-11-05 MED ORDER — TRAMADOL-ACETAMINOPHEN 37.5-325 MG PO TABS
1.0000 | ORAL_TABLET | Freq: Three times a day (TID) | ORAL | Status: DC | PRN
  Administered 2013-11-06: 1 via ORAL
  Filled 2013-11-05 (×2): qty 1

## 2013-11-05 MED ORDER — DEXTROSE 5 % IV SOLN
INTRAVENOUS | Status: AC
  Filled 2013-11-05: qty 1

## 2013-11-05 MED ORDER — PANTOPRAZOLE SODIUM 40 MG PO TBEC
40.0000 mg | DELAYED_RELEASE_TABLET | Freq: Every day | ORAL | Status: DC
  Administered 2013-11-06 – 2013-11-11 (×6): 40 mg via ORAL
  Filled 2013-11-05 (×6): qty 1

## 2013-11-05 MED ORDER — VANCOMYCIN HCL IN DEXTROSE 1-5 GM/200ML-% IV SOLN
1000.0000 mg | Freq: Once | INTRAVENOUS | Status: AC
  Administered 2013-11-05: 1000 mg via INTRAVENOUS
  Filled 2013-11-05: qty 200

## 2013-11-05 MED ORDER — ATORVASTATIN CALCIUM 10 MG PO TABS
10.0000 mg | ORAL_TABLET | Freq: Every day | ORAL | Status: DC
  Administered 2013-11-05 – 2013-11-11 (×6): 10 mg via ORAL
  Filled 2013-11-05 (×9): qty 1

## 2013-11-05 MED ORDER — NIACIN 500 MG PO TABS
500.0000 mg | ORAL_TABLET | Freq: Every day | ORAL | Status: DC
  Administered 2013-11-06 – 2013-11-11 (×6): 500 mg via ORAL
  Filled 2013-11-05 (×11): qty 1

## 2013-11-05 MED ORDER — ZOLPIDEM TARTRATE 5 MG PO TABS
5.0000 mg | ORAL_TABLET | Freq: Every evening | ORAL | Status: DC | PRN
  Administered 2013-11-05: 5 mg via ORAL
  Filled 2013-11-05: qty 1

## 2013-11-05 NOTE — ED Notes (Signed)
Report given to [facility], Dept [address]. All questions answered.

## 2013-11-05 NOTE — ED Notes (Signed)
Per EMS, husband called them due to spouse passing out several times today. Pt has history

## 2013-11-05 NOTE — H&P (Signed)
[facility] History and Physical  Andrea [name] DTO:[id number] DOB: [birth year] DOA: 11/05/2013  Referring physician: ER PCP: Glo Herring., MD   Chief Complaint: Syncope.  HPI: Andrea [name] is a 78 y.o. female  This is a 78 year old lady who presents with a syncopal episode approximately 4 hours ago. She apparently lost consciousness for 15 minutes. EMS was called and patient lost consciousness for a second time whilst on the toilet. According to the husband, who gives most of the history, the patient has had syncopal episodes like this when she has been constipated and needs to open her bowels. Also was noted pain around the left ear with a rash, which was noticed 4 days ago. The patient has had a history of a major stroke several years ago with several TIAs in between and appears to have vascular dementia. She denies any chest pain, dyspnea or palpitations .She has a history of atrial fibrillation.   Review of Systems:  Constitutional:  No weight loss, night sweats, Fevers, chills, fatigue.  HEENT:  No headaches, Difficulty swallowing,Tooth/dental problems,Sore throat,  No sneezing, itching, ear ache, nasal congestion, post nasal drip,   GI:  No heartburn, indigestion, abdominal pain, nausea, vomiting, diarrhea, change in bowel habits, loss of appetite  Resp:  No shortness of breath with exertion or at rest. No excess mucus, no productive cough, No non-productive cough, No coughing up of blood.No change in color of mucus.No wheezing.No chest wall deformity  Skin:  no rash or lesions.  GU:  no dysuria, change in color of urine, no urgency or frequency. No flank pain.  Musculoskeletal:  No joint pain or swelling. No decreased range of motion. No back pain.  Psych:  No change in mood or affect. No depression or anxiety. No memory loss.   Past Medical History  Diagnosis Date  . Hypertension   . Stroke   . Acid reflux   . Atrial fibrillation    tachybradycardia syndrome  . Cancer     facial  . Tobacco abuse 04/30/2011  . Cardiomyopathy 05/02/2011    EF 45-50%, per Echo.  . 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 Carotid artery stenosis 05/02/2011  . CAD (coronary artery disease) 07/20/2006    R/S MV - mild perfusion defect in apical region consistent w/ infarct/scar vs apical thinning; no scintigraphic evidence of inducible myocardial ischemia; baseline EKG demonstrates A Flutter w/ variable AV conduction  . Hyperlipidemia   . Chest pain   . Cerebral atherosclerosis 12/11/2011    Carotid duplex - R ICA 0-49% diameter reduction, velocities suggest high end; L ICA 0-49% reduction, velocities suggest mid range  . Neuropathy   . Syncope    Past Surgical History  Procedure Laterality Date  . Cholecystectomy    . Skin cancer excision    . Tonsillectomy    . Tee with cardioversion      failed in past but dates not given   Social History:  reports that she has been smoking Cigarettes.  She has a 20 pack-year smoking history. She has never used smokeless tobacco. She reports that she does not drink alcohol or use illicit drugs.  Allergies  Allergen Reactions  . Codeine   . Contrast Media [Iodinated Diagnostic Agents]     Per pt chart.  . Iodine   . Lovenox [Enoxaparin]   . Nitrofurantoin Other (See Comments)    Numbness in arms and legs  . Nitrofurantoin Monohyd Macro Other (See Comments)  . Penicillins  Rash  . Sulfa Antibiotics Rash  . Sulfonamide Derivatives Rash    Family History  Problem Relation Age of Onset  . Adopted: Yes     Prior to Admission medications   Medication Sig Start Date End Date Taking? Authorizing Provider  atorvastatin (LIPITOR) 10 MG tablet Take 1 tablet (10 mg total) by mouth at bedtime. 05/16/13  Yes Lorretta Harp, MD  benazepril (LOTENSIN) 5 MG tablet Take 5 mg by mouth daily.   Yes Historical Provider, MD  CALCIUM-VITAMIN D PO Take 1 tablet by mouth daily.   Yes Historical Provider,  MD  esomeprazole (NEXIUM) 20 MG capsule Take 20 mg by mouth daily before breakfast.   Yes Historical Provider, MD  estazolam (PROSOM) 2 MG tablet Take 2 mg by mouth at bedtime.   Yes Historical Provider, MD  isosorbide mononitrate (IMDUR) 30 MG 24 hr tablet Take 1 tablet (30 mg total) by mouth daily. 09/26/13  Yes Lorretta Harp, MD  metoprolol tartrate (LOPRESSOR) 25 MG tablet Take 12.5 mg by mouth daily.   Yes Historical Provider, MD  MYRBETRIQ 25 MG TB24 Take 25 mg by mouth daily. 06/27/12  Yes Historical Provider, MD  niacin 500 MG tablet Take 500 mg by mouth daily.   Yes Historical Provider, MD  oxybutynin (DITROPAN-XL) 10 MG 24 hr tablet Take 10 mg by mouth at bedtime.   Yes Historical Provider, MD  Potassium 99 MG TABS Take 1 tablet by mouth daily.   Yes Historical Provider, MD  prazosin (MINIPRESS) 2 MG capsule Take 2 mg by mouth every evening.    Yes Historical Provider, MD  traMADol-acetaminophen (ULTRACET) 37.5-325 MG per tablet Take 37.5 tablets by mouth daily as needed for moderate pain or severe pain (*May take two tablets three times daily for hip pain).  11/03/13  Yes Historical Provider, MD  trimethoprim (TRIMPEX) 100 MG tablet Take 100 mg by mouth daily.  01/07/13  Yes Historical Provider, MD  warfarin (COUMADIN) 4 MG tablet Take 4 mg by mouth every evening.  05/02/11  Yes Rexene Alberts, MD  NITROSTAT 0.4 MG SL tablet Place 0.4 mg under the tongue every 5 (five) minutes as needed for chest pain.  06/12/12   Historical Provider, MD   Physical Exam: Filed Vitals:   11/05/13 1930 11/05/13 2000 11/05/13 2030 11/05/13 2046  BP: 107/55 115/81 129/83 129/83  Pulse: 84 81 70 63  Temp:      TempSrc:      Resp: 17 20 18 18   Weight:      SpO2: 100% 100% 100% 96%    Wt Readings from Last 3 Encounters:  11/05/13 48.6 kg (107 lb 2.3 oz)  11/04/13 48.988 kg (108 lb)  09/19/13 49.442 kg (109 lb)    General:  Appears calm and comfortable Eyes: PERRL, normal lids, irises &  conjunctiva ENT: grossly normal hearing, lips & tongue Neck: no LAD, masses or thyromegaly Cardiovascular: Irregular, consistent with atrial fibrillation Telemetry: Atrial fibrillation. Respiratory: CTA bilaterally, no w/r/r. Normal respiratory effort. Abdomen: soft, ntnd Skin: no rash or induration seen on limited exam Musculoskeletal: grossly normal tone BUE/BLE Psychiatric: grossly normal mood and affect, speech fluent and appropriate Neurologic: grossly non-focal.          Labs on Admission:  Basic Metabolic Panel:  Recent Labs Lab 11/05/13 1455  NA 141  K 3.2*  CL 101  CO2 26  GLUCOSE 191*  BUN 9  CREATININE 0.66  CALCIUM 8.7   Liver Function Tests:  Recent  Labs Lab 11/05/13 1455  AST 22  ALT 12  ALKPHOS 63  BILITOT 0.8  PROT 6.6  ALBUMIN 3.4*   No results found for this basename: LIPASE, AMYLASE,  in the last 168 hours No results found for this basename: AMMONIA,  in the last 168 hours CBC:  Recent Labs Lab 11/05/13 1455  WBC 5.1  NEUTROABS 3.6  HGB 13.7  HCT 40.8  MCV 97.6  PLT 138*   Cardiac Enzymes:  Recent Labs Lab 11/05/13 1455  TROPONINI <0.30    BNP (last 3 results)  Recent Labs  11/05/13 1717  PROBNP 5769.0*   CBG: No results found for this basename: GLUCAP,  in the last 168 hours  Radiological Exams on Admission: Ct Head Wo Contrast  11/05/2013   CLINICAL DATA:  Altered mental status, loss of consciousness  EXAM: CT HEAD WITHOUT CONTRAST  TECHNIQUE: Contiguous axial images were obtained from the base of the skull through the vertex without intravenous contrast.  COMPARISON:  10/04/2012  FINDINGS: Mild left parietal encephalomalacia is stable. Mild cortical volume loss noted with proportional ventricular prominence. Areas of periventricular white matter hypodensity are most compatible with small vessel ischemic change. No acute hemorrhage, infarct, or mass lesion is identified. No midline shift. Orbits and paranasal sinuses are  within normal limits.  IMPRESSION: No acute intracranial finding.  Chronic stable findings as above.   Electronically Signed   By: Conchita Paris M.D.   On: 11/05/2013 17:58   Dg Chest Port 1 View  11/05/2013   CLINICAL DATA:  Weakness and loss of consciousness.  EXAM: PORTABLE CHEST - 1 VIEW  COMPARISON:  10/04/2012  FINDINGS: There is stable elevation of the left hemidiaphragm with left lower lobe atelectasis. There is no evidence of pulmonary edema, consolidation, pneumothorax, nodule or pleural fluid. The heart size and mediastinal contours are stable.  IMPRESSION: Stable elevation of left hemidiaphragm with left lower lobe atelectasis.   Electronically Signed   By: Aletta Edouard M.D.   On: 11/05/2013 17:39    EKG: Independently reviewed. Atrial fibrillation without any acute ST-T wave elevation. Ventricular rate is controlled.  Assessment/Plan   1. Syncope, appears to be vasovagal in nature. 2. UTI. 3. Hypertension. 4. Vascular dementia. 5. Chronic atrial fibrillation on anticoagulation therapy.  Plan: 1. Admit to telemetry. 2. Serial cardiac enzymes. Check TSH. 3. Cardiology consultation. 4. Antibiotics for UTI.  Further recommendations will depend on patient's hospital progress.  Code Status: Full code.  DVT Prophylaxis: Warfarin.  Family Communication: I discussed the plan with the patient's husband at the bedside.   Disposition Plan: Home when medically stable.   Time spent: 60 minutes.  Doree Albee [facility] Pager [phone].

## 2013-11-05 NOTE — ED Provider Notes (Addendum)
CSN: [id number]     Arrival date & time 11/05/13  1432 History  This chart was scribed for Fredia Sorrow, MD by Rayfield Citizen, ED Scribe. This patient was seen in room APA17/APA17 and the patient's care was started at 7:18 PM.      Chief Complaint  Patient presents with  . Loss of Consciousness   Level 5 Caveat   The history is provided by the spouse. No language interpreter was used.    HPI Comments: Andrea [name] is a 78 y.o. female who presents to the Emergency Department complaining of LOC as of two hours ago. Husband states patient was in her wheelchair when she passed out for about fifteen minutes. EMS was called, and the patient lost consciousness for the second time while on the toilet. Husband denies any recent trauma, injury or fall.  He notes pain around the patient's left ear, originally noticed 4 days PTA. Husband explains a major stroke several years ago with several TIAs; loss of memory noted. Husband denies fever. Husband denies nausea or vomiting.  Husband notes they visited her cardiologist yesterday; patient takes Coumadin for a-fib.    Past Medical History  Diagnosis Date  . Hypertension   . Stroke   . Acid reflux   . Atrial fibrillation     tachybradycardia syndrome  . Cancer     facial  . Tobacco abuse 04/30/2011  . Cardiomyopathy 05/02/2011    EF 45-50%, per Echo.  . 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 Carotid artery stenosis 05/02/2011  . CAD (coronary artery disease) 07/20/2006    R/S MV - mild perfusion defect in apical region consistent w/ infarct/scar vs apical thinning; no scintigraphic evidence of inducible myocardial ischemia; baseline EKG demonstrates A Flutter w/ variable AV conduction  . Hyperlipidemia   . Chest pain   . Cerebral atherosclerosis 12/11/2011    Carotid duplex - R ICA 0-49% diameter reduction, velocities suggest high end; L ICA 0-49% reduction, velocities suggest mid range  . Neuropathy   . Syncope    Past Surgical  History  Procedure Laterality Date  . Cholecystectomy    . Skin cancer excision    . Tonsillectomy    . Tee with cardioversion      failed in past but dates not given   Family History  Problem Relation Age of Onset  . Adopted: Yes   History  Substance Use Topics  . Smoking status: Current Every Day Smoker -- 0.50 packs/day for 40 years    Types: Cigarettes  . Smokeless tobacco: Never Used  . Alcohol Use: No   OB History   Grav Para Term Preterm Abortions TAB SAB Ect Mult Living                 Review of Systems  Unable to perform ROS: Acuity of condition      Allergies  Codeine; Contrast media; Iodine; Lovenox; Nitrofurantoin; Nitrofurantoin monohyd macro; Penicillins; Sulfa antibiotics; and Sulfonamide derivatives  Home Medications   Prior to Admission medications   Medication Sig Start Date End Date Taking? Authorizing Provider  atorvastatin (LIPITOR) 10 MG tablet Take 1 tablet (10 mg total) by mouth at bedtime. 05/16/13   Lorretta Harp, MD  benazepril (LOTENSIN) 5 MG tablet Take 5 mg by mouth daily.    Historical Provider, MD  CALCIUM-VITAMIN D PO Take 1 tablet by mouth daily.    Historical Provider, MD  esomeprazole (NEXIUM) 20 MG capsule Take 20 mg by  mouth daily before breakfast.    Historical Provider, MD  estazolam (PROSOM) 2 MG tablet Take 2 mg by mouth at bedtime.    Historical Provider, MD  HYDROcodone-acetaminophen (NORCO/VICODIN) 5-325 MG per tablet  09/02/13   Historical Provider, MD  isosorbide mononitrate (IMDUR) 30 MG 24 hr tablet Take 1 tablet (30 mg total) by mouth daily. 09/26/13   Lorretta Harp, MD  metoprolol tartrate (LOPRESSOR) 12.5 mg TABS tablet Take 0.5 tablets (12.5 mg total) by mouth daily. 10/05/12   Reyne Dumas, MD  MYRBETRIQ 25 MG TB24 Take 25 mg by mouth daily. 06/27/12   Historical Provider, MD  niacin 500 MG tablet Take 500 mg by mouth daily.    Historical Provider, MD  NITROSTAT 0.4 MG SL tablet Place 0.4 mg under the tongue as  needed. 06/12/12   Historical Provider, MD  oxybutynin (DITROPAN-XL) 10 MG 24 hr tablet Take 10 mg by mouth at bedtime.    Historical Provider, MD  prazosin (MINIPRESS) 2 MG capsule Take 2 mg by mouth every evening.     Historical Provider, MD  traMADol-acetaminophen (ULTRACET) 37.5-325 MG per tablet Take 37.5 tablets by mouth 3 (three) times daily. 11/03/13   Historical Provider, MD  trimethoprim (TRIMPEX) 100 MG tablet  01/07/13   Historical Provider, MD  warfarin (COUMADIN) 3 MG tablet Take 3 mg by mouth as directed. On Saturdays and Sundays only. Take 4mg  on Monday through Friday    Historical Provider, MD  warfarin (COUMADIN) 4 MG tablet Take 4 mg by mouth daily. *take 4mg  on Mondays through Fridays, take 3mg  on Saturday and Sunday* 05/02/11   Rexene Alberts, MD   BP 125/76  Pulse 73  Temp(Src) 97.8 F (36.6 C) (Oral)  Resp 20  Wt 107 lb 2.3 oz (48.6 kg)  SpO2 100% Physical Exam  Nursing note and vitals reviewed. Constitutional: She is oriented to person, place, and time. She appears well-developed and well-nourished. No distress.  HENT:  Head: Normocephalic and atraumatic.  Mouth/Throat: Oropharynx is clear and moist. No oropharyngeal exudate.  Left ear canal somewhat colluded with wax; can't visualize TM  Eyes: Pupils are equal, round, and reactive to light.  Neck: Neck supple.  Cardiovascular: Normal rate and regular rhythm.   Slight tremor noted  Pulmonary/Chest: Effort normal and breath sounds normal. No respiratory distress. She has no wheezes. She has no rales. She exhibits no tenderness.  Abdominal: Soft. Bowel sounds are normal. She exhibits no distension.  Musculoskeletal: She exhibits no edema.  Neurological: She is alert and oriented to person, place, and time. No cranial nerve deficit.  Skin: Skin is warm and dry. No rash noted.  Vesicles behind & right in front of the left ear are red and irritated; potential rash  Psychiatric: She has a normal mood and affect. Her  behavior is normal.    ED Course  Procedures (including critical care time) Labs Review Labs Reviewed  CBC WITH DIFFERENTIAL - Abnormal; Notable for the following:    Platelets 138 (*)    All other components within normal limits  COMPREHENSIVE METABOLIC PANEL - Abnormal; Notable for the following:    Potassium 3.2 (*)    Glucose, Bld 191 (*)    Albumin 3.4 (*)    GFR calc non Af Amer 84 (*)    All other components within normal limits  URINALYSIS, ROUTINE W REFLEX MICROSCOPIC - Abnormal; Notable for the following:    Protein, ur 30 (*)    Leukocytes, UA TRACE (*)  All other components within normal limits  PROTIME-INR - Abnormal; Notable for the following:    Prothrombin Time 32.7 (*)    INR 3.19 (*)    All other components within normal limits  PRO B NATRIURETIC PEPTIDE - Abnormal; Notable for the following:    Pro B Natriuretic peptide (BNP) 5769.0 (*)    All other components within normal limits  URINE MICROSCOPIC-ADD ON - Abnormal; Notable for the following:    Squamous Epithelial / LPF MANY (*)    Bacteria, UA MANY (*)    All other components within normal limits  CULTURE, BLOOD (ROUTINE X 2)  CULTURE, BLOOD (ROUTINE X 2)  URINE CULTURE  TROPONIN I  LACTIC ACID, PLASMA    Imaging Review Ct Head Wo Contrast  11/05/2013   CLINICAL DATA:  Altered mental status, loss of consciousness  EXAM: CT HEAD WITHOUT CONTRAST  TECHNIQUE: Contiguous axial images were obtained from the base of the skull through the vertex without intravenous contrast.  COMPARISON:  10/04/2012  FINDINGS: Mild left parietal encephalomalacia is stable. Mild cortical volume loss noted with proportional ventricular prominence. Areas of periventricular white matter hypodensity are most compatible with small vessel ischemic change. No acute hemorrhage, infarct, or mass lesion is identified. No midline shift. Orbits and paranasal sinuses are within normal limits.  IMPRESSION: No acute intracranial finding.   Chronic stable findings as above.   Electronically Signed   By: Conchita Paris M.D.   On: 11/05/2013 17:58   Dg Chest Port 1 View  11/05/2013   CLINICAL DATA:  Weakness and loss of consciousness.  EXAM: PORTABLE CHEST - 1 VIEW  COMPARISON:  10/04/2012  FINDINGS: There is stable elevation of the left hemidiaphragm with left lower lobe atelectasis. There is no evidence of pulmonary edema, consolidation, pneumothorax, nodule or pleural fluid. The heart size and mediastinal contours are stable.  IMPRESSION: Stable elevation of left hemidiaphragm with left lower lobe atelectasis.   Electronically Signed   By: Aletta Edouard M.D.   On: 11/05/2013 17:39     EKG Interpretation   Date/Time:  Wednesday November 05 2013 14:44:11 EDT Ventricular Rate:  86 PR Interval:    QRS Duration: 101 QT Interval:  405 QTC Calculation: 484 R Axis:   -49 Text Interpretation:  Atrial fibrillation Left anterior fascicular block  LVH with secondary repolarization abnormality Anterior Q waves, possibly  due to LVH Artifact in lead(s) I II aVR aVL V1 V2 Confirmed by Saqib Cazarez   MD, Ryland Tungate ([id number]) on 11/05/2013 4:49:36 PM      CRITICAL CARE Performed by: Fredia Sorrow Total critical care time: 30 Critical care time was exclusive of separately billable procedures and treating other patients. Critical care was necessary to treat or prevent imminent or life-threatening deterioration. Critical care was time spent personally by me on the following activities: development of treatment plan with patient and/or surrogate as well as nursing, discussions with consultants, evaluation of patient's response to treatment, examination of patient, obtaining history from patient or surrogate, ordering and performing treatments and interventions, ordering and review of laboratory studies, ordering and review of radiographic studies, pulse oximetry and re-evaluation of patient's condition.    MDM   Final diagnoses:  Syncope,  unspecified syncope type  Shingles  Hypokalemia   Initial clinical concerns were for her septic picture. Patient had some some periods of hypotension. However patient's husband states that she frequently has blood pressures around 85 systolic. Workup here without an exact cause of for infection. Chest x-ray negative for  pneumonia urinalysis negative for urinary tract infection. No significant leukocytosis a lactic acid is not elevated. Blood cultures were done along with urine culture patient was treated empirically with septic antibiotics based on her allergy profile. Patient will require admission for the 2 syncopal episodes. In addition patient is developing shingles in the 8 cranial nerve distribution. On the left side has vesicles developing around the left ear. No evidence of any vesicles in the external canal at this point in time but there are vesicles preauricular and postauricular and down a long the upper aspect of the sternocleidomastoid muscle. Possible this could be a non-cranial nerve 8 distribution.  In addition patient was hypotensive low as systolic was 80. His possible this could be more baseline. Now her systolic pressures were more consistently in the 120s. Patient received 2 L of fluid. Patient also was pancultured and started on septic antibiotics as noted above. Workup revealed no source for infection at this point in time. The patient does seem to be responding to the treatment.  I personally performed the services described in this documentation, which was scribed in my presence. The recorded information has been reviewed and is accurate.       Fredia Sorrow, MD 11/05/13 1921  Fredia Sorrow, MD 11/05/13 [phone]

## 2013-11-05 NOTE — Progress Notes (Signed)
ANTIBIOTIC CONSULT NOTE - INITIAL  Pharmacy Consult for Vancomycin, Aztreonam & Levaquin Indication: Sepsis  Allergies  Allergen Reactions  . Codeine   . Contrast Media [Iodinated Diagnostic Agents]     Per pt chart.  . Iodine   . Lovenox [Enoxaparin]   . Nitrofurantoin Other (See Comments)    Numbness in arms and legs  . Nitrofurantoin Monohyd Macro Other (See Comments)  . Penicillins Rash  . Sulfa Antibiotics Rash  . Sulfonamide Derivatives Rash    Patient Measurements:   Adjusted Body Weight:   Vital Signs: Temp: 97.8 F (36.6 C) (09/16 1433) Temp src: Oral (09/16 1433) BP: 85/51 mmHg (09/16 1630) Pulse Rate: 81 (09/16 1433) Intake/Output from previous day:   Intake/Output from this shift:    Labs:  Recent Labs  11/05/13 1455  WBC 5.1  HGB 13.7  PLT 138*  CREATININE 0.66   The CrCl is unknown because both a height and weight (above a minimum accepted value) are required for this calculation. No results found for this basename: VANCOTROUGH, VANCOPEAK, VANCORANDOM, GENTTROUGH, GENTPEAK, GENTRANDOM, TOBRATROUGH, TOBRAPEAK, TOBRARND, AMIKACINPEAK, AMIKACINTROU, AMIKACIN,  in the last 72 hours   Microbiology: No results found for this or any previous visit (from the past 720 hour(s)).  Medical History: Past Medical History  Diagnosis Date  . Hypertension   . Stroke   . Acid reflux   . Atrial fibrillation     tachybradycardia syndrome  . Cancer     facial  . Tobacco abuse 04/30/2011  . Cardiomyopathy 05/02/2011    EF 45-50%, per Echo.  . 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 Carotid artery stenosis 05/02/2011  . CAD (coronary artery disease) 07/20/2006    R/S MV - mild perfusion defect in apical region consistent w/ infarct/scar vs apical thinning; no scintigraphic evidence of inducible myocardial ischemia; baseline EKG demonstrates A Flutter w/ variable AV conduction  . Hyperlipidemia   . Chest pain   . Cerebral atherosclerosis 12/11/2011   Carotid duplex - R ICA 0-49% diameter reduction, velocities suggest high end; L ICA 0-49% reduction, velocities suggest mid range  . Neuropathy   . Syncope     Medications:  Scheduled:   Assessment: Vancomycin, Aztreonam & Levaquin pharmacy protocol sepsis Calculated CrCl 39 ml/min  Goal of Therapy:  Vancomycin trough level 15-20 mcg/ml  Plan:  Vancomycin 1 GM IV loading dose, then 750 mg IV every 24 hours Aztreonam 2 GM IV loading dose, then 1 GM IV every 8 hours Levaquin 750 mg IV every 48 hours Vancomycin trough at steady state Labs per protocol  Abner Greenspan, Rafeal Skibicki Bennett 11/05/2013,5:02 PM

## 2013-11-06 DIAGNOSIS — Z8673 Personal history of transient ischemic attack (TIA), and cerebral infarction without residual deficits: Secondary | ICD-10-CM

## 2013-11-06 DIAGNOSIS — E876 Hypokalemia: Secondary | ICD-10-CM

## 2013-11-06 DIAGNOSIS — B029 Zoster without complications: Secondary | ICD-10-CM

## 2013-11-06 DIAGNOSIS — R791 Abnormal coagulation profile: Secondary | ICD-10-CM

## 2013-11-06 DIAGNOSIS — R21 Rash and other nonspecific skin eruption: Secondary | ICD-10-CM | POA: Diagnosis present

## 2013-11-06 DIAGNOSIS — I9589 Other hypotension: Secondary | ICD-10-CM

## 2013-11-06 DIAGNOSIS — E785 Hyperlipidemia, unspecified: Secondary | ICD-10-CM

## 2013-11-06 DIAGNOSIS — F17200 Nicotine dependence, unspecified, uncomplicated: Secondary | ICD-10-CM

## 2013-11-06 LAB — TROPONIN I
Troponin I: <0.3 ng/mL (ref <0.30)
Troponin I: <0.3 ng/mL (ref <0.30)

## 2013-11-06 LAB — COMPREHENSIVE METABOLIC PANEL
ALT: 11 U/L (ref 0–35)
AST: 24 U/L (ref 0–37)
Albumin: 3.1 g/dL — ABNORMAL LOW (ref 3.5–5.2)
Alkaline Phosphatase: 56 U/L (ref 39–117)
Anion gap: 11 (ref 5–15)
BUN: 8 mg/dL (ref 6–23)
CALCIUM: 8 mg/dL — AB (ref 8.4–10.5)
CO2: 23 meq/L (ref 19–32)
CREATININE: 0.51 mg/dL (ref 0.50–1.10)
Chloride: 106 mEq/L (ref 96–112)
GLUCOSE: 91 mg/dL (ref 70–99)
Potassium: 4 mEq/L (ref 3.7–5.3)
Sodium: 140 mEq/L (ref 137–147)
Total Bilirubin: 0.7 mg/dL (ref 0.3–1.2)
Total Protein: 6.1 g/dL (ref 6.0–8.3)

## 2013-11-06 LAB — CBC
HCT: 38.1 % (ref 36.0–46.0)
Hemoglobin: 13.1 g/dL (ref 12.0–15.0)
MCH: 32.9 pg (ref 26.0–34.0)
MCHC: 34.4 g/dL (ref 30.0–36.0)
MCV: 95.7 fL (ref 78.0–100.0)
PLATELETS: 144 10*3/uL — AB (ref 150–400)
RBC: 3.98 MIL/uL (ref 3.87–5.11)
RDW: 13.8 % (ref 11.5–15.5)
WBC: 5.2 10*3/uL (ref 4.0–10.5)

## 2013-11-06 LAB — TSH: TSH: 0.512 u[IU]/mL (ref 0.350–4.500)

## 2013-11-06 LAB — PROTIME-INR
INR: 3.29 — ABNORMAL HIGH (ref 0.00–1.49)
Prothrombin Time: 33.5 seconds — ABNORMAL HIGH (ref 11.6–15.2)

## 2013-11-06 MED ORDER — NICOTINE 7 MG/24HR TD PT24
MEDICATED_PATCH | TRANSDERMAL | Status: AC
  Filled 2013-11-06: qty 1

## 2013-11-06 MED ORDER — NICOTINE 7 MG/24HR TD PT24
7.0000 mg | MEDICATED_PATCH | Freq: Every day | TRANSDERMAL | Status: DC
  Administered 2013-11-07 – 2013-11-11 (×5): 7 mg via TRANSDERMAL
  Filled 2013-11-06 (×10): qty 1

## 2013-11-06 MED ORDER — TEMAZEPAM 7.5 MG PO CAPS
7.5000 mg | ORAL_CAPSULE | Freq: Once | ORAL | Status: AC
  Administered 2013-11-07: 7.5 mg via ORAL
  Filled 2013-11-06: qty 1

## 2013-11-06 MED ORDER — WARFARIN - PHARMACIST DOSING INPATIENT
Status: DC
  Administered 2013-11-08 – 2013-11-09 (×2): 1

## 2013-11-06 NOTE — Care Management Utilization Note (Signed)
UR completed 

## 2013-11-06 NOTE — Progress Notes (Signed)
Patient seen, independently examined and chart reviewed. I agree with exam, assessment and plan discussed with Dyanne Carrel, NP.  78 year old woman with history of atrial fibrillation recently diagnosed tachycardia bradycardia syndrome with plans for loop recorder placement presented with recurrent syncope.  PMH Atrial fibrillation with recently diagnosed tachycardia bradycardia syndrome  Subjective: She is very stressed at the moment.  Objective: Afebrile, vital signs stable.  Gen. Appears anxious but comfortable, non-toxic.  Cardiovascular. RRR no m/r/g. No LE edema. Pectus excavatum.  Respiratory. CTA bilaterally no w/r/r. Normal resp effort.  Skin. Rash behind right ear, lateral neck with some vesicles.     Complete metabolic panel unremarkable. Troponins negative. BNP 5769. Lactic acid was normal. Urinalysis was equivocal.  Recurrent syncope with tachycardia bradycardia syndrome superimposed on atrial fibrillation. Discussed with cardiology, plan is to transfer to cardiology service at [facility] for further evaluation.  Rash left ear. Somewhat suggestive of uncomplicated shingles. Recommend  Valacyclovir 1000 mg three times daily and follow. No signs of systemic infection. Don't think requires airborne. RN to discuss with Infection Control.  Follow-up urine culture.  Murray Hodgkins, MD [facility] [phone]

## 2013-11-06 NOTE — Consult Note (Signed)
CARDIOLOGY CONSULT NOTE   Patient ID: Andrea [name] MRN: [id number] DOB/AGE: [birth year] 78 y.o.  Admit Date: 11/05/2013 Referring Physician: P[facility] Primary Physician: Glo Herring., MD Consulting Cardiologist: Kate Sable MD Primary Cardiologist Quay Burow MD Reason for Consultation: Atrial fib, Syncopal episode  Clinical Summary Ms. Dunne is a 78 y.o.female patient of Dr. Gwenlyn Found, with known history chronic atrial fib, on coumadin; hypertension, MCA CVA in 04/2011, with residual dysarthria and right sided weakness. Most recent office visit with Dr. Gwenlyn Found on 11/04/2013 state that she had several syncopal episodes with one month event monitor showing tachy-brady syndrome with pauses up to 2.6 seconds, and HR's up to 130 bpm. She was continued on lopressor 12.5 mg BID and referred to Dr. Orene Desanctis for loop recorder placement.   She presented to the ER with another syncopal episode while sitting in her wheel chair, with LOC for approx 15 minutes per note, with a recurrent episode while on the toilet. EMS was called. In ER,she was found to be hypotensive,  HR 81 bpm, atrial fib, BP 80/52. INR was 3.19, Pro-BNP 5,769. Potassium 3.2 Glucose 191, Albumin 3.4. Troponin <30.0  Found to have UTI.   CT scan was negative for acute intracranial abnormalities. . No acute hemorrhage, infarct, or mass lesion is identified. No midline shift. Orbits and paranasal sinuses are within normal limits.She was treated with IV fluids, vancomycin, Levoquin, and Valtrex in the setting of rash on her right ear thought to be shingles.    Allergies  Allergen Reactions  . Codeine   . Contrast Media [Iodinated Diagnostic Agents]     Per pt chart.  . Iodine   . Lovenox [Enoxaparin]   . Nitrofurantoin Other (See Comments)    Numbness in arms and legs  . Nitrofurantoin Monohyd Macro Other (See Comments)  . Penicillins Rash  . Sulfa Antibiotics Rash  . Sulfonamide Derivatives Rash     Medications Scheduled Medications: . atorvastatin  10 mg Oral QHS  . benazepril  5 mg Oral Daily  . ciprofloxacin  400 mg Intravenous Q12H  . docusate sodium  100 mg Oral BID  . isosorbide mononitrate  30 mg Oral Daily  . metoprolol tartrate  12.5 mg Oral Daily  . mirabegron ER  25 mg Oral Daily  . niacin  500 mg Oral Daily  . oxybutynin  10 mg Oral QHS  . pantoprazole  40 mg Oral Daily  . prazosin  2 mg Oral QPM  . sodium chloride  3 mL Intravenous Q12H  . valACYclovir  1,000 mg Oral Daily  . [START ON 11/07/2013] Warfarin - Pharmacist Dosing Inpatient   Does not apply Q24H    Infusions: . sodium chloride 100 mL/hr at 11/05/13 2301    PRN Medications: nitroGLYCERIN, ondansetron (ZOFRAN) IV, ondansetron, traMADol-acetaminophen, zolpidem   Past Medical History  Diagnosis Date  . Hypertension   . Stroke   . Acid reflux   . Atrial fibrillation     tachybradycardia syndrome  . Cancer     facial  . Tobacco abuse 04/30/2011  . Cardiomyopathy 05/02/2011    EF 45-50%, per Echo.  . 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 Carotid artery stenosis 05/02/2011  . CAD (coronary artery disease) 07/20/2006    R/S MV - mild perfusion defect in apical region consistent w/ infarct/scar vs apical thinning; no scintigraphic evidence of inducible myocardial ischemia; baseline EKG demonstrates A Flutter w/ variable AV conduction  . Hyperlipidemia   . Chest pain   .  Cerebral atherosclerosis 12/11/2011    Carotid duplex - R ICA 0-49% diameter reduction, velocities suggest high end; L ICA 0-49% reduction, velocities suggest mid range  . Neuropathy   . Syncope     Past Surgical History  Procedure Laterality Date  . Cholecystectomy    . Skin cancer excision    . Tonsillectomy    . Tee with cardioversion      failed in past but dates not given    Family History  Problem Relation Age of Onset  . Adopted: Yes    Social History Ms. Faulks reports that she has been smoking  Cigarettes.  She has a 20 pack-year smoking history. She has never used smokeless tobacco. Ms. Kimberlin reports that she does not drink alcohol.  Review of Systems Otherwise reviewed and negative except as outlined.  Physical Examination Blood pressure 131/73, pulse 74, temperature 98.2 F (36.8 C), temperature source Oral, resp. rate 20, height 5\' 4"  (1.626 m), weight 115 lb 14.4 oz (52.572 kg), SpO2 99.00%.  Intake/Output Summary (Last 24 hours) at 11/06/13 1145 Last data filed at 11/06/13 0900  Gross per 24 hour  Intake    123 ml  Output   1463 ml  Net  -1340 ml    Telemetry: Atrial fib  GEN: Frail elderly thin. HEENT: Conjunctiva and lids normal, oropharynx clear with moist mucosa. Neck: Supple, no elevated JVP or carotid bruits, no thyromegaly. Lungs: Some crackles in the bases, no wheezes. Cardiac: Iregular rate and rhythm, no S3 or significant systolic murmur, no pericardial rub. Abdomen: Soft, nontender, no hepatomegaly, bowel sounds present, no guarding or rebound. Extremities: No pitting edema, distal pulses 2+. Skin: Warm and dry. Musculoskeletal: No kyphosis. Pectus excavatum noted in chest.   Neuropsychiatric: Alert and oriented x3, affect grossly appropriate.  Prior Cardiac Testing/Procedures  1.Echocardiogram: 10/04/2012 Study Conclusions  - Left ventricle: The cavity size was normal. Wall thickness was normal. Systolic function was normal. The estimated ejection fraction was in the range of 55% to 60%. - Mitral valve: Mild regurgitation. - Right atrium: The atrium was mildly to moderately dilated.  2.Abnormal Cardiac Monitoring 10/18/2013 (Please see note) Lab Results  Basic Metabolic Panel:  Recent Labs Lab 11/05/13 1455 11/06/13 0305  NA 141 140  K 3.2* 4.0  CL 101 106  CO2 26 23  GLUCOSE 191* 91  BUN 9 8  CREATININE 0.66 0.51  CALCIUM 8.7 8.0*    Liver Function Tests:  Recent Labs Lab 11/05/13 1455 11/06/13 0305  AST 22 24  ALT 12  11  ALKPHOS 63 56  BILITOT 0.8 0.7  PROT 6.6 6.1  ALBUMIN 3.4* 3.1*    CBC:  Recent Labs Lab 11/05/13 1455 11/06/13 0305  WBC 5.1 5.2  NEUTROABS 3.6  --   HGB 13.7 13.1  HCT 40.8 38.1  MCV 97.6 95.7  PLT 138* 144*    Cardiac Enzymes:  Recent Labs Lab 11/05/13 1455 11/05/13 2120 11/06/13 0305 11/06/13 0930  TROPONINI <0.30 <0.30 <0.30 <0.30    Radiology: Ct Head Wo Contrast  11/05/2013   CLINICAL DATA:  Altered mental status, loss of consciousness  EXAM: CT HEAD WITHOUT CONTRAST  TECHNIQUE: Contiguous axial images were obtained from the base of the skull through the vertex without intravenous contrast.  COMPARISON:  10/04/2012  FINDINGS: Mild left parietal encephalomalacia is stable. Mild cortical volume loss noted with proportional ventricular prominence. Areas of periventricular white matter hypodensity are most compatible with small vessel ischemic change. No acute hemorrhage, infarct, or  mass lesion is identified. No midline shift. Orbits and paranasal sinuses are within normal limits.  IMPRESSION: No acute intracranial finding.  Chronic stable findings as above.   Electronically Signed   By: Conchita Paris M.D.   On: 11/05/2013 17:58   Dg Chest Port 1 View  11/05/2013   CLINICAL DATA:  Weakness and loss of consciousness.  EXAM: PORTABLE CHEST - 1 VIEW  COMPARISON:  10/04/2012  FINDINGS: There is stable elevation of the left hemidiaphragm with left lower lobe atelectasis. There is no evidence of pulmonary edema, consolidation, pneumothorax, nodule or pleural fluid. The heart size and mediastinal contours are stable.  IMPRESSION: Stable elevation of left hemidiaphragm with left lower lobe atelectasis.   Electronically Signed   By: Aletta Edouard M.D.   On: 11/05/2013 17:39     ECG: Atrial fibrillation , LVH.    Impression and Recommendations  1.Recurrent Syncopal Episodes:  Likely episodes of tachy/brady with noted pauses on recent cardiac monitor placed August of  2015. It was noted that she has had pauses up to 2.6 seconds on review of notes. She continues on low dose BB. She is currently asymptomatic. She will be tranferred to [facility] for loop recorder placement vs pacemaker. I have spoken her her husband, Brehanna Deveny by phone to discuss our plans for transfer and possible pacemaker vs placement of loop recorder as planned by Dr. Gwenlyn Found on last office visit. She is on coumadin for atrial fib. This will need to be considered in planning procedures.   2. Atrial fib: Cardiac monitor demonstrating tachybrady syndrome with pauses. She is on coumadin. Will need to hold if planned intervention. Will defer to Dr. Gwenlyn Found at [facility].   3. Hx of MCA infarct: CT scan this admission does not demonstrate new CVA or hemorrhage.   4. UTI: Continues on abx therapy.  5. Rash: Concerns for shingles in right ear. Continue valtrex.   6. Tobacco abuse: Cessation recommended.   Signed: Phill Myron. Lety Cullens NP  11/06/2013, 11:45 AM Co-Sign MD

## 2013-11-06 NOTE — Progress Notes (Signed)
[facility] PROGRESS NOTE  Andrea [name] VQQ:[id number] DOB: 1935/04/01 DOA: 11/05/2013 PCP: Glo Herring., MD  Assessment/Plan: 1. Syncope: in setting of possible UTI and during bowel movement in patient with  multiple syncopal episodes. Evaluated by cardiology on 11/04/13. Recent event monitor revealed tachycardia-bradycardia syndrome. Plan for referal to Dr Sallyanne Kuster for loop recorder noted. She was evaluated by cardiology here at [facility] who recommend transfer for implantable loop recorder.   2. UTI. She received vancomycin and aztreonam in ED. Provided with cipro on admission. She remained afebrile and non-toxic appearing 3. Hypertension. Controlled. Home meds include benazepril, imdur, lopressor, minipress. Lopressor held on admission 4. Vascular dementia. Appears stable at baseline. Continue statin 5. Chronic atrial fibrillation on anticoagulation therapy. Coumadin per pharmacy. INR 3.29 today. Holding coumadin.  6. Rash: left neck under ear vesicles. No erythema. No pain. Concern for shingles. Continue Valtrex. monitor    Code Status:full Family Communication: none present Disposition Plan: transfer to [facility]   Consultants:  cardiology  Procedures:  none  Antibiotics:  cipro 11/05/13>>  HPI/Subjective: Awake alert denies pain discomfort. Requesting to go home  Objective: Filed Vitals:   11/06/13 0609  BP: 131/73  Pulse: 74  Temp: 98.2 F (36.8 C)  Resp: 20    Intake/Output Summary (Last 24 hours) at 11/06/13 1236 Last data filed at 11/06/13 0900  Gross per 24 hour  Intake    123 ml  Output   1463 ml  Net  -1340 ml   Filed Weights   11/05/13 1809 11/05/13 2100 11/05/13 2314  Weight: 48.6 kg (107 lb 2.3 oz) 52.572 kg (115 lb 14.4 oz) 52.572 kg (115 lb 14.4 oz)    Exam:   General:  Thin somewhat frail  Cardiovascular: irregularly irregular no MGR trace LE edema PPP  Respiratory: normal effort BS with fine crackles bilateral base no wheeze no  rhonchi  Abdomen: soft +BS non-tender to palpation  Musculoskeletal: joints without swelling/erythema   Skin: several small clear vesicals on left side on neck around base of ear in somewhat linear pattern. No erythema, no pain on palpation. No drainage.   Data Reviewed: Basic Metabolic Panel:  Recent Labs Lab 11/05/13 1455 11/06/13 0305  NA 141 140  K 3.2* 4.0  CL 101 106  CO2 26 23  GLUCOSE 191* 91  BUN 9 8  CREATININE 0.66 0.51  CALCIUM 8.7 8.0*   Liver Function Tests:  Recent Labs Lab 11/05/13 1455 11/06/13 0305  AST 22 24  ALT 12 11  ALKPHOS 63 56  BILITOT 0.8 0.7  PROT 6.6 6.1  ALBUMIN 3.4* 3.1*   No results found for this basename: LIPASE, AMYLASE,  in the last 168 hours No results found for this basename: AMMONIA,  in the last 168 hours CBC:  Recent Labs Lab 11/05/13 1455 11/06/13 0305  WBC 5.1 5.2  NEUTROABS 3.6  --   HGB 13.7 13.1  HCT 40.8 38.1  MCV 97.6 95.7  PLT 138* 144*   Cardiac Enzymes:  Recent Labs Lab 11/05/13 1455 11/05/13 2120 11/06/13 0305 11/06/13 0930  TROPONINI <0.30 <0.30 <0.30 <0.30   BNP (last 3 results)  Recent Labs  11/05/13 1717  PROBNP 5769.0*   CBG: No results found for this basename: GLUCAP,  in the last 168 hours  Recent Results (from the past 240 hour(s))  CULTURE, BLOOD (ROUTINE X 2)     Status: None   Collection Time    11/05/13  5:17 PM      Result Value  Ref Range Status   Specimen Description BLOOD LEFT HAND   Final   Special Requests BOTTLES DRAWN AEROBIC AND ANAEROBIC 6CC   Final   Culture NO GROWTH 1 DAY   Final   Report Status PENDING   Incomplete  CULTURE, BLOOD (ROUTINE X 2)     Status: None   Collection Time    11/05/13  6:21 PM      Result Value Ref Range Status   Specimen Description BLOOD LEFT HAND   Final   Special Requests BOTTLES DRAWN AEROBIC AND ANAEROBIC 8CC EACH   Final   Culture NO GROWTH 1 DAY   Final   Report Status PENDING   Incomplete     Studies: Ct Head Wo  Contrast  11/05/2013   CLINICAL DATA:  Altered mental status, loss of consciousness  EXAM: CT HEAD WITHOUT CONTRAST  TECHNIQUE: Contiguous axial images were obtained from the base of the skull through the vertex without intravenous contrast.  COMPARISON:  10/04/2012  FINDINGS: Mild left parietal encephalomalacia is stable. Mild cortical volume loss noted with proportional ventricular prominence. Areas of periventricular white matter hypodensity are most compatible with small vessel ischemic change. No acute hemorrhage, infarct, or mass lesion is identified. No midline shift. Orbits and paranasal sinuses are within normal limits.  IMPRESSION: No acute intracranial finding.  Chronic stable findings as above.   Electronically Signed   By: Conchita Paris M.D.   On: 11/05/2013 17:58   Dg Chest Port 1 View  11/05/2013   CLINICAL DATA:  Weakness and loss of consciousness.  EXAM: PORTABLE CHEST - 1 VIEW  COMPARISON:  10/04/2012  FINDINGS: There is stable elevation of the left hemidiaphragm with left lower lobe atelectasis. There is no evidence of pulmonary edema, consolidation, pneumothorax, nodule or pleural fluid. The heart size and mediastinal contours are stable.  IMPRESSION: Stable elevation of left hemidiaphragm with left lower lobe atelectasis.   Electronically Signed   By: Aletta Edouard M.D.   On: 11/05/2013 17:39    Scheduled Meds: . atorvastatin  10 mg Oral QHS  . benazepril  5 mg Oral Daily  . ciprofloxacin  400 mg Intravenous Q12H  . docusate sodium  100 mg Oral BID  . isosorbide mononitrate  30 mg Oral Daily  . metoprolol tartrate  12.5 mg Oral Daily  . mirabegron ER  25 mg Oral Daily  . niacin  500 mg Oral Daily  . oxybutynin  10 mg Oral QHS  . pantoprazole  40 mg Oral Daily  . prazosin  2 mg Oral QPM  . sodium chloride  3 mL Intravenous Q12H  . valACYclovir  1,000 mg Oral Daily  . [START ON 11/07/2013] Warfarin - Pharmacist Dosing Inpatient   Does not apply Q24H   Continuous  Infusions: . sodium chloride 100 mL/hr at 11/06/13 1233    Principal Problem:   Syncope Active Problems:   Aphasia   Atrial fibrillation   UTI (lower urinary tract infection)   Essential hypertension   Tobacco abuse   Hyperlipidemia   Rash and nonspecific skin eruption    Time spent: 35 minutes    [facility] Pager [phone]. If 7PM-7AM, please contact night-coverage at [email or web address], password [facility] 11/06/2013, 12:36 PM  LOS: 1 day

## 2013-11-06 NOTE — Progress Notes (Signed)
[facility] for Warfarin Indication: atrial fibrillation  Allergies  Allergen Reactions  . Codeine   . Contrast Media [Iodinated Diagnostic Agents]     Per pt chart.  . Iodine   . Lovenox [Enoxaparin]   . Nitrofurantoin Other (See Comments)    Numbness in arms and legs  . Nitrofurantoin Monohyd Macro Other (See Comments)  . Penicillins Rash  . Sulfa Antibiotics Rash  . Sulfonamide Derivatives Rash   Patient Measurements: Height: 5\' 4"  (162.6 cm) Weight: 115 lb 14.4 oz (52.572 kg) IBW/kg (Calculated) : 54.7  Vital Signs: Temp: 98.2 F (36.8 C) (09/17 0609) Temp src: Oral (09/17 0609) BP: 131/73 mmHg (09/17 0609) Pulse Rate: 74 (09/17 0609)  Labs:  Recent Labs  11/05/13 1455 11/05/13 1717 11/05/13 2120 11/06/13 0305  HGB 13.7  --   --  13.1  HCT 40.8  --   --  38.1  PLT 138*  --   --  144*  LABPROT  --  32.7*  --  33.5*  INR  --  3.19*  --  3.29*  CREATININE 0.66  --   --  0.51  TROPONINI <0.30  --  <0.30 <0.30   Estimated Creatinine Clearance: 49.7 ml/min (by C-G formula based on Cr of 0.51).  Medical History: Past Medical History  Diagnosis Date  . Hypertension   . Stroke   . Acid reflux   . Atrial fibrillation     tachybradycardia syndrome  . Cancer     facial  . Tobacco abuse 04/30/2011  . Cardiomyopathy 05/02/2011    EF 45-50%, per Echo.  . 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 Carotid artery stenosis 05/02/2011  . CAD (coronary artery disease) 07/20/2006    R/S MV - mild perfusion defect in apical region consistent w/ infarct/scar vs apical thinning; no scintigraphic evidence of inducible myocardial ischemia; baseline EKG demonstrates A Flutter w/ variable AV conduction  . Hyperlipidemia   . Chest pain   . Cerebral atherosclerosis 12/11/2011    Carotid duplex - R ICA 0-49% diameter reduction, velocities suggest high end; L ICA 0-49% reduction, velocities suggest mid range  . Neuropathy   . Syncope     Medications:  Prescriptions prior to admission  Medication Sig Dispense Refill  . atorvastatin (LIPITOR) 10 MG tablet Take 1 tablet (10 mg total) by mouth at bedtime.  30 tablet  11  . benazepril (LOTENSIN) 5 MG tablet Take 5 mg by mouth daily.      Marland Kitchen CALCIUM-VITAMIN D PO Take 1 tablet by mouth daily.      Marland Kitchen esomeprazole (NEXIUM) 20 MG capsule Take 20 mg by mouth daily before breakfast.      . estazolam (PROSOM) 2 MG tablet Take 2 mg by mouth at bedtime.      . isosorbide mononitrate (IMDUR) 30 MG 24 hr tablet Take 1 tablet (30 mg total) by mouth daily.  30 tablet  11  . metoprolol tartrate (LOPRESSOR) 25 MG tablet Take 12.5 mg by mouth daily.      Marland Kitchen MYRBETRIQ 25 MG TB24 Take 25 mg by mouth daily.      . niacin 500 MG tablet Take 500 mg by mouth daily.      Marland Kitchen oxybutynin (DITROPAN-XL) 10 MG 24 hr tablet Take 10 mg by mouth at bedtime.      . Potassium 99 MG TABS Take 1 tablet by mouth daily.      . prazosin (MINIPRESS) 2 MG  capsule Take 2 mg by mouth 2 (two) times daily.       . traMADol-acetaminophen (ULTRACET) 37.5-325 MG per tablet Take 37.5 tablets by mouth daily as needed for moderate pain or severe pain (*May take two tablets three times daily for hip pain).       Marland Kitchen trimethoprim (TRIMPEX) 100 MG tablet Take 100 mg by mouth daily.       Marland Kitchen warfarin (COUMADIN) 4 MG tablet Take 4 mg by mouth every evening.       Marland Kitchen NITROSTAT 0.4 MG SL tablet Place 0.4 mg under the tongue every 5 (five) minutes as needed for chest pain.         Assessment: Okay for Protocol, currently being treated w/ Cipro for UTI.  INR above goal.  No bleeding noted, CBC WNL.  Goal of Therapy:  INR 2-3   Plan:  No Warfarin today. Daily PT/INR.  Pricilla Larsson 11/06/2013,9:27 AM

## 2013-11-06 NOTE — Consult Note (Signed)
The patient was seen and examined, and I agree with the assessment and plan as documented above, with modifications as noted below. 78 yr old patient of Dr. Gwenlyn Found whom he most recently evaluated on 11/04/13, with a history of CVA, chronic atrial fibrillation, HTN, and hyperlipidemia and multiple syncopal episodes. She presented with two syncopal episodes, one occurring while sitting in a wheelchair and lasting 15 minutes and the other occurred while she was having a bowel movement. She was noted to be hypotensive upon arrival, hypokalemic, and subsequently found to have a UTI, and is on antimicrobial therapy. She is on low-dose beta blockade. A relatively recent event monitor demonstrated evidence for tachycardia-bradycardia syndrome, and Dr. Kennon Holter plan was to refer to Dr. Sallyanne Kuster for loop recorder implantation. While the second episode may certainly have been vasovagal in etiology (having a BM in addition to having a UTI), the first episode which occurred while sitting in a wheelchair may have been arrhythmic. Given her multiple recurrent syncopal episodes, I feel she will be best served with an implantable loop recorder. If she were to require pacemaker placement, she will need metoprolol discontinued and washed out beforehand to prove there is no pharmacologic provocation for sinus pauses/arrest. She is on warfarin and INR will need to be reversed as she is slightly supratherapeutic (3.29). While the patient was initially disagreeable, both she, Jory Sims NP, and myself have spoken to her husband who has convinced his wife this is the most prudent course of action.

## 2013-11-07 DIAGNOSIS — F17200 Nicotine dependence, unspecified, uncomplicated: Secondary | ICD-10-CM | POA: Diagnosis present

## 2013-11-07 DIAGNOSIS — I10 Essential (primary) hypertension: Secondary | ICD-10-CM | POA: Diagnosis present

## 2013-11-07 DIAGNOSIS — E785 Hyperlipidemia, unspecified: Secondary | ICD-10-CM | POA: Diagnosis present

## 2013-11-07 DIAGNOSIS — I951 Orthostatic hypotension: Secondary | ICD-10-CM | POA: Diagnosis present

## 2013-11-07 DIAGNOSIS — Z7901 Long term (current) use of anticoagulants: Secondary | ICD-10-CM | POA: Diagnosis not present

## 2013-11-07 DIAGNOSIS — R55 Syncope and collapse: Secondary | ICD-10-CM | POA: Diagnosis present

## 2013-11-07 DIAGNOSIS — F0150 Vascular dementia without behavioral disturbance: Secondary | ICD-10-CM | POA: Diagnosis present

## 2013-11-07 DIAGNOSIS — I672 Cerebral atherosclerosis: Secondary | ICD-10-CM | POA: Diagnosis present

## 2013-11-07 DIAGNOSIS — N390 Urinary tract infection, site not specified: Secondary | ICD-10-CM | POA: Diagnosis present

## 2013-11-07 DIAGNOSIS — I4891 Unspecified atrial fibrillation: Secondary | ICD-10-CM | POA: Diagnosis present

## 2013-11-07 DIAGNOSIS — I495 Sick sinus syndrome: Secondary | ICD-10-CM | POA: Diagnosis present

## 2013-11-07 DIAGNOSIS — I455 Other specified heart block: Secondary | ICD-10-CM

## 2013-11-07 DIAGNOSIS — R4701 Aphasia: Secondary | ICD-10-CM | POA: Diagnosis present

## 2013-11-07 DIAGNOSIS — B029 Zoster without complications: Secondary | ICD-10-CM | POA: Diagnosis present

## 2013-11-07 LAB — BASIC METABOLIC PANEL
Anion gap: 13 (ref 5–15)
BUN: 6 mg/dL (ref 6–23)
CO2: 23 meq/L (ref 19–32)
Calcium: 8.6 mg/dL (ref 8.4–10.5)
Chloride: 104 mEq/L (ref 96–112)
Creatinine, Ser: 0.48 mg/dL — ABNORMAL LOW (ref 0.50–1.10)
GFR calc Af Amer: >90 mL/min (ref >90)
GFR calc non Af Amer: >90 mL/min (ref >90)
Glucose, Bld: 83 mg/dL (ref 70–99)
Potassium: 3.7 mEq/L (ref 3.7–5.3)
Sodium: 140 mEq/L (ref 137–147)

## 2013-11-07 LAB — PROTIME-INR
INR: 2.5 — ABNORMAL HIGH (ref 0.00–1.49)
PROTHROMBIN TIME: 27 s — AB (ref 11.6–15.2)

## 2013-11-07 MED ORDER — VALACYCLOVIR HCL 500 MG PO TABS
1000.0000 mg | ORAL_TABLET | Freq: Three times a day (TID) | ORAL | Status: DC
  Administered 2013-11-07 – 2013-11-11 (×12): 1000 mg via ORAL
  Filled 2013-11-07 (×23): qty 2

## 2013-11-07 MED ORDER — DIPHENHYDRAMINE HCL 25 MG PO CAPS: 25.0000 mg | ORAL_CAPSULE | Freq: Four times a day (QID) | ORAL | Status: DC | PRN

## 2013-11-07 MED ORDER — CEPHALEXIN 500 MG PO CAPS
500.0000 mg | ORAL_CAPSULE | Freq: Two times a day (BID) | ORAL | Status: DC
  Administered 2013-11-07 – 2013-11-11 (×8): 500 mg via ORAL
  Filled 2013-11-07 (×12): qty 1

## 2013-11-07 MED ORDER — DIPHENHYDRAMINE HCL 50 MG/ML IJ SOLN: 25.0000 mg | Freq: Once | INTRAMUSCULAR | Status: DC

## 2013-11-07 MED ORDER — DIPHENHYDRAMINE HCL 50 MG/ML IJ SOLN
INTRAMUSCULAR | Status: AC
  Administered 2013-11-07: 25 mg
  Filled 2013-11-07: qty 1

## 2013-11-07 NOTE — Plan of Care (Signed)
Per dr. Request and documentation - pt appears to have shingles but its a small area and vesicles are not reputured.  Furthermore - there is no s/sx of possible systemic infection.  After speaking with infection control Clyda Hurdle) she agreed we could remove Airborne and just place on contact precautions unless vesicles rupture.  Please re-place on Airborne is vesicles begin to drain!

## 2013-11-07 NOTE — Progress Notes (Signed)
PT ARRIVED TO 5Y85 VIA [facility] FROM [facility]. SLEEPY BUT EASILY AROUSABLE. NO FAMILY AT BEDSIDE

## 2013-11-07 NOTE — Progress Notes (Signed)
Consulting cardiologist: Kate Sable MD Primary Cardiologist: Quay Burow MD  Subjective:   Awaiting cardiac isolation bed availability.   Confused thoughts with anxiety  Objective:   Temp:  [97.3 F (36.3 C)-98 F (36.7 C)] 97.3 F (36.3 C) (09/18 0645) Pulse Rate:  [74-88] 74 (09/18 0645) Resp:  [20] 20 (09/18 0645) BP: (119-129)/(68-76) 121/69 mmHg (09/18 0645) SpO2:  [98 %-100 %] 100 % (09/18 0645) Last BM Date: 11/05/14  Filed Weights   11/05/13 1809 11/05/13 2100 11/05/13 2314  Weight: 107 lb 2.3 oz (48.6 kg) 115 lb 14.4 oz (52.572 kg) 115 lb 14.4 oz (52.572 kg)    Intake/Output Summary (Last 24 hours) at 11/07/13 1015 Last data filed at 11/07/13 0900  Gross per 24 hour  Intake 2790.83 ml  Output   1600 ml  Net 1190.83 ml    Telemetry:   Atrial fib with rates of 112 on average.   Exam:  General: No acute distress.  HEENT: Conjunctiva and lids normal, oropharynx clear.  Lungs: Clear to auscultation, nonlabored.Pectus excavatum noted in chest  Cardiac: No elevated JVP or bruits. IRRR, no gallop or rub.   Abdomen: Normoactive bowel sounds, nontender, nondistended.  Extremities: No pitting edema, distal pulses full.  Neuropsychiatric: Alert, confused,  affect appropriate.   Lab Results:  Basic Metabolic Panel:  Recent Labs Lab 11/05/13 1455 11/06/13 0305 11/07/13 0700  NA 141 140 140  K 3.2* 4.0 3.7  CL 101 106 104  CO2 26 23 23   GLUCOSE 191* 91 83  BUN 9 8 6   CREATININE 0.66 0.51 0.48*  CALCIUM 8.7 8.0* 8.6    Liver Function Tests:  Recent Labs Lab 11/05/13 1455 11/06/13 0305  AST 22 24  ALT 12 11  ALKPHOS 63 56  BILITOT 0.8 0.7  PROT 6.6 6.1  ALBUMIN 3.4* 3.1*    CBC:  Recent Labs Lab 11/05/13 1455 11/06/13 0305  WBC 5.1 5.2  HGB 13.7 13.1  HCT 40.8 38.1  MCV 97.6 95.7  PLT 138* 144*    Cardiac Enzymes:  Recent Labs Lab 11/05/13 2120 11/06/13 0305 11/06/13 0930  TROPONINI <0.30 <0.30 <0.30     BNP:  Recent Labs  11/05/13 1717  PROBNP 5769.0*    Coagulation:  Recent Labs Lab 11/05/13 1717 11/06/13 0305 11/07/13 0700  INR 3.19* 3.29* 2.50*    Radiology: Ct Head Wo Contrast  11/05/2013   CLINICAL DATA:  Altered mental status, loss of consciousness  EXAM: CT HEAD WITHOUT CONTRAST  TECHNIQUE: Contiguous axial images were obtained from the base of the skull through the vertex without intravenous contrast.  COMPARISON:  10/04/2012  FINDINGS: Mild left parietal encephalomalacia is stable. Mild cortical volume loss noted with proportional ventricular prominence. Areas of periventricular white matter hypodensity are most compatible with small vessel ischemic change. No acute hemorrhage, infarct, or mass lesion is identified. No midline shift. Orbits and paranasal sinuses are within normal limits.  IMPRESSION: No acute intracranial finding.  Chronic stable findings as above.   Electronically Signed   By: Conchita Paris M.D.   On: 11/05/2013 17:58   Dg Chest Port 1 View  11/05/2013   CLINICAL DATA:  Weakness and loss of consciousness.  EXAM: PORTABLE CHEST - 1 VIEW  COMPARISON:  10/04/2012  FINDINGS: There is stable elevation of the left hemidiaphragm with left lower lobe atelectasis. There is no evidence of pulmonary edema, consolidation, pneumothorax, nodule or pleural fluid. The heart size and mediastinal contours are stable.  IMPRESSION: Stable elevation  of left hemidiaphragm with left lower lobe atelectasis.   Electronically Signed   By: Aletta Edouard M.D.   On: 11/05/2013 17:39     Medications:   Scheduled Medications: . atorvastatin  10 mg Oral QHS  . benazepril  5 mg Oral Daily  . ciprofloxacin  400 mg Intravenous Q12H  . docusate sodium  100 mg Oral BID  . isosorbide mononitrate  30 mg Oral Daily  . metoprolol tartrate  12.5 mg Oral Daily  . mirabegron ER  25 mg Oral Daily  . niacin  500 mg Oral Daily  . nicotine  7 mg Transdermal Daily  . oxybutynin  10 mg Oral  QHS  . pantoprazole  40 mg Oral Daily  . prazosin  2 mg Oral QPM  . sodium chloride  3 mL Intravenous Q12H  . valACYclovir  1,000 mg Oral Daily  . Warfarin - Pharmacist Dosing Inpatient   Does not apply Q24H    Infusions: . sodium chloride 50 mL/hr at 11/07/13 0854    PRN Medications: nitroGLYCERIN, ondansetron (ZOFRAN) IV, ondansetron, traMADol-acetaminophen   Assessment and Plan:   1. Atrial fibrillation with evidence of tachy/brady syndrome: She is planned to go to [facility] for evaluation of implantation of loop recorder vs pacemaker placement with recurrent syncopal episodes. Awaiting bed placement,. On metoprolol tartrate at 12.5 mg daily only. HR is tachycardiac at rest, but reluctant to place on BID dosing with history of 2.6 second pauses on OP cardiac monitor. Continues on coumadin. INR 2.5 this am. Decision to stop when interventional procedure is chosen.  2. Hypertension: Stable on benaepril, Imdur.   3.Hx if /MCA infarct: Dementia is noted with anxiety more prominent this am.   4.  Hypercholesterolemia: Continue statin therapy.     Phill Myron. Dick Hark NP  11/07/2013, 10:15 AM

## 2013-11-07 NOTE — Progress Notes (Signed)
[facility] for Warfarin Indication: atrial fibrillation  Allergies  Allergen Reactions  . Codeine   . Contrast Media [Iodinated Diagnostic Agents]     Per pt chart.  . Iodine   . Lovenox [Enoxaparin]   . Nitrofurantoin Other (See Comments)    Numbness in arms and legs  . Nitrofurantoin Monohyd Macro Other (See Comments)  . Penicillins Rash  . Sulfa Antibiotics Rash  . Sulfonamide Derivatives Rash   Patient Measurements: Height: 5\' 4"  (162.6 cm) Weight: 115 lb 14.4 oz (52.572 kg) IBW/kg (Calculated) : 54.7  Vital Signs: Temp: 97.3 F (36.3 C) (09/18 0645) Temp src: Oral (09/18 0645) BP: 120/72 mmHg (09/18 1127) Pulse Rate: 74 (09/18 0645)  Labs:  Recent Labs  11/05/13 1455 11/05/13 1717 11/05/13 2120 11/06/13 0305 11/06/13 0930 11/07/13 0700  HGB 13.7  --   --  13.1  --   --   HCT 40.8  --   --  38.1  --   --   PLT 138*  --   --  144*  --   --   LABPROT  --  32.7*  --  33.5*  --  27.0*  INR  --  3.19*  --  3.29*  --  2.50*  CREATININE 0.66  --   --  0.51  --  0.48*  TROPONINI <0.30  --  <0.30 <0.30 <0.30  --    Estimated Creatinine Clearance: 49.7 ml/min (by C-G formula based on Cr of 0.48).  Medical History: Past Medical History  Diagnosis Date  . Hypertension   . Stroke   . Acid reflux   . Atrial fibrillation     tachybradycardia syndrome  . Cancer     facial  . Tobacco abuse 04/30/2011  . Cardiomyopathy 05/02/2011    EF 45-50%, per Echo.  . 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 Carotid artery stenosis 05/02/2011  . CAD (coronary artery disease) 07/20/2006    R/S MV - mild perfusion defect in apical region consistent w/ infarct/scar vs apical thinning; no scintigraphic evidence of inducible myocardial ischemia; baseline EKG demonstrates A Flutter w/ variable AV conduction  . Hyperlipidemia   . Chest pain   . Cerebral atherosclerosis 12/11/2011    Carotid duplex - R ICA 0-49% diameter reduction, velocities  suggest high end; L ICA 0-49% reduction, velocities suggest mid range  . Neuropathy   . Syncope    Medications:  Prescriptions prior to admission  Medication Sig Dispense Refill  . atorvastatin (LIPITOR) 10 MG tablet Take 1 tablet (10 mg total) by mouth at bedtime.  30 tablet  11  . benazepril (LOTENSIN) 5 MG tablet Take 5 mg by mouth daily.      Marland Kitchen CALCIUM-VITAMIN D PO Take 1 tablet by mouth daily.      Marland Kitchen esomeprazole (NEXIUM) 20 MG capsule Take 20 mg by mouth daily before breakfast.      . estazolam (PROSOM) 2 MG tablet Take 2 mg by mouth at bedtime.      . isosorbide mononitrate (IMDUR) 30 MG 24 hr tablet Take 1 tablet (30 mg total) by mouth daily.  30 tablet  11  . metoprolol tartrate (LOPRESSOR) 25 MG tablet Take 12.5 mg by mouth daily.      Marland Kitchen MYRBETRIQ 25 MG TB24 Take 25 mg by mouth daily.      . niacin 500 MG tablet Take 500 mg by mouth daily.      Marland Kitchen oxybutynin (DITROPAN-XL)  10 MG 24 hr tablet Take 10 mg by mouth at bedtime.      . Potassium 99 MG TABS Take 1 tablet by mouth daily.      . prazosin (MINIPRESS) 2 MG capsule Take 2 mg by mouth 2 (two) times daily.       . traMADol-acetaminophen (ULTRACET) 37.5-325 MG per tablet Take 37.5 tablets by mouth daily as needed for moderate pain or severe pain (*May take two tablets three times daily for hip pain).       Marland Kitchen trimethoprim (TRIMPEX) 100 MG tablet Take 100 mg by mouth daily.       Marland Kitchen warfarin (COUMADIN) 4 MG tablet Take 4 mg by mouth every evening.       Marland Kitchen NITROSTAT 0.4 MG SL tablet Place 0.4 mg under the tongue every 5 (five) minutes as needed for chest pain.         Assessment: 78 yo F on chronic Coumadin for Afib.  Home dose listed above.  INR supra-therapeutic on admission.   She is currently being treated w/ Cipro for UTI.   INR has trended down to goal range today.   No bleeding noted.  Spoke with Dr Sarajane Jews and will continue to hold warfarin for possible loop recorder placement.  Goal of Therapy:  INR 2-3   Plan:  No  Warfarin today. Daily PT/INR. F/U plans for procedure.  Biagio Borg 11/07/2013,11:47 AM

## 2013-11-07 NOTE — Progress Notes (Signed)
DR [facility] NOTIFIED OF PT ARRIVAL

## 2013-11-07 NOTE — Progress Notes (Signed)
PROGRESS NOTE  Andrea [name] VEH:[id number] DOB: September 22, 1935 DOA: 11/05/2013 PCP: Glo Herring., MD  Summary: 78 year old woman with history of atrial fibrillation recently diagnosed tachycardia bradycardia syndrome with plans for loop recorder placement presented with recurrent syncope. She seen by cardiology and transferred to [facility] for electrophysiology evaluation was recommended. Transfer was delayed as  initially the patient was on airborne isolation, however this was discontinued as she has simple shingles without evidence of systemic infection. She is being treated for UTI, culture pending, had reaction to Cipro and thus started on Keflex.   Assessment/Plan: 1. Recurrent syncope, tachycardia bradycardia syndrome superimposed on atrial fibrillation. Transfer plan today as per cardiology. 2. Suspected shingles left ear. Canal appears uninvolved. Rash appears about the same as yesterday. No evidence of systemic infection. Continue valacyclovir. 3. Gram-negative rod UTI. Had allergic reaction to ciprofloxacin. Multiple antibiotic allergies. Allergy to penicillin is rash only. Try cephalosporin.   Discussed with cardiology. Agree with transfer Martyn Malay, cardiology service.  Recommend continuing valacyclovir for simple shingles.  Recommend followup urine culture results and sensitivities. Trial of Keflex. Would favor total 48 hours of treatment left.  Please contact [facility] at [facility] if we can be of assistance.  Murray Hodgkins, MD  [facility]  Pager [phone] If 7PM-7AM, please contact night-coverage at [email or web address], password [facility] 11/07/2013, 3:39 PM  LOS: 2 days   Consultants:  Cardiology   Procedures:    Antibiotics:   Ciprofloxacin 9/16 >> 9/18  Keflex 9/18 >> 9/19  HPI/Subjective:  She is feeling much better, her rash has resolved. Seems to be breathing well. No complaints.  Objective: Filed Vitals:   11/07/13 1127 11/07/13 1210  11/07/13 1240 11/07/13 1443  BP: 120/72 124/82 128/76 126/72  Pulse:  82 86 82  Temp:  97.5 F (36.4 C) 98.2 F (36.8 C) 98.5 F (36.9 C)  TempSrc:  Oral    Resp:  22 20 22   Height:      Weight:      SpO2:  100% 100% 100%    Intake/Output Summary (Last 24 hours) at 11/07/13 1539 Last data filed at 11/07/13 1450  Gross per 24 hour  Intake 2670.83 ml  Output   3001 ml  Net -330.17 ml     Filed Weights   11/05/13 1809 11/05/13 2100 11/05/13 2314  Weight: 48.6 kg (107 lb 2.3 oz) 52.572 kg (115 lb 14.4 oz) 52.572 kg (115 lb 14.4 oz)    Exam:     Afebrile, vital signs are stable. No hypoxia. Gen.  Appears calm, comfortable.  Psych.  Alert.  Cardiovascular.  Regular rate and rhythm. No lower extremity edema. Telemetry shows significant tachycardia at times.  Respiratory.  Clear to auscultation bilaterally. No wheezes, rales or rhonchi. Normal respiratory effort.  Skin.  Rash below last year unchanged.  Data Reviewed:  Basic metabolic panel unremarkable. Urine culture pending, gram-negative rods.  Scheduled Meds: . atorvastatin  10 mg Oral QHS  . benazepril  5 mg Oral Daily  . cephALEXin  500 mg Oral Q12H  . diphenhydrAMINE  25 mg Intravenous Once  . docusate sodium  100 mg Oral BID  . isosorbide mononitrate  30 mg Oral Daily  . metoprolol tartrate  12.5 mg Oral Daily  . mirabegron ER  25 mg Oral Daily  . niacin  500 mg Oral Daily  . nicotine  7 mg Transdermal Daily  . oxybutynin  10 mg Oral QHS  . pantoprazole  40 mg Oral Daily  . prazosin  2 mg Oral QPM  . sodium chloride  3 mL Intravenous Q12H  . valACYclovir  1,000 mg Oral TID  . Warfarin - Pharmacist Dosing Inpatient   Does not apply Q24H   Continuous Infusions: . sodium chloride 50 mL/hr at 11/07/13 2919    Principal Problem:   Syncope Active Problems:   Aphasia   Atrial fibrillation   UTI (lower urinary tract infection)   Essential hypertension   Tobacco abuse   Hyperlipidemia   Rash and  nonspecific skin eruption   Tachy-brady syndrome   Time spent  20 minutes

## 2013-11-07 NOTE — Progress Notes (Signed)
The patient was seen and examined, and I agree with the assessment and plan as documented above, with modifications as noted below.  In summary, this is a 78 yr old patient of Dr. Gwenlyn Found whom he most recently evaluated on 11/04/13, with a history of CVA, chronic atrial fibrillation, HTN, and hyperlipidemia and multiple syncopal episodes.  She presented with two syncopal episodes, one occurring while sitting in a wheelchair and lasting 15 minutes and the other occurred while she was having a bowel movement.  She was noted to be hypotensive upon arrival, hypokalemic, and subsequently found to have a UTI, and is on antimicrobial therapy.  She is on low-dose beta blockade.  A relatively recent event monitor demonstrated evidence for tachycardia-bradycardia syndrome, and Dr. Kennon Holter plan was to refer to Dr. Sallyanne Kuster for loop recorder implantation.  While the second episode may certainly have been vasovagal in etiology (having a BM in addition to having a UTI), the first episode which occurred while sitting in a wheelchair may have been arrhythmic.  Given her multiple recurrent syncopal episodes, I feel she will be best served with an implantable loop recorder. If she were to require pacemaker placement, she will need metoprolol discontinued and washed out beforehand to prove there is no pharmacologic provocation for sinus pauses/arrest.  She is on warfarin and INR will need to be reversed (2.5).  While the patient was initially disagreeable, both Jory Sims NP and myself have spoken to her husband on 9/17 who convinced his wife this is the most prudent course of action. She has had rates in the past 24 hrs as high as 170 bpm range with no significant pauses noted thus far, and currently in 70 bpm range. Once isolation precautions have been removed, patient will likely transfer to [facility].

## 2013-11-07 NOTE — Progress Notes (Signed)
Received a call from Dr. Willeen Cass. He is to be the attending.

## 2013-11-07 NOTE — Plan of Care (Signed)
Pt's husband Ilhan Debenedetto) indicated that pt's daughter Mady Haagensen) in [place].  We have permission to  freely discuss pt's medical care. [phone]

## 2013-11-07 NOTE — Plan of Care (Signed)
Pt appears to be doing much better.  Vitals stable and reddness has left UE and just remains at LE.

## 2013-11-07 NOTE — Plan of Care (Signed)
Family notified of allergic reaction and transfer to [facility] with rm # [id number].  Repport called to [employer], RN and [facility].  Pt vitals stable for transport and [facility] will transfer.
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
## 2013-11-07 NOTE — Progress Notes (Signed)
Spoke with Dr. Stanford Breed about arrival of pt.  Stated he will call his colleague and call back in 20 mins.Marland Kitchen

## 2013-11-08 LAB — URINE CULTURE: Colony Count: >=100000

## 2013-11-08 LAB — PROTIME-INR
INR: 2.31 — ABNORMAL HIGH (ref 0.00–1.49)
Prothrombin Time: 25.4 seconds — ABNORMAL HIGH (ref 11.6–15.2)

## 2013-11-08 MED ORDER — ALPRAZOLAM 0.25 MG PO TABS
0.2500 mg | ORAL_TABLET | Freq: Three times a day (TID) | ORAL | Status: DC | PRN
  Administered 2013-11-08: 0.25 mg via ORAL
  Filled 2013-11-08 (×2): qty 1

## 2013-11-08 MED ORDER — WARFARIN SODIUM 2 MG PO TABS
2.0000 mg | ORAL_TABLET | Freq: Once | ORAL | Status: AC
  Administered 2013-11-08: 2 mg via ORAL
  Filled 2013-11-08: qty 1

## 2013-11-08 NOTE — Progress Notes (Signed)
   Subjective: No CP  No dizziness  No SOB  Objective: Filed Vitals:   11/07/13 1443 11/07/13 1600 11/07/13 2252 11/08/13 0600  BP: 126/72 129/71 154/84 151/78  Pulse: 82 87 82 92  Temp: 98.5 F (36.9 C) 98.1 F (36.7 C) 99.3 F (37.4 C) 98.3 F (36.8 C)  TempSrc:  Axillary Oral Oral  Resp: 22 21 20 20   Height:  5\' 4"  (1.626 m)    Weight:  115 lb 14.4 oz (52.572 kg)    SpO2: 100% 94% 100% 96%   Weight change:   Intake/Output Summary (Last 24 hours) at 11/08/13 0902 Last data filed at 11/08/13 0236  Gross per 24 hour  Intake      0 ml  Output   2000 ml  Net  -2000 ml   Net in/out  Incomplete  General: Alert, awake, oriented x3, in no acute distress Neck:  JVP is normal Heart: Irregular rate and rhythm, without murmurs, rubs, gallops.  Lungs: Clear to auscultation.  No rales or wheezes. Exemities:  No edema.     Tele:  Afib  Rates 80s to 100  No signif pauses   Lab Results: Results for orders placed during the hospital encounter of 11/05/13 (from the past 24 hour(s))  PROTIME-INR     Status: Abnormal   Collection Time    11/08/13  6:05 AM      Result Value Ref Range   Prothrombin Time 25.4 (*) 11.6 - 15.2 seconds   INR 2.31 (*) 0.00 - 1.49    Studies/Results: No results found.  Medications:REviewed   @PROBHOSP @  1.  Syncope  ICA without signif stenoses.  One outpatient monitor with pauses of 2.6 sec.  HR up to 160  See outpatient report  Avg HR in 60s   See below .  Plan for poss looper.   2  .  Atrial fib  Falled TEE cardioversion in past.  Rate cotrol and coumadin. No signif fast or slow rates now.  Keep on current dose of metoprolol.    Coumadin is on hold  INR still increased I have discussed with EP who was not aware of Tx.  WOuld recomm continuing coumadin if plan is just for looper.  WIll give low dose  Check INR.    2   Hx of CP  Patient denies    2.  HTN  Follow  Adequate  Continue benazepril and imdur  3.  Hx CVA  4.  Dementia  5.  HL   Cointinue statin    LOS: 3 days   Dorris Carnes 11/08/2013, 9:02 AM

## 2013-11-09 LAB — PROTIME-INR
INR: 2.28 — ABNORMAL HIGH (ref 0.00–1.49)
PROTHROMBIN TIME: 25.1 s — AB (ref 11.6–15.2)

## 2013-11-09 LAB — URINE MICROSCOPIC-ADD ON

## 2013-11-09 LAB — URINALYSIS, ROUTINE W REFLEX MICROSCOPIC
Bilirubin Urine: NEGATIVE
Glucose, UA: NEGATIVE mg/dL
HGB URINE DIPSTICK: NEGATIVE
KETONES UR: 15 mg/dL — AB
Nitrite: NEGATIVE
PH: 6 (ref 5.0–8.0)
Protein, ur: NEGATIVE mg/dL
SPECIFIC GRAVITY, URINE: 1.009 (ref 1.005–1.030)
Urobilinogen, UA: 0.2 mg/dL (ref 0.0–1.0)

## 2013-11-09 MED ORDER — WARFARIN SODIUM 4 MG PO TABS
4.0000 mg | ORAL_TABLET | Freq: Once | ORAL | Status: AC
  Administered 2013-11-09: 4 mg via ORAL
  Filled 2013-11-09: qty 1

## 2013-11-09 NOTE — Progress Notes (Signed)
ANTICOAGULATION CONSULT NOTE - Follow Up Consult  Pharmacy Consult for Warfarin Indication: atrial fibrillation  Allergies  Allergen Reactions  . Ciprofloxacin Anaphylaxis and Rash  . Codeine   . Contrast Media [Iodinated Diagnostic Agents]     Per pt chart.  . Iodine   . Lovenox [Enoxaparin]   . Nitrofurantoin Other (See Comments)    Numbness in arms and legs  . Nitrofurantoin Monohyd Macro Other (See Comments)  . Penicillins Rash  . Sulfa Antibiotics Rash  . Sulfonamide Derivatives Rash    Patient Measurements: Height: 5\' 4"  (162.6 cm) (58ft4in) Weight: 115 lb 14.4 oz (52.572 kg) IBW/kg (Calculated) : 54.7  Vital Signs: Temp: 98 F (36.7 C) (09/20 0628) Temp src: Oral (09/20 0628) BP: 129/52 mmHg (09/20 0628) Pulse Rate: 104 (09/20 0628)  Labs:  Recent Labs  11/07/13 0700 11/08/13 0605 11/09/13 0705  LABPROT 27.0* 25.4* 25.1*  INR 2.50* 2.31* 2.28*  CREATININE 0.48*  --   --     Estimated Creatinine Clearance: 49.7 ml/min (by C-G formula based on Cr of 0.48).   Medications:  Scheduled:  . atorvastatin  10 mg Oral QHS  . benazepril  5 mg Oral Daily  . cephALEXin  500 mg Oral Q12H  . diphenhydrAMINE  25 mg Intravenous Once  . docusate sodium  100 mg Oral BID  . isosorbide mononitrate  30 mg Oral Daily  . metoprolol tartrate  12.5 mg Oral Daily  . mirabegron ER  25 mg Oral Daily  . niacin  500 mg Oral Daily  . nicotine  7 mg Transdermal Daily  . oxybutynin  10 mg Oral QHS  . pantoprazole  40 mg Oral Daily  . prazosin  2 mg Oral QPM  . sodium chloride  3 mL Intravenous Q12H  . valACYclovir  1,000 mg Oral TID  . warfarin  4 mg Oral ONCE-1800  . Warfarin - Pharmacist Dosing Inpatient   Does not apply Q24H   Infusions:  . sodium chloride 50 mL/hr at 11/09/13 0201   Assessment: Andrea [name] is a 33 yof who presented to [facility] with a syncopal episode.  She has a history of hypertension, MCA CVA in 04/2011, and atrial fibrillation (arfarin prior to  admission).  Warfarin was continued 9/16 on 4 mg, slightly supratherapeutic, but then held for possible pacemaker or loop recorder.  Per MD, may resume warfarin if plan is just for looper.  Current INR is therapeutic at 2.28.  I anticipate the INR to continue to drop more.  H/H & plt are wnl.  No significant drug drug interactions and no overt s/sx of bleeding.    Goal of Therapy:  INR 2-3   Plan:  - Warfarin 4 mg x 1 tonight - Daily INR - Monitor for s/sx of bleeding  Hassie Bruce, Pharm. D. Clinical Pharmacy Resident Pager: [phone] Ph: [phone] 11/09/2013 10:04 AM

## 2013-11-09 NOTE — Consult Note (Signed)
EP consult note   Reason for Consult:syncope  Referring Physician: Dr. Evelene Croon is an 78 y.o. female.   HPI: The patient is a 78 yo woman with multiple medical problems who was transferred to the hospital from [facility] for ongoing evaluation of syncope. There is no one with the patient and her ability to provide any history is limited. I do not know what her baseline is. She has chronic atrial fib and has worn a monitor demonstrating both tachy and bradycardia. She has a h/o two syncope episodes although she cannot give me any details. She is fixated on her skin cancer. The patient has had documented heart rates in atrial fib of over 160/min and bradycardia, ?nocturnal with pauses of up to 2.6 seconds. I have reviewed the details of her syncope, previously provided. The patient cannot give me any details this morning.   PMH: Past Medical History  Diagnosis Date  . Hypertension   . Stroke   . Acid reflux   . Atrial fibrillation     tachybradycardia syndrome  . Cancer     facial  . Tobacco abuse 04/30/2011  . Cardiomyopathy 05/02/2011    EF 45-50%, per Echo.  . 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 Carotid artery stenosis 05/02/2011  . CAD (coronary artery disease) 07/20/2006    R/S MV - mild perfusion defect in apical region consistent w/ infarct/scar vs apical thinning; no scintigraphic evidence of inducible myocardial ischemia; baseline EKG demonstrates A Flutter w/ variable AV conduction  . Hyperlipidemia   . Chest pain   . Cerebral atherosclerosis 12/11/2011    Carotid duplex - R ICA 0-49% diameter reduction, velocities suggest high end; L ICA 0-49% reduction, velocities suggest mid range  . Neuropathy   . Syncope     PSHX: Past Surgical History  Procedure Laterality Date  . Cholecystectomy    . Skin cancer excision    . Tonsillectomy    . Tee with cardioversion      failed in past but dates not given    FAMHX: Family History  Problem Relation Age of  Onset  . Adopted: Yes    Social History:  reports that she has been smoking Cigarettes.  She has a 20 pack-year smoking history. She has never used smokeless tobacco. She reports that she does not drink alcohol or use illicit drugs.  Allergies:  Allergies  Allergen Reactions  . Ciprofloxacin Anaphylaxis and Rash  . Codeine   . Contrast Media [Iodinated Diagnostic Agents]     Per pt chart.  . Iodine   . Lovenox [Enoxaparin]   . Nitrofurantoin Other (See Comments)    Numbness in arms and legs  . Nitrofurantoin Monohyd Macro Other (See Comments)  . Penicillins Rash  . Sulfa Antibiotics Rash  . Sulfonamide Derivatives Rash    Medications: reviewed  No results found.  ROS  As stated in the HPI and negative for all other systems. Patient is not a good historian.  Physical Exam  Vitals:Blood pressure 129/52, pulse 104, temperature 98 F (36.7 C), temperature source Oral, resp. rate 21, height 5\' 4"  (1.626 m), weight 115 lb 14.4 oz (52.572 kg), SpO2 96.00%.  Stable but chronically ill appearing NAD HEENT: Unremarkable Neck:  No JVD, no thyromegally Back:  No CVA tenderness Lungs:  Clear with no wheezes, marked pectus excavatum HEART:  IRegular rate rhythm, no murmurs, no rubs, no clicks Abd:  soft, positive bowel sounds, no organomegally, no rebound, no  guarding Ext:  2 plus pulses, no edema, no cyanosis, no clubbing Skin:  No rashes no nodules Neuro:  CN II through XII intact, motor grossly intact  Assessment/Plan: 1. Syncope - the etiology is unclear. She is a very poor historian. It would be reasonable to place an ILR. Will plan to do this tomorrow morning. No need to hold anti-coagulation. Will try and contact family. I am not convinced her tachy-brady is the cause of her syncope.  2. Atrial fib - her ventricular rate is in the low 100-110 range. I think she will continue her current level of rate control. With her h/o bradycardia, I would not uptitrate her beta blocker.   3. Neuro - not clear what her baseline is. When I saw her she was sleeping and slurred her speech and was quite lethargic and really could not answer questions appropriately. Will hold all sedating meds.  Gregg Taylor,M.D.  Carleene Overlie TaylorMD 11/09/2013, 10:33 AM

## 2013-11-09 NOTE — Progress Notes (Signed)
   Subjective: No dizziness  Say she feels drunck   Objective: Filed Vitals:   11/08/13 0600 11/08/13 1408 11/08/13 2112 11/09/13 0628  BP: 151/78 127/61 159/78 129/52  Pulse: 92 95 99 104  Temp: 98.3 F (36.8 C) 98.4 F (36.9 C) 98.2 F (36.8 C) 98 F (36.7 C)  TempSrc: Oral Oral Oral Oral  Resp: 20 24 20 21   Height:      Weight:      SpO2: 96% 98% 97% 96%   Weight change:   Intake/Output Summary (Last 24 hours) at 11/09/13 1137 Last data filed at 11/09/13 0631  Gross per 24 hour  Intake 2947.5 ml  Output    800 ml  Net 2147.5 ml    General: Alert, awake, oriented x3, in no acute distress Neck:  JVP is normal  Small mobile mass L midline.  Nontender.   Heart: Irregular rate and rhythm, without murmurs, rubs, gallops.  Lungs: Clear to auscultation.  No rales or wheezes. Exemities:  No edema.   Neuro:A and O x 3.  Otherwise deferred.    Tele:  Afib  Rates on avg less than 100.    Lab Results: Results for orders placed during the hospital encounter of 11/05/13 (from the past 24 hour(s))  PROTIME-INR     Status: Abnormal   Collection Time    11/09/13  7:05 AM      Result Value Ref Range   Prothrombin Time 25.1 (*) 11.6 - 15.2 seconds   INR 2.28 (*) 0.00 - 1.49    Studies/Results: No results found.  Medications: REviewed   @PROBHOSP @  LOS: 4 days   Dorris Carnes 11/09/2013, 11:37 AM  Patient seen  Exam unchanged from yesterday Husband available now.  Says she has had several episodes of syncope this year  Most associated with going to [place].  This last spell she was sitting in wheelchair (wheelchair bound)  Says she felt light headed.  Weak.  He tried to get her to bed  She passed out.  He has noticed more confusion over the past few months.  He cares for her at home   This confusion has not been addressed by Dr Gerarda Fraction.     1.  Syncope  Continue tele.  Apprec input of EP  Plan for loop tomorrow.    2.  COnfusion.  CT without acute findings.  WIll need to have  eval as outpatient.  Husband needs support.    3  UTI  Repeat UA  On ABX

## 2013-11-10 ENCOUNTER — Encounter (HOSPITAL_COMMUNITY)
Admission: EM | Disposition: A | Discharge status: Home / Self Care | Payer: Self-pay | Source: Home / Self Care | Attending: Cardiovascular Disease

## 2013-11-10 DIAGNOSIS — I428 Other cardiomyopathies: Secondary | ICD-10-CM

## 2013-11-10 DIAGNOSIS — R0789 Other chest pain: Secondary | ICD-10-CM

## 2013-11-10 LAB — BASIC METABOLIC PANEL
ANION GAP: 17 — AB (ref 5–15)
BUN: 8 mg/dL (ref 6–23)
CO2: 20 mEq/L (ref 19–32)
Calcium: 8.3 mg/dL — ABNORMAL LOW (ref 8.4–10.5)
Chloride: 101 mEq/L (ref 96–112)
Creatinine, Ser: 0.5 mg/dL (ref 0.50–1.10)
GFR calc Af Amer: >90 mL/min (ref >90)
Glucose, Bld: 71 mg/dL (ref 70–99)
POTASSIUM: 3.4 meq/L — AB (ref 3.7–5.3)
SODIUM: 138 meq/L (ref 137–147)

## 2013-11-10 LAB — CULTURE, BLOOD (ROUTINE X 2)
CULTURE: NO GROWTH
Culture: NO GROWTH

## 2013-11-10 LAB — TROPONIN I
Troponin I: <0.3 ng/mL (ref <0.30)
Troponin I: <0.3 ng/mL (ref <0.30)

## 2013-11-10 LAB — PROTIME-INR
INR: 2.84 — AB (ref 0.00–1.49)
Prothrombin Time: 29.8 seconds — ABNORMAL HIGH (ref 11.6–15.2)

## 2013-11-10 SURGERY — LOOP RECORDER IMPLANT
Anesthesia: LOCAL

## 2013-11-10 MED ORDER — WARFARIN SODIUM 2 MG PO TABS
2.0000 mg | ORAL_TABLET | Freq: Once | ORAL | Status: AC
  Administered 2013-11-10: 2 mg via ORAL
  Filled 2013-11-10: qty 1

## 2013-11-10 MED ORDER — ASPIRIN 325 MG PO TABS
325.0000 mg | ORAL_TABLET | Freq: Every day | ORAL | Status: DC
  Administered 2013-11-10: 325 mg via ORAL
  Filled 2013-11-10 (×2): qty 1

## 2013-11-10 NOTE — Progress Notes (Signed)
Pt complaining of left sided chest pain 9/10. STAT EKG performed showed A. Fib with RVR. MD on call paged. Nitro given 0.4 x 3 doses with some relief. MD came to see pt and put in orders for lab work. Will continue to monitor pt.

## 2013-11-10 NOTE — Progress Notes (Signed)
Patient Name: Andrea [name]      SUBJECTIVE:* Denies shortness of breath and chest pain  Past Medical History  Diagnosis Date  . Hypertension   . Stroke   . Acid reflux   . Atrial fibrillation     tachybradycardia syndrome  . Cancer     facial  . Tobacco abuse 04/30/2011  . Cardiomyopathy 05/02/2011    EF 45-50%, per Echo.  . 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 Carotid artery stenosis 05/02/2011  . CAD (coronary artery disease) 07/20/2006    R/S MV - mild perfusion defect in apical region consistent w/ infarct/scar vs apical thinning; no scintigraphic evidence of inducible myocardial ischemia; baseline EKG demonstrates A Flutter w/ variable AV conduction  . Hyperlipidemia   . Chest pain   . Cerebral atherosclerosis 12/11/2011    Carotid duplex - R ICA 0-49% diameter reduction, velocities suggest high end; L ICA 0-49% reduction, velocities suggest mid range  . Neuropathy   . Syncope     Scheduled Meds:  Scheduled Meds: . aspirin  325 mg Oral Daily  . atorvastatin  10 mg Oral QHS  . benazepril  5 mg Oral Daily  . cephALEXin  500 mg Oral Q12H  . diphenhydrAMINE  25 mg Intravenous Once  . docusate sodium  100 mg Oral BID  . isosorbide mononitrate  30 mg Oral Daily  . metoprolol tartrate  12.5 mg Oral Daily  . mirabegron ER  25 mg Oral Daily  . niacin  500 mg Oral Daily  . nicotine  7 mg Transdermal Daily  . oxybutynin  10 mg Oral QHS  . pantoprazole  40 mg Oral Daily  . prazosin  2 mg Oral QPM  . sodium chloride  3 mL Intravenous Q12H  . valACYclovir  1,000 mg Oral TID  . Warfarin - Pharmacist Dosing Inpatient   Does not apply Q24H   Continuous Infusions:  diphenhydrAMINE, nitroGLYCERIN, ondansetron (ZOFRAN) IV, ondansetron, traMADol-acetaminophen    PHYSICAL EXAM Filed Vitals:   11/10/13 0232 11/10/13 0233 11/10/13 0506 11/10/13 1422  BP: 135/58  131/71 132/87  Pulse: 99 117 107 98  Temp:   97.9 F (36.6 C) 98.2 F (36.8 C)    TempSrc:   Oral Axillary  Resp:   20 18  Height:      Weight:      SpO2: 97%  97% 97%   .eps Cachectic female restrained in bed HENT normal Neck supple   Clear Irregularly irregular Abd-soft with active BS No Clubbing cyanosis  Skin-warm and dry A & Oriented X1 Grossly normal sensory and motor function   TELEMETRY: Reviewed telemetry pt in afib    Intake/Output Summary (Last 24 hours) at 11/10/13 1706 Last data filed at 11/10/13 1423  Gross per 24 hour  Intake      0 ml  Output   1550 ml  Net  -1550 ml    LABS: Basic Metabolic Panel:  Recent Labs Lab 11/05/13 1455 11/06/13 0305 11/07/13 0700 11/10/13 0430  NA 141 140 140 138  K 3.2* 4.0 3.7 3.4*  CL 101 106 104 101  CO2 26 23 23 20   GLUCOSE 191* 91 83 71  BUN 9 8 6 8   CREATININE 0.66 0.51 0.48* 0.50  CALCIUM 8.7 8.0* 8.6 8.3*   Cardiac Enzymes:  Recent Labs  11/10/13 0430 11/10/13 1030  TROPONINI <0.30 <0.30   CBC:  Recent Labs Lab 11/05/13 1455 11/06/13 0305  WBC 5.1 5.2  NEUTROABS 3.6  --   HGB 13.7 13.1  HCT 40.8 38.1  MCV 97.6 95.7  PLT 138* 144*   PROTIME:  Recent Labs  11/08/13 0605 11/09/13 0705 11/10/13 0430  LABPROT 25.4* 25.1* 29.8*  INR 2.31* 2.28* 2.84*   Liver Function Tests: No results found for this basename: AST, ALT, ALKPHOS, BILITOT, PROT, ALBUMIN,  in the last 72 hours No results found for this basename: LIPASE, AMYLASE,  in the last 72 hours BNP: BNP (last 3 results)  Recent Labs  11/05/13 1717  PROBNP 5769.0*     ASSESSMENT AND PLAN:  Principal Problem:   Syncope Active Problems:   Aphasia   Atrial fibrillation   UTI (lower urinary tract infection)   Essential hypertension   Tobacco abuse   Hyperlipidemia   Rash and nonspecific skin eruption   Tachy-brady syndrome  The patient has a history of syncope. According to one of the nurses is frequently related to standing. It is not clear to me how ambulatory she is at she is currently restrained  confused  While it is reasonable to consider loop recorder insertion for recurrent syncope, but she is not really ambulatory it probably is not reasonable. Furthermore, based on nursing observation, will undertake orthostatic vital signs and see if this is not illuminating.  Based on these results, or perhaps even empirically, an abdominal binder might be quite useful based on the history and the fact that it would be treatment it does not have pharmacological side effects  Call us if  we can be of further assistance  Signed, Virl Axe MD  11/10/2013

## 2013-11-10 NOTE — Progress Notes (Addendum)
Patient ID: Andrea [name], female   DOB: [birth year], 78 y.o.   MRN: [id number]  Subjective: O/n - afib RVR with CP - no acute findings on EKG.  Troponin levels being checked. RN notes significant PM anxiety & confusion (worse when husband is not present) - No sedation medication ordered due to lethargy This AM: No CP  No dizziness  No SOB    Objective: Filed Vitals:   11/09/13 2227 11/10/13 0232 11/10/13 0233 11/10/13 0506  BP: 144/70 135/58  131/71  Pulse: 78 99 117 107  Temp: 98.2 F (36.8 C)   97.9 F (36.6 C)  TempSrc: Oral   Oral  Resp: 18   20  Height:      Weight:      SpO2: 92% 97%  97%   Weight change:   Intake/Output Summary (Last 24 hours) at 11/10/13 1007 Last data filed at 11/10/13 0900  Gross per 24 hour  Intake      0 ml  Output   1550 ml  Net  -1550 ml   Net in/out  Incomplete  General: Alert, awake, oriented to person (talking about granddaughter charging things to her room & concern about a wedding), in no acute distress; thin & frail Neck:  JVP is normal Heart: Irregular rhythm & rapid rate , without murmurs, rubs, gallops.  Lungs: Clear to auscultation.  No rales or wheezes. Exemities:  No edema.     Tele:  Afib  Rates 80s to 100  No signif pauses   Lab Results: Results for orders placed during the hospital encounter of 11/05/13 (from the past 24 hour(s))  URINALYSIS, ROUTINE W REFLEX MICROSCOPIC     Status: Abnormal   Collection Time    11/09/13 12:06 PM      Result Value Ref Range   Color, Urine YELLOW  YELLOW   APPearance CLOUDY (*) CLEAR   Specific Gravity, Urine 1.009  1.005 - 1.030   pH 6.0  5.0 - 8.0   Glucose, UA NEGATIVE  NEGATIVE mg/dL   Hgb urine dipstick NEGATIVE  NEGATIVE   Bilirubin Urine NEGATIVE  NEGATIVE   Ketones, ur 15 (*) NEGATIVE mg/dL   Protein, ur NEGATIVE  NEGATIVE mg/dL   Urobilinogen, UA 0.2  0.0 - 1.0 mg/dL   Nitrite NEGATIVE  NEGATIVE   Leukocytes, UA TRACE (*) NEGATIVE  URINE MICROSCOPIC-ADD ON      Status: Abnormal   Collection Time    11/09/13 12:06 PM      Result Value Ref Range   Squamous Epithelial / LPF MANY (*) RARE   WBC, UA 7-10  <3 WBC/hpf   Bacteria, UA RARE  RARE  PROTIME-INR     Status: Abnormal   Collection Time    11/10/13  4:30 AM      Result Value Ref Range   Prothrombin Time 29.8 (*) 11.6 - 15.2 seconds   INR 2.84 (*) 0.00 - 9.45  BASIC METABOLIC PANEL     Status: Abnormal   Collection Time    11/10/13  4:30 AM      Result Value Ref Range   Sodium 138  137 - 147 mEq/L   Potassium 3.4 (*) 3.7 - 5.3 mEq/L   Chloride 101  96 - 112 mEq/L   CO2 20  19 - 32 mEq/L   Glucose, Bld 71  70 - 99 mg/dL   BUN 8  6 - 23 mg/dL   Creatinine, Ser 0.50  0.50 - 1.10 mg/dL  Calcium 8.3 (*) 8.4 - 10.5 mg/dL   GFR calc non Af Amer >90  >90 mL/min   GFR calc Af Amer >90  >90 mL/min   Anion gap 17 (*) 5 - 15  TROPONIN I     Status: None   Collection Time    11/10/13  4:30 AM      Result Value Ref Range   Troponin I <0.30  <0.30 ng/mL   Tele: Afib: rates high 90s-110s (was ~120s overnight with anxiety) Studies/Results: No results found.  Medications:REviewed Principal Problem:   Syncope Active Problems:   Aphasia   Atrial fibrillation   UTI (lower urinary tract infection)   Essential hypertension   Tobacco abuse   Hyperlipidemia   Rash and nonspecific skin eruption   Tachy-brady syndrome  1.  Syncope  ICA without signif stenoses.  One outpatient monitor with pauses of 2.6 sec - none noted here.  HR up to 160  See outpatient report  Avg HR in 60s; inpt rates ~100s-120s  See below .  Plan for poss looper.   2  .  Atrial fib  Falled TEE cardioversion in past.  Rate cotrol and coumadin.  Agree with EP that with current rates, would not increase standing BB dose (given concern to Bradycardic syncope) -- consider PRN BB dose for RVR.  No signif fast or slow rates now.  Keep on current dose of metoprolol.     Coumadin is on hold  INR still increased (will see if EP is  still planning Loop Recorder today - should be OK with therapeutic INR)  2   Hx of CP  Patient denies at present, but did have o/n when in RVR.  Follow troponin levels.  2.  HTN  Follow  Adequate  Continue benazepril and imdur with low dose BB  3.  Hx CVA  4.  Dementia  5.  HL  Cointinue statin    6.  UTI - on Keflex - continue for 5 day course (started on 9/20 - continue til 9/25, will need OP Rx)  Per d/w the patient's husband, they are hoping for rapid d/c in order avoid further issues like what happened last PM (baseline dementia with sundowning & confusion) If possible, consider d/c following ILR today, vs. D/c in AM.  If staying o/n - may be better served on Tele floor    LOS: 5 days   Thomasa Heidler W 11/10/2013, 10:07 AM

## 2013-11-10 NOTE — Progress Notes (Signed)
CARE MANAGEMENT NOTE 11/10/2013  Patient:  Andrea [name], Andrea [name]   Account Number:  [id number]  Date Initiated:  11/10/2013  Documentation initiated by:  Tomi Bamberger  Subjective/Objective Assessment:   dx syncope  admit- lives with spouse.     Action/Plan:   Anticipated DC Date:  11/11/2013   Anticipated DC Plan:  [place]  CM consult      Choice offered to / List presented to:             Status of service:  In process, will continue to follow Medicare Important Message given?  YES (If response is "NO", the following Medicare IM given date fields will be blank) Date Medicare IM given:  11/10/2013 Medicare IM given by:  Tomi Bamberger Date Additional Medicare IM given:   Additional Medicare IM given by:    Discharge Disposition:    Per UR Regulation:  Reviewed for med. necessity/level of care/duration of stay  If discussed at [place] of Stay Meetings, dates discussed:    Comments:

## 2013-11-10 NOTE — Progress Notes (Addendum)
ANTICOAGULATION CONSULT NOTE - Follow Up Consult  Pharmacy Consult for coumadin Indication: atrial fibrillation  Allergies  Allergen Reactions  . Ciprofloxacin Anaphylaxis and Rash  . Codeine   . Contrast Media [Iodinated Diagnostic Agents]     Per pt chart.  . Iodine   . Lovenox [Enoxaparin]   . Nitrofurantoin Other (See Comments)    Numbness in arms and legs  . Nitrofurantoin Monohyd Macro Other (See Comments)  . Penicillins Rash  . Sulfa Antibiotics Rash  . Sulfonamide Derivatives Rash    Patient Measurements: Height: 5\' 4"  (162.6 cm) (86ft4in) Weight: 115 lb 14.4 oz (52.572 kg) IBW/kg (Calculated) : 54.7   Vital Signs: Temp: 97.9 F (36.6 C) (09/21 0506) Temp src: Oral (09/21 0506) BP: 131/71 mmHg (09/21 0506) Pulse Rate: 107 (09/21 0506)  Labs:  Recent Labs  11/08/13 0605 11/09/13 0705 11/10/13 0430  LABPROT 25.4* 25.1* 29.8*  INR 2.31* 2.28* 2.84*  CREATININE  --   --  0.50  TROPONINI  --   --  <0.30    Estimated Creatinine Clearance: 49.7 ml/min (by C-G formula based on Cr of 0.5).  Assessment: Patient is a 78 y.o F on coumadin for Afib with plan for loop recorder placement.  Cards note indicated that coumadin should be on hold.  However, pharmacy gave two doses of coumadin this weekend (Dr. Selena Batten informed).  INR now up to 2.84.    Goal of Therapy:  INR 2-3    Plan:  - hold coumadin for now - Notify pharmacy if/when coumadin can be resumed for patient after procedure  Pham, Anh P 11/10/2013,10:57 AM  PM Addendum EP decided to not place loop recorder.  Per Dr Tanna Furry note over the weekend - no need to hold [facility]. Will give small dose of warfarin tonight as patient seems to be sensitive to small dose changes.    Coumadin 2mg  x1 Daily INR  Bonnita Nasuti Pharm.D. CPP, BCPS Clinical Pharmacist [phone] 11/10/2013 5:47 PM

## 2013-11-10 NOTE — Progress Notes (Signed)
I was called to evaluate Andrea [name] for chest pain.  She reports substernal chest tightness that began suddenly while lying in the bed.  She remains in atrial fibrillation rate 117, BP 135/58.  She received sl NTG x1 without relief.  EKG is unchanged from prior (AF, LAFB, anterior Q waves).  Will check troponin and give ASA 324mg .  Avoid morphine given her baseline dementia.

## 2013-11-11 ENCOUNTER — Encounter (HOSPITAL_COMMUNITY): Payer: Self-pay | Admitting: Cardiology

## 2013-11-11 DIAGNOSIS — I951 Orthostatic hypotension: Secondary | ICD-10-CM

## 2013-11-11 DIAGNOSIS — R21 Rash and other nonspecific skin eruption: Secondary | ICD-10-CM

## 2013-11-11 DIAGNOSIS — Z7901 Long term (current) use of anticoagulants: Secondary | ICD-10-CM

## 2013-11-11 HISTORY — DX: Long term (current) use of anticoagulants: Z79.01

## 2013-11-11 HISTORY — DX: Orthostatic hypotension: I95.1

## 2013-11-11 LAB — URINALYSIS, ROUTINE W REFLEX MICROSCOPIC
Bilirubin Urine: NEGATIVE
GLUCOSE, UA: NEGATIVE mg/dL
HGB URINE DIPSTICK: NEGATIVE
Ketones, ur: 15 mg/dL — AB
Nitrite: NEGATIVE
PH: 6.5 (ref 5.0–8.0)
Protein, ur: NEGATIVE mg/dL
SPECIFIC GRAVITY, URINE: 1.003 — AB (ref 1.005–1.030)
UROBILINOGEN UA: 0.2 mg/dL (ref 0.0–1.0)

## 2013-11-11 LAB — URINE MICROSCOPIC-ADD ON

## 2013-11-11 LAB — TROPONIN I: Troponin I: <0.3 ng/mL (ref <0.30)

## 2013-11-11 LAB — PROTIME-INR
INR: 4.17 — AB (ref 0.00–1.49)
Prothrombin Time: 40.3 seconds — ABNORMAL HIGH (ref 11.6–15.2)

## 2013-11-11 MED ORDER — CEPHALEXIN 500 MG PO CAPS: 500.0000 mg | ORAL_CAPSULE | Freq: Two times a day (BID) | ORAL | Status: AC

## 2013-11-11 MED ORDER — INFLUENZA VAC SPLIT QUAD 0.5 ML IM SUSY: 0.5000 mL | PREFILLED_SYRINGE | INTRAMUSCULAR | Status: DC

## 2013-11-11 MED ORDER — DSS 100 MG PO CAPS: 100.0000 mg | ORAL_CAPSULE | Freq: Two times a day (BID) | ORAL | Status: AC

## 2013-11-11 MED ORDER — WARFARIN SODIUM 4 MG PO TABS: 4.0000 mg | ORAL_TABLET | Freq: Every evening | ORAL | Status: DC

## 2013-11-11 MED ORDER — POLYETHYLENE GLYCOL 3350 17 G PO PACK: 17.0000 g | PACK | Freq: Every day | ORAL | Status: AC

## 2013-11-11 MED ORDER — WHITE PETROLATUM GEL
Status: AC
  Administered 2013-11-11: 0.2
  Filled 2013-11-11: qty 5

## 2013-11-11 MED ORDER — POLYETHYLENE GLYCOL 3350 17 G PO PACK
17.0000 g | PACK | Freq: Every day | ORAL | Status: DC
  Filled 2013-11-11: qty 1

## 2013-11-11 MED ORDER — VALACYCLOVIR HCL 1 G PO TABS: 1000.0000 mg | ORAL_TABLET | Freq: Three times a day (TID) | ORAL | Status: AC

## 2013-11-11 NOTE — Progress Notes (Signed)
Subjective: No complaints  Objective: Vital signs in last 24 hours: Temp:  [97.5 F (36.4 C)-98.2 F (36.8 C)] 97.8 F (36.6 C) (09/22 0541) Pulse Rate:  [84-98] 84 (09/22 0541) Resp:  [18] 18 (09/22 0541) BP: (91-132)/(42-87) 122/68 mmHg (09/22 0541) SpO2:  [94 %-97 %] 94 % (09/22 0541) Weight change:  Last BM Date: 11/07/13 Intake/Output from previous day: -400 09/21 0701 - 09/22 0700 In: 0  Out: 400 [Stool:400] Intake/Output this shift:   Orthostatic BPs Lying 131/90    Sitting 125/72     Standing  107/59   PE: General:Pleasant affect, NAD, still says things inappropriately  Skin:Warm and dry, brisk capillary refill HEENT:normocephalic, sclera clear, mucus membranes moist, several small ulcers around ear, no redness  Neck:supple, no JVD  Heart:irreg irreg without murmur, gallup, rub or click Lungs:clear without rales, rhonchi, or wheezes NWG:NFAO, non tender, + BS, do not palpate liver spleen or masses Ext:no lower ext edema,  Neuro:alert and oriented, MAE, follows commands, + facial symmetry Tele-  HR at times in the 40s to low of 41, other times in 120s atrial fib    Lab Results: No results found for this basename: WBC, HGB, HCT, PLT,  in the last 72 hours BMET  Recent Labs  11/10/13 0430  NA 138  K 3.4*  CL 101  CO2 20  GLUCOSE 71  BUN 8  CREATININE 0.50  CALCIUM 8.3*    Recent Labs  11/10/13 1800 11/11/13 0005  TROPONINI <0.30 <0.30    Lab Results  Component Value Date   CHOL 82 04/29/2011   HDL 29* 04/29/2011   LDLCALC 40 04/29/2011   TRIG 66 04/29/2011   CHOLHDL 2.8 04/29/2011   Lab Results  Component Value Date   HGBA1C 5.6 04/29/2011     Lab Results  Component Value Date   TSH 0.512 11/05/2013    Studies/Results: No results found.  Medications: I have reviewed the patient's current medications. Scheduled Meds: . aspirin  325 mg Oral Daily  . atorvastatin  10 mg Oral QHS  . cephALEXin  500 mg Oral Q12H  . docusate sodium   100 mg Oral BID  . [START ON 11/12/2013] Influenza vac split quadrivalent PF  0.5 mL Intramuscular Tomorrow-1000  . isosorbide mononitrate  30 mg Oral Daily  . metoprolol tartrate  12.5 mg Oral Daily  . mirabegron ER  25 mg Oral Daily  . niacin  500 mg Oral Daily  . nicotine  7 mg Transdermal Daily  . oxybutynin  10 mg Oral QHS  . pantoprazole  40 mg Oral Daily  . prazosin  2 mg Oral QPM  . sodium chloride  3 mL Intravenous Q12H  . valACYclovir  1,000 mg Oral TID  . Warfarin - Pharmacist Dosing Inpatient   Does not apply Q24H   Continuous Infusions:  PRN Meds:.diphenhydrAMINE, nitroGLYCERIN, ondansetron (ZOFRAN) IV, ondansetron, traMADol-acetaminophen  Assessment/Plan: Principal Problem:   Syncope- may be orthostatic, with BM was vasovagel, while sitting in chair possibility due to arrythmia, She is orthostatic, EP recommends abd binder - we can try to see if she tolerates may be difficult with dementia.   Active Problems:   Atrial fibrillation, permanent    Tachy-brady syndrome- continues to tachy brady from 41 to 125   Chronic anticoagulation- INR today is 4.17 hold coumadin til less elevated hold for 2 days then check with her PCP who follow her INR    Aphasia   UTI (  lower urinary tract infection)- on keflex, she has foley placed for urinary retention, will d/c foley and see how she does. Keflex for 7 days.   Essential hypertension- BP down to 91/42 last pm now 122/68 - with hypotension will stop ACE and imdur.   Tobacco abuse- recommended cessation    Hyperlipidemia   Rash and nonspecific skin eruption- thought to be shingles- on Valtrex will do for 7 days.     Orthostatic hypotension - added abd binder, pt does not walk but sits for length of time in wheelchair.     LOS: 6 days   Time spent with pt. :15 minutes. [facility] R  Nurse Practitioner Certified Pager [phone] or after 5pm and on weekends call [phone] 11/11/2013, 9:04 AM   Pt seen & examined along with Ms.  Ingold, NP-C.  I agree with her findings, exam & recommendations.  No Loop Recorder per Dr. Caryl Comes. Was orthostatic -- d/c imdur & ARB, consider abdominal binder  Debilitated -- CM c/s for ? HHRN. ? Shingles - complete 7 d Valtrex UTI - 7 d Keflex -- will need to adjust warfarin dosing while on ABx - probably hold x 2 days) --needs close OP f/u.  Plan is d/c today - needs f/u with Dr. Gwenlyn Found & EP.  Leonie Man, M.D., M.S. Interventional Cardiologist   Pager # [phone]

## 2013-11-11 NOTE — Discharge Summary (Signed)
Transferred to [facility] for recurrent syncope.  Initial plan per EP Consult was Loop recorder, but not done per Dr. Caryl Comes.   Orthostatic hypotension changes noted => d/c nitrate & ACE-I to allow permissive HTN.  UTI treated with Keflex - complete 7 day course Possible Zoster rx with Valvacyclovir - 7 days.  INR elevated (? Related to Abx) -- holding doses per Pharmacy.  Needs close f/u.  Will have her f/u with Dr. Lovena Le in [place] as well as Dr. Gwenlyn Found in [place].  Agree with d/c summary.  Leonie Man, M.D., M.S. Interventional Cardiologist   Pager # [phone]

## 2013-11-11 NOTE — Discharge Summary (Signed)
Physician Discharge Summary       Patient ID: Andrea [name] MRN: [id number] DOB/AGE: 1935/03/17 78 y.o.  Admit date: 11/05/2013 Discharge date: 11/11/2013  Discharge Diagnoses:  Principal Problem:   Syncope Active Problems:   Atrial fibrillation, permanent    Tachy-brady syndrome   Chronic anticoagulation   Orthostatic hypotension   Aphasia   UTI (lower urinary tract infection)   Essential hypertension   Tobacco abuse   Hyperlipidemia   Rash and nonspecific skin eruption   Discharged Condition: fair  Procedures: none  Hospital Course:  78 y.o.female patient of Dr. Gwenlyn Found, with known history chronic atrial fib, on coumadin; hypertension, MCA CVA in 04/2011, with residual dysarthria and right sided weakness. Most recent office visit with Dr. Gwenlyn Found on 11/04/2013 state that she had several syncopal episodes with one month event monitor showing tachy-brady syndrome with pauses up to 2.6 seconds, and HR's up to 130 bpm. She was continued on lopressor 12.5 mg BID and referred to Dr. Orene Desanctis for loop recorder placement though this had not yet been completed.   She presented to the ER 11/06/13 with another syncopal episode while sitting in her wheel chair, with LOC for approx 15 minutes per note, with a recurrent episode while on the toilet. (Pt does not walk but goes from bed to wheelchair with help)  EMS was called. In ER,she was found to be hypotensive, HR 81 bpm, atrial fib, BP 80/52. INR was 3.19, Pro-BNP 5,769. Potassium 3.2 Glucose 191, Albumin 3.4. Troponin <30.0 Found to have UTI.   CT scan was negative for acute intracranial abnormalities. . No acute hemorrhage, infarct, or mass lesion is identified. No midline shift. Orbits and paranasal sinuses are within normal limits.She was treated with IV fluids, vancomycin, Levoquin, and Valtrex in the setting of rash on her right ear thought to be shingles.   She was seen by Dr Bronson Ing in consult and transferred to [facility] for EP eval to  decide if loop recorder or PPM warranted.       Syncope- seen by Dr. Lovena Le and Dr. Caryl Comes, the etiology is unclear.  Originally Dr Lovena Le ILR was reasonable though he believed her tachy-brady may not be cause of the syncope.   The next day she was seen by Dr. Caryl Comes and he felt hypotension most likely cause of her syncope and recommenced abdominal binder.  She is discharged with binder and her benazepril and imdur were stopped as pt continued with episodes of hypotension in the hospital.  She will follow up with Dr. Lovena Le in December in [place].     Atrial fibrillation, permanent - she had been placed on ASA in hospital this was stopped.  HR varied from 41- 125    Tachy-brady syndrome- HR varied from 41-125 mostly higher, we recommended for HR > 130 to take extra 12.5 mg lopressor once daily.    Chronic anticoagulation- INR 4.17 on day of discharge, holding coumadin 11/11/13 and 11/12/13 then follow up with Dr. Gerarda Fraction to eval INR and further instructions.    Orthostatic hypotension- pt orthostatic here and at times just hypotensive.  We stopped benazepril and imdur.  Will need outpt monitoring.  Abdominal binder added.    Aphasia-chronic    UTI (lower urinary tract infection)and urinary retention/ at home incontinence to complete 7 day course of keflex    Essential hypertension see above now with hypotension    Tobacco abuse- asked to stop    Hyperlipidemia- continue meds    Rash and nonspecific skin  eruption- shingles- complete Valtrex for 7 days. Symptoms improved at discharge.  Prior to discharge foley removed and follow up ua sent --results pending.  Abdominal binder added. Pt will follow up with Dr. Gerarda Fraction and Dr. Kennon Holter APP.  Pt was seen and evaluated by Dr. Ellyn Hack and found stable for discharge.  Consults: cardiology  Significant Diagnostic Studies:  BMET    Component Value Date/Time   NA 138 11/10/2013 0430   K 3.4* 11/10/2013 0430   CL 101 11/10/2013 0430   CO2 20  11/10/2013 0430   GLUCOSE 71 11/10/2013 0430   BUN 8 11/10/2013 0430   CREATININE 0.50 11/10/2013 0430   CALCIUM 8.3* 11/10/2013 0430   GFRNONAA >90 11/10/2013 0430   GFRAA >90 11/10/2013 0430    CBC    Component Value Date/Time   WBC 5.2 11/06/2013 0305   RBC 3.98 11/06/2013 0305   HGB 13.1 11/06/2013 0305   HCT 38.1 11/06/2013 0305   PLT 144* 11/06/2013 0305   MCV 95.7 11/06/2013 0305   MCH 32.9 11/06/2013 0305   MCHC 34.4 11/06/2013 0305   RDW 13.8 11/06/2013 0305   LYMPHSABS 1.1 11/05/2013 1455   MONOABS 0.4 11/05/2013 1455   EOSABS 0.0 11/05/2013 1455   BASOSABS 0.0 11/05/2013 1455    Troponin negative X 3 <0.30 INR at discharge 4.17  U/A positve for KLEBSIELLA SPECIES treated  CT HEAD WITHOUT CONTRAST  TECHNIQUE:  Contiguous axial images were obtained from the base of the skull  through the vertex without intravenous contrast.  COMPARISON: 10/04/2012  FINDINGS:  Mild left parietal encephalomalacia is stable. Mild cortical volume  loss noted with proportional ventricular prominence. Areas of  periventricular white matter hypodensity are most compatible with  small vessel ischemic change. No acute hemorrhage, infarct, or mass  lesion is identified. No midline shift. Orbits and paranasal sinuses  are within normal limits.  IMPRESSION:  No acute intracranial finding. Chronic stable findings as above.    PORTABLE CHEST - 1 VIEW  COMPARISON: 10/04/2012  FINDINGS:  There is stable elevation of the left hemidiaphragm with left lower  lobe atelectasis. There is no evidence of pulmonary edema,  consolidation, pneumothorax, nodule or pleural fluid. The heart size  and mediastinal contours are stable.  IMPRESSION:  Stable elevation of left hemidiaphragm with left lower lobe  atelectasis.  EKG: Atrial fibrillation rate 86 Left anterior fascicular block LVH with secondary repolarization abnormality Anterior Q waves, possibly due to LVH Artifact in lead(s) I II aVR aVL V1  V2  Discharge Exam: Blood pressure 122/68, pulse 84, temperature 97.8 F (36.6 C), temperature source Oral, resp. rate 18, height 5\' 4"  (1.626 m), weight 115 lb 14.4 oz (52.572 kg), SpO2 94.00%.   Disposition: 01-Home or Self Care     Medication List    STOP taking these medications       benazepril 5 MG tablet  Commonly known as:  LOTENSIN     isosorbide mononitrate 30 MG 24 hr tablet  Commonly known as:  IMDUR      TAKE these medications       atorvastatin 10 MG tablet  Commonly known as:  LIPITOR  Take 1 tablet (10 mg total) by mouth at bedtime.     CALCIUM-VITAMIN D PO  Take 1 tablet by mouth daily.     cephALEXin 500 MG capsule  Commonly known as:  KEFLEX  Take 1 capsule (500 mg total) by mouth every 12 (twelve) hours. Completing course started in hospital  DSS 100 MG Caps  Take 100 mg by mouth 2 (two) times daily.     esomeprazole 20 MG capsule  Commonly known as:  NEXIUM  Take 20 mg by mouth daily before breakfast.     estazolam 2 MG tablet  Commonly known as:  PROSOM  Take 2 mg by mouth at bedtime.     metoprolol tartrate 25 MG tablet  Commonly known as:  LOPRESSOR  Take 12.5 mg by mouth daily.     MYRBETRIQ 25 MG Tb24 tablet  Generic drug:  mirabegron ER  Take 25 mg by mouth daily.     niacin 500 MG tablet  Take 500 mg by mouth daily.     NITROSTAT 0.4 MG SL tablet  Generic drug:  nitroGLYCERIN  Place 0.4 mg under the tongue every 5 (five) minutes as needed for chest pain.     oxybutynin 10 MG 24 hr tablet  Commonly known as:  DITROPAN-XL  Take 10 mg by mouth at bedtime.     polyethylene glycol packet  Commonly known as:  MIRALAX / GLYCOLAX  Take 17 g by mouth daily.     Potassium 99 MG Tabs  Take 1 tablet by mouth daily.     prazosin 2 MG capsule  Commonly known as:  MINIPRESS  Take 2 mg by mouth 2 (two) times daily.     traMADol-acetaminophen 37.5-325 MG per tablet  Commonly known as:  ULTRACET  Take 37.5 tablets by mouth  daily as needed for moderate pain or severe pain (*May take two tablets three times daily for hip pain).     trimethoprim 100 MG tablet  Commonly known as:  TRIMPEX  Take 100 mg by mouth daily.     valACYclovir 1000 MG tablet  Commonly known as:  VALTREX  Take 1 tablet (1,000 mg total) by mouth 3 (three) times daily.     warfarin 4 MG tablet  Commonly known as:  COUMADIN  Take 1 tablet (4 mg total) by mouth every evening. Holding for 2 days then recheck at PCP  Start taking on:  11/13/2013           Follow-up Information   Follow up with Cristopher Peru, MD On 01/22/2014. (at 1:15pm this doctor is for electrical part of the heart.)    Specialty:  Cardiology   Contact information:   [address] [address] [phone]       Follow up with Lorretta Harp, MD On 11/27/2013. (at 3:30 pm with Tarri Fuller, [facility])    Specialty:  Cardiology   Contact information:   [address] [place] [address] [phone]       Schedule an appointment as soon as possible for a visit with Glo Herring., MD. (see the coumadin clinic the 24th of Sept this week. )    Specialty:  Internal Medicine   Contact information:   [address] [place] [address] [phone]        Discharge Instructions: Hold your warfarin (coumadin) for 2 days today Tuesday and Wednesday.  Have your coumadin level checked with your PCP on Thursday. They will then direct how to take your coumadin.  If your heart rate becomes elevated at greater than 130 then take an extra dose of the lopressor 12.5 mg ( half of 25 mg tablet)  Wear abdominal binder during the day to help improve your BP.  Stop smoking.  Continue the keflex antibiotic through the 24th. Then  stop.  Resume trimethoprim on the 25th.  We have you on Valtrex for your shingles.  Last doses on the 24th.  If problems continue see your primary doctor.  Heart Healthy Diet.   Do not take the  benazepril or  Isosorbide.  We may have to add back but stop then for now       Signed: [facility]: [facility] 11/11/2013, 10:12 AM  Time spent on discharge :> 30 minutes.

## 2013-11-11 NOTE — Care Management Note (Signed)
    Page 1 of 1   11/11/2013     2:24:56 PM CARE MANAGEMENT NOTE 11/11/2013  Patient:  Andrea [name], Andrea [name]   Account Number:  [id number]  Date Initiated:  11/10/2013  Documentation initiated by:  Tomi Bamberger  Subjective/Objective Assessment:   dx syncope  admit- lives with spouse.     Action/Plan:   Anticipated DC Date:  11/11/2013   Anticipated DC Plan:  [place]  CM consult      [facility] Choice  HOME HEALTH   Choice offered to / List presented to:  C-1 Patient        [place] arranged  [place] [facility]      [place].   Status of service:  Completed, signed off Medicare Important Message given?  YES (If response is "NO", the following Medicare IM given date fields will be blank) Date Medicare IM given:  11/10/2013 Medicare IM given by:  Tomi Bamberger Date Additional Medicare IM given:   Additional Medicare IM given by:    Discharge Disposition:  [place]  Per UR Regulation:  Reviewed for med. necessity/level of care/duration of stay  If discussed at [place] of Stay Meetings, dates discussed:    Comments:  11/11/2013 1200 NCM spoke to pt and gave permisison to speak to husband. Offered choice for [facility] and requested [facility]. Pt has wheelchair and RW at home. Notified [facility] for [facility] for scheduled dc home today. Jonnie Finner RN CCM Case Mgmt phone [phone]

## 2013-11-11 NOTE — Discharge Instructions (Signed)
Hold your warfarin (coumadin) for 2 days today Tuesday and Wednesday.  Have your coumadin level checked with your PCP on Thursday. They will then direct how to take your coumadin.  If your heart rate becomes elevated at greater than 130 then take an extra dose of the lopressor 12.5 mg ( half of 25 mg tablet)  Wear abdominal binder during the day to help improve your BP.  Stop smoking.  Continue the keflex antibiotic through the 24th. Then stop.  Resume trimethoprim on the 25th.  We have you on Valtrex for your shingles.  Last doses on the 24th.  If problems continue see your primary doctor.  Heart Healthy Diet.   *Do not take the benazepril or  Isosorbide.  We may have to add back but stop then for now *

## 2013-11-11 NOTE — Progress Notes (Signed)
Pt and family given discharge instructions and PIV removed.  Abdominal binder placed.  Pt taken to discharge location via wheelchair.

## 2013-11-11 NOTE — Progress Notes (Signed)
CARE MANAGEMENT NOTE 11/11/2013  Patient:  OVELLA, MANYGOATS   Account Number:  [id number]  Date Initiated:  11/10/2013  Documentation initiated by:  Tomi Bamberger  Subjective/Objective Assessment:   dx syncope  admit- lives with spouse.     Action/Plan:   Anticipated DC Date:  11/11/2013   Anticipated DC Plan:  [place]  CM consult      [facility] Choice  HOME HEALTH   Choice offered to / List presented to:  C-1 Patient        [place] arranged  [place] [facility]      [place].   Status of service:  Completed, signed off Medicare Important Message given?  YES (If response is "NO", the following Medicare IM given date fields will be blank) Date Medicare IM given:  11/10/2013 Medicare IM given by:  Tomi Bamberger Date Additional Medicare IM given:   Additional Medicare IM given by:    Discharge Disposition:  [place]  Per UR Regulation:  Reviewed for med. necessity/level of care/duration of stay  If discussed at [place] of Stay Meetings, dates discussed:    Comments:  11/11/2013 1200 NCM spoke to pt and gave permisison to speak to husband. Offered choice for [facility] and requested [facility]. Pt has wheelchair and RW at home. Notified [facility] for [facility] for scheduled dc home today. Jonnie Finner RN CCM Case Mgmt phone [phone]

## 2013-11-11 NOTE — Progress Notes (Signed)
ANTICOAGULATION CONSULT NOTE - Follow Up Consult  Pharmacy Consult for coumadin Indication: atrial fibrillation  Allergies  Allergen Reactions  . Ciprofloxacin Anaphylaxis and Rash  . Codeine   . Contrast Media [Iodinated Diagnostic Agents]     Per pt chart.  . Iodine   . Lovenox [Enoxaparin]   . Nitrofurantoin Other (See Comments)    Numbness in arms and legs  . Nitrofurantoin Monohyd Macro Other (See Comments)  . Penicillins Rash  . Sulfa Antibiotics Rash  . Sulfonamide Derivatives Rash    Patient Measurements: Height: 5\' 4"  (162.6 cm) (46ft4in) Weight: 115 lb 14.4 oz (52.572 kg) IBW/kg (Calculated) : 54.7   Vital Signs: Temp: 97.8 F (36.6 C) (09/22 0541) Temp src: Oral (09/22 0541) BP: 122/68 mmHg (09/22 0541) Pulse Rate: 84 (09/22 0541)  Labs:  Recent Labs  11/09/13 0705  11/10/13 0430 11/10/13 1030 11/10/13 1800 11/11/13 0005  LABPROT 25.1*  --  29.8*  --   --  40.3*  INR 2.28*  --  2.84*  --   --  4.17*  CREATININE  --   --  0.50  --   --   --   TROPONINI  --   < > <0.30 <0.30 <0.30 <0.30  < > = values in this interval not displayed.  Estimated Creatinine Clearance: 49.7 ml/min (by C-G formula based on Cr of 0.5).   Assessment: Patient is a 78 y.o F on coumadin for afib.  Cards decided not to pursue loop recorder placement for patient.  Plan to d/c her home today.  INR increased sharply from 2.84 to 4.17 after 2 mg coumadin dose given last night.  Patient is probably sensitive to coumadin d/t poor oral intake and on antibiotic. No bleeding documented.   Goal of Therapy:  INR 2-3    Plan:  1) hold coumadin for now 2) f/u with patient in the morning if still here  Tylan Kinn P 11/11/2013,10:54 AM

## 2013-11-11 NOTE — Progress Notes (Deleted)
On call physician paged for pt's low blood pressure. Pt in no s/s of distress. Pt asleep.

## 2013-11-21 ENCOUNTER — Encounter: Payer: Self-pay | Admitting: Cardiovascular Disease

## 2013-11-21 ENCOUNTER — Ambulatory Visit (INDEPENDENT_AMBULATORY_CARE_PROVIDER_SITE_OTHER): Payer: Medicare Other | Admitting: Cardiovascular Disease

## 2013-11-21 VITALS — BP 131/61 | HR 64 | Ht 63.0 in | Wt 112.2 lb

## 2013-11-21 DIAGNOSIS — I495 Sick sinus syndrome: Secondary | ICD-10-CM

## 2013-11-21 DIAGNOSIS — I482 Chronic atrial fibrillation, unspecified: Secondary | ICD-10-CM

## 2013-11-21 DIAGNOSIS — I951 Orthostatic hypotension: Secondary | ICD-10-CM

## 2013-11-21 DIAGNOSIS — E785 Hyperlipidemia, unspecified: Secondary | ICD-10-CM

## 2013-11-21 DIAGNOSIS — I10 Essential (primary) hypertension: Secondary | ICD-10-CM

## 2013-11-21 NOTE — Assessment & Plan Note (Addendum)
Thought to be orthostatic in nature. Event  monitorings showed tachycardia bradycardia syndrome but without pauses long enough to explain syncope. A. Abdominal binder was recommended by Dr. Jolyn Nap however the patient is unable to wear this. She is wheelchair bound and nonambulatory and therefore a loop reporter implant was not thought to be a reasonable option.

## 2013-11-21 NOTE — Assessment & Plan Note (Signed)
An abdominal binder was recommended by Dr. Caryl Comes however the patient is unable to wear this.she is primarily wheelchair bound and is not ambulatory

## 2013-11-21 NOTE — Assessment & Plan Note (Signed)
On statin therapy with lipid profile followed by her PCP

## 2013-11-21 NOTE — Assessment & Plan Note (Signed)
Controlled on current medications 

## 2013-11-21 NOTE — Assessment & Plan Note (Addendum)
Found on event monitoring with heart rates up to 130  and pauses up to 2-1/2 seconds probably not related to her syncopal episodes.

## 2013-11-21 NOTE — Patient Instructions (Signed)
We request that you follow-up in: 6 months with Laura Ingold NP and in 1 year with Dr Berry  You will receive a reminder letter in the mail two months in advance. If you don't receive a letter, please call our office to schedule the follow-up appointment.   

## 2013-11-21 NOTE — Progress Notes (Signed)
11/21/2013 Andrea [name]   07-04-1935  [id number]  Primary Physician Collene Mares, PA-C Primary Cardiologist: Lorretta Harp MD Renae Gloss   HPI:  The patient is a 78year-old, thin and frail appearing, married, Caucasian female, mother of four, who I last saw in the office 11/04/13. She has a history of continued tobacco abuse of one and a half packs per day, recalcitrant to risk factor modification. She has chronic A fib, rate controlled on Coumadin anticoagulation, followed by [facility]. She has had a failed TEE cardioversion in the past. Other problems include: Hypertension and hyperlipidemia. She did have a stroke in June 2012 and another stroke apparently in March 2013 in the left MCA distribution leaving her with dysarthria and right-sided paresis.   An echo done during her hospitalization revealed an EF of 45 to 50% with severe left atrial dilatation.  She was seen in the office by Cecilie Kicks registered nurse practitioner 06/11/12 for chest pain pain. She was begun on oral nitrate which improved the frequency of her chest pain. A stress test was suggested but the patient declined when she was seen last. She did have a Persantine Myoview performed in 2008 showed mild apical defect.  She saw [facility] in the office recently because of several episodes of syncope. Carotid Dopplers showed no significant ICA stenosis and a one-month event monitor showed tachybradycardia syndrome with pauses up to 2.6 seconds pauses as well as HR up to 130.  She was admitted to  [facility] and transferred to [facility] because of syncope. She was seen by Dr. Crissie Sickles and Jolyn Nap from the EP service. Initially a loop recorder was suggested however because the patient is non-ambulatory with did not appear to be a viable option. There did seem to be an orthostatic component and an abdominal binder was recommended.    Current Outpatient Prescriptions    Medication Sig Dispense Refill  . atorvastatin (LIPITOR) 10 MG tablet Take 1 tablet (10 mg total) by mouth at bedtime.  30 tablet  11  . CALCIUM-VITAMIN D PO Take 1 tablet by mouth daily.      Marland Kitchen docusate sodium 100 MG CAPS Take 100 mg by mouth 2 (two) times daily.  10 capsule  0  . esomeprazole (NEXIUM) 20 MG capsule Take 20 mg by mouth daily before breakfast.      . estazolam (PROSOM) 2 MG tablet Take 2 mg by mouth at bedtime.      . metoprolol tartrate (LOPRESSOR) 25 MG tablet Take 12.5 mg by mouth daily.      Marland Kitchen MYRBETRIQ 25 MG TB24 Take 25 mg by mouth daily.      . niacin 500 MG tablet Take 500 mg by mouth daily.      Marland Kitchen NITROSTAT 0.4 MG SL tablet Place 0.4 mg under the tongue every 5 (five) minutes as needed for chest pain.       Marland Kitchen oxybutynin (DITROPAN-XL) 10 MG 24 hr tablet Take 10 mg by mouth at bedtime.      . polyethylene glycol (MIRALAX / GLYCOLAX) packet Take 17 g by mouth daily.  14 each  0  . Potassium 99 MG TABS Take 1 tablet by mouth daily.      . prazosin (MINIPRESS) 2 MG capsule Take 2 mg by mouth 2 (two) times daily.       . traMADol-acetaminophen (ULTRACET) 37.5-325 MG per tablet Take 37.5 tablets by mouth daily as needed for moderate  pain or severe pain (*May take two tablets three times daily for hip pain).       Marland Kitchen trimethoprim (TRIMPEX) 100 MG tablet Take 100 mg by mouth daily.       Marland Kitchen warfarin (COUMADIN) 4 MG tablet Take 1 tablet (4 mg total) by mouth every evening. Holding for 2 days then recheck at PCP  30 tablet  1   No current facility-administered medications for this visit.    Allergies  Allergen Reactions  . Ciprofloxacin Anaphylaxis and Rash  . Codeine   . Contrast Media [Iodinated Diagnostic Agents]     Per pt chart.  . Iodine   . Lovenox [Enoxaparin]   . Nitrofurantoin Other (See Comments)    Numbness in arms and legs  . Nitrofurantoin Monohyd Macro Other (See Comments)  . Penicillins Rash  . Sulfa Antibiotics Rash  . Sulfonamide Derivatives Rash     History   Social History  . Marital Status: Married    Spouse Name: N/A    Number of Children: N/A  . Years of Education: N/A   Occupational History  . Not on file.   Social History Main Topics  . Smoking status: Current Every Day Smoker -- 0.50 packs/day for 40 years    Types: Cigarettes  . Smokeless tobacco: Never Used  . Alcohol Use: No  . Drug Use: No  . Sexual Activity: Not Currently   Other Topics Concern  . Not on file   Social History Narrative  . No narrative on file     Review of Systems: General: negative for chills, fever, night sweats or weight changes.  Cardiovascular: negative for chest pain, dyspnea on exertion, edema, orthopnea, palpitations, paroxysmal nocturnal dyspnea or shortness of breath Dermatological: negative for rash Respiratory: negative for cough or wheezing Urologic: negative for hematuria Abdominal: negative for nausea, vomiting, diarrhea, bright red blood per rectum, melena, or hematemesis Neurologic: negative for visual changes, syncope, or dizziness All other systems reviewed and are otherwise negative except as noted above.    Blood pressure 131/61, pulse 64, height 5\' 3"  (1.6 m), weight 112 lb 3.2 oz (50.894 kg).  General appearance: alert and no distress Neck: no adenopathy, no carotid bruit, no JVD, supple, symmetrical, trachea midline and thyroid not enlarged, symmetric, no tenderness/mass/nodules Lungs: clear to auscultation bilaterally Heart: irregularly irregular rhythm Extremities: 2-3+ pitting edema bilaterally  EKG not performed today  ASSESSMENT AND PLAN:   Atrial fibrillation, permanent  Chronic A. Fib, rate controlled on Coumadin anticoagulation  Essential hypertension Controlled on current medications  Hyperlipidemia On statin therapy with lipid profile followed by her PCP  Tachy-brady syndrome Found on event monitoring with heart rates up to 130  and pauses up to 2-1/2 seconds probably not related to  her syncopal episodes.  Orthostatic hypotension An abdominal binder was recommended by Dr. Caryl Comes however the patient is unable to wear this.she is primarily wheelchair bound and is not ambulatory  Syncope Thought to be orthostatic in nature. Event  monitorings showed tachycardia bradycardia syndrome but without pauses long enough to explain syncope. A. Abdominal binder was recommended by Dr. Jolyn Nap however the patient is unable to wear this. She is wheelchair bound and nonambulatory and therefore a loop reporter implant was not thought to be a reasonable option.      Lorretta Harp MD FACP,FACC,FAHA, [facility] 11/21/2013 10:56 AM

## 2013-11-21 NOTE — Assessment & Plan Note (Signed)
Chronic A. Fib, rate controlled on Coumadin anticoagulation

## 2014-01-22 ENCOUNTER — Encounter: Payer: Medicare Other | Admitting: Internal Medicine

## 2014-04-24 ENCOUNTER — Encounter: Payer: Medicare Other | Admitting: Internal Medicine

## 2014-05-13 ENCOUNTER — Ambulatory Visit (INDEPENDENT_AMBULATORY_CARE_PROVIDER_SITE_OTHER): Payer: Medicare Other | Admitting: Cardiovascular Disease

## 2014-05-13 ENCOUNTER — Encounter: Payer: Self-pay | Admitting: Cardiovascular Disease

## 2014-05-13 VITALS — BP 124/80 | HR 60 | Ht 62.0 in | Wt 104.0 lb

## 2014-05-13 DIAGNOSIS — E785 Hyperlipidemia, unspecified: Secondary | ICD-10-CM | POA: Diagnosis not present

## 2014-05-13 DIAGNOSIS — I10 Essential (primary) hypertension: Secondary | ICD-10-CM

## 2014-05-13 DIAGNOSIS — Z720 Tobacco use: Secondary | ICD-10-CM | POA: Diagnosis not present

## 2014-05-13 DIAGNOSIS — I482 Chronic atrial fibrillation, unspecified: Secondary | ICD-10-CM

## 2014-05-13 MED ORDER — NITROSTAT 0.4 MG SL SUBL: 0.4000 mg | SUBLINGUAL_TABLET | SUBLINGUAL | Status: AC | PRN

## 2014-05-13 NOTE — Assessment & Plan Note (Signed)
History of hyperlipidemia atorvastatin 10 mg a day followed by her PCP

## 2014-05-13 NOTE — Assessment & Plan Note (Signed)
History of hypertension blood pressure measured at 124/80. She is on Lopressor. Continue current meds at current dosing

## 2014-05-13 NOTE — Assessment & Plan Note (Signed)
Continued tobacco abuse one pack per day recalcitrant risk factor modification

## 2014-05-13 NOTE — Progress Notes (Signed)
05/13/2014 Andrea [name]   October 22, 1935  [id number]  Primary Physician Collene Mares, PA-C Primary Cardiologist: Lorretta Harp MD Renae Gloss   HPI:  The patient is a 79year-old, thin and frail appearing, married, Caucasian female, mother of four, who I last saw in the office 11/21/13.  She has a history of continued tobacco abuse of one and a half packs per day, recalcitrant to risk factor modification. She has chronic A fib, rate controlled on Coumadin anticoagulation, followed by [facility]. She has had a failed TEE cardioversion in the past. Other problems include: Hypertension and hyperlipidemia. She did have a stroke in June 2012 and another stroke apparently in March 2013 in the left MCA distribution leaving her with dysarthria and right-sided paresis.   An echo done during her hospitalization revealed an EF of 45 to 50% with severe left atrial dilatation.  She was seen in the office by Cecilie Kicks registered nurse practitioner 06/11/12 for chest pain pain. She was begun on oral nitrate which improved the frequency of her chest pain. A stress test was suggested but the patient declined when she was seen last. She did have a Persantine Myoview performed in 2008 showed mild apical defect.  She saw [facility] in the office recently because of several episodes of syncope. Carotid Dopplers showed no significant ICA stenosis and a one-month event monitor showed tachybradycardia syndrome with pauses up to 2.6 seconds pauses as well as HR up to 130.  She was admitted to [facility] and transferred to [facility] because of syncope. She was seen by Dr. Crissie Sickles and Jolyn Nap from the EP service. Initially a loop recorder was suggested however because the patient is non-ambulatory with did not appear to be a viable option. There did seem to be an orthostatic component and an abdominal binder was recommended.  Since I saw her in the office back in  October she remained clinically stable physically denying chest pain or shortness of breath. She is wheelchair-bound secondary to a remote stroke with right-sided paresis.   Current Outpatient Prescriptions  Medication Sig Dispense Refill  . atorvastatin (LIPITOR) 10 MG tablet Take 1 tablet (10 mg total) by mouth at bedtime. 30 tablet 11  . CALCIUM-VITAMIN D PO Take 1 tablet by mouth daily.    . clotrimazole-betamethasone (LOTRISONE) cream as needed.   0  . docusate sodium 100 MG CAPS Take 100 mg by mouth 2 (two) times daily. (Patient taking differently: Take 100 mg by mouth as needed. ) 10 capsule 0  . esomeprazole (NEXIUM) 20 MG capsule Take 20 mg by mouth daily before breakfast.    . estazolam (PROSOM) 2 MG tablet Take 2 mg by mouth at bedtime.    . metoprolol tartrate (LOPRESSOR) 25 MG tablet Take 12.5 mg by mouth daily.    Marland Kitchen MYRBETRIQ 25 MG TB24 Take 25 mg by mouth daily.    . niacin 500 MG tablet Take 500 mg by mouth daily.    Marland Kitchen NITROSTAT 0.4 MG SL tablet Place 0.4 mg under the tongue every 5 (five) minutes as needed for chest pain.     Marland Kitchen oxybutynin (DITROPAN-XL) 10 MG 24 hr tablet Take 10 mg by mouth at bedtime.    . polyethylene glycol (MIRALAX / GLYCOLAX) packet Take 17 g by mouth daily. (Patient taking differently: Take 17 g by mouth daily as needed. ) 14 each 0  . Potassium 99 MG TABS Take 1 tablet by mouth daily.    Marland Kitchen  prazosin (MINIPRESS) 2 MG capsule Take 2 mg by mouth 2 (two) times daily.     . traMADol-acetaminophen (ULTRACET) 37.5-325 MG per tablet Take 37.5 tablets by mouth daily as needed for moderate pain or severe pain (*May take two tablets three times daily for hip pain).     . triamcinolone cream (KENALOG) 0.1 %   0  . trimethoprim (TRIMPEX) 100 MG tablet Take 100 mg by mouth daily.     Marland Kitchen warfarin (COUMADIN) 3 MG tablet Dr Gerarda Fraction follows; alternates 3mg  with 4mg   0  . warfarin (COUMADIN) 4 MG tablet Take 1 tablet (4 mg total) by mouth every evening. Holding for 2 days  then recheck at PCP 30 tablet 1   No current facility-administered medications for this visit.    Allergies  Allergen Reactions  . Ciprofloxacin Anaphylaxis and Rash  . Codeine   . Contrast Media [Iodinated Diagnostic Agents]     Per pt chart.  . Iodine   . Lovenox [Enoxaparin]   . Nitrofurantoin Other (See Comments)    Numbness in arms and legs  . Nitrofurantoin Monohyd Macro Other (See Comments)  . Penicillins Rash  . Sulfa Antibiotics Rash  . Sulfonamide Derivatives Rash    History   Social History  . Marital Status: Married    Spouse Name: N/A  . Number of Children: N/A  . Years of Education: N/A   Occupational History  . Not on file.   Social History Main Topics  . Smoking status: Current Every Day Smoker -- 0.50 packs/day for 40 years    Types: Cigarettes  . Smokeless tobacco: Never Used  . Alcohol Use: No  . Drug Use: No  . Sexual Activity: Not Currently   Other Topics Concern  . Not on file   Social History Narrative     Review of Systems: General: negative for chills, fever, night sweats or weight changes.  Cardiovascular: negative for chest pain, dyspnea on exertion, edema, orthopnea, palpitations, paroxysmal nocturnal dyspnea or shortness of breath Dermatological: negative for rash Respiratory: negative for cough or wheezing Urologic: negative for hematuria Abdominal: negative for nausea, vomiting, diarrhea, bright red blood per rectum, melena, or hematemesis Neurologic: negative for visual changes, syncope, or dizziness All other systems reviewed and are otherwise negative except as noted above.    Blood pressure 124/80, pulse 60, height 5\' 2"  (1.575 m), weight 104 lb (47.174 kg).  General appearance: alert and no distress Neck: no adenopathy, no carotid bruit, no JVD, supple, symmetrical, trachea midline and thyroid not enlarged, symmetric, no tenderness/mass/nodules Lungs: clear to auscultation bilaterally Heart: irregularly irregular  rhythm Extremities: extremities normal, atraumatic, no cyanosis or edema  EKG atrial fibrillation with a ventricular response of 60, septal Q waves and left anterior fascicular block. I personally reviewed this EKG  ASSESSMENT AND PLAN:   Tobacco abuse Continued tobacco abuse one pack per day recalcitrant risk factor modification   Hyperlipidemia History of hyperlipidemia atorvastatin 10 mg a day followed by her PCP   Essential hypertension History of hypertension blood pressure measured at 124/80. She is on Lopressor. Continue current meds at current dosing   Atrial fibrillation, permanent  History of chronic A. Fib rate controlled on Coumadin anticoagulation was felt attempt at TEE guided cardioversion in the past       Lorretta Harp MD [facility], [facility] 05/13/2014 1:23 PM

## 2014-05-13 NOTE — Assessment & Plan Note (Signed)
History of chronic A. Fib rate controlled on Coumadin anticoagulation was felt attempt at TEE guided cardioversion in the past

## 2014-05-13 NOTE — Patient Instructions (Signed)
Your physician wants you to follow-up in: 1 year with Dr Berry. You will receive a reminder letter in the mail two months in advance. If you don't receive a letter, please call our office to schedule the follow-up appointment.  

## 2014-05-21 ENCOUNTER — Other Ambulatory Visit: Payer: Self-pay | Admitting: Cardiovascular Disease

## 2014-06-05 ENCOUNTER — Other Ambulatory Visit (HOSPITAL_COMMUNITY): Payer: Self-pay | Admitting: Physician Assistant

## 2014-06-05 DIAGNOSIS — Z1231 Encounter for screening mammogram for malignant neoplasm of breast: Secondary | ICD-10-CM

## 2014-07-13 ENCOUNTER — Ambulatory Visit (HOSPITAL_COMMUNITY)
Admission: RE | Admit: 2014-07-13 | Discharge: 2014-07-13 | Disposition: A | Discharge status: Home / Self Care | Payer: Medicare Other | Source: Ambulatory Visit | Attending: Physician Assistant | Admitting: Physician Assistant

## 2014-07-13 DIAGNOSIS — Z1231 Encounter for screening mammogram for malignant neoplasm of breast: Secondary | ICD-10-CM | POA: Diagnosis not present

## 2014-07-16 ENCOUNTER — Encounter (HOSPITAL_COMMUNITY): Payer: Self-pay | Admitting: *Deleted

## 2014-07-16 ENCOUNTER — Emergency Department (HOSPITAL_COMMUNITY)
Admission: EM | Admit: 2014-07-16 | Discharge: 2014-07-17 | Disposition: A | Discharge status: Home / Self Care | Payer: Medicare Other | Attending: Emergency Medicine | Admitting: Emergency Medicine

## 2014-07-16 DIAGNOSIS — Y998 Other external cause status: Secondary | ICD-10-CM | POA: Diagnosis not present

## 2014-07-16 DIAGNOSIS — S81801A Unspecified open wound, right lower leg, initial encounter: Secondary | ICD-10-CM | POA: Diagnosis not present

## 2014-07-16 DIAGNOSIS — Y9389 Activity, other specified: Secondary | ICD-10-CM | POA: Diagnosis not present

## 2014-07-16 DIAGNOSIS — E785 Hyperlipidemia, unspecified: Secondary | ICD-10-CM | POA: Insufficient documentation

## 2014-07-16 DIAGNOSIS — I2510 Atherosclerotic heart disease of native coronary artery without angina pectoris: Secondary | ICD-10-CM | POA: Insufficient documentation

## 2014-07-16 DIAGNOSIS — Z8673 Personal history of transient ischemic attack (TIA), and cerebral infarction without residual deficits: Secondary | ICD-10-CM | POA: Insufficient documentation

## 2014-07-16 DIAGNOSIS — K219 Gastro-esophageal reflux disease without esophagitis: Secondary | ICD-10-CM | POA: Insufficient documentation

## 2014-07-16 DIAGNOSIS — S81811A Laceration without foreign body, right lower leg, initial encounter: Secondary | ICD-10-CM

## 2014-07-16 DIAGNOSIS — Z720 Tobacco use: Secondary | ICD-10-CM | POA: Insufficient documentation

## 2014-07-16 DIAGNOSIS — I10 Essential (primary) hypertension: Secondary | ICD-10-CM | POA: Diagnosis not present

## 2014-07-16 DIAGNOSIS — Y9289 Other specified places as the place of occurrence of the external cause: Secondary | ICD-10-CM | POA: Diagnosis not present

## 2014-07-16 DIAGNOSIS — Z23 Encounter for immunization: Secondary | ICD-10-CM | POA: Insufficient documentation

## 2014-07-16 DIAGNOSIS — Z7901 Long term (current) use of anticoagulants: Secondary | ICD-10-CM | POA: Diagnosis not present

## 2014-07-16 DIAGNOSIS — Z79899 Other long term (current) drug therapy: Secondary | ICD-10-CM | POA: Insufficient documentation

## 2014-07-16 DIAGNOSIS — W050XXA Fall from non-moving wheelchair, initial encounter: Secondary | ICD-10-CM | POA: Insufficient documentation

## 2014-07-16 DIAGNOSIS — M79661 Pain in right lower leg: Secondary | ICD-10-CM | POA: Diagnosis present

## 2014-07-16 DIAGNOSIS — Z880 Allergy status to penicillin: Secondary | ICD-10-CM | POA: Insufficient documentation

## 2014-07-16 DIAGNOSIS — Z85828 Personal history of other malignant neoplasm of skin: Secondary | ICD-10-CM | POA: Diagnosis not present

## 2014-07-16 MED ORDER — TETANUS-DIPHTH-ACELL PERTUSSIS 5-2.5-18.5 LF-MCG/0.5 IM SUSP
0.5000 mL | Freq: Once | INTRAMUSCULAR | Status: AC
  Administered 2014-07-16: 0.5 mL via INTRAMUSCULAR
  Filled 2014-07-16: qty 0.5

## 2014-07-16 MED ORDER — BACITRACIN-NEOMYCIN-POLYMYXIN 400-5-5000 EX OINT
TOPICAL_OINTMENT | Freq: Once | CUTANEOUS | Status: AC
  Administered 2014-07-16: 2 via TOPICAL
  Filled 2014-07-16: qty 2

## 2014-07-16 NOTE — ED Provider Notes (Signed)
CSN: [id number]     Arrival date & time 07/16/14  2142 History  This chart was scribed for Orpah Greek, MD by Irene Pap, ED Scribe. This patient was seen in room APA09/APA09 and patient care was started at 11:25 PM.    Chief Complaint  Patient presents with  . Fall   The history is provided by the patient. No language interpreter was used.  HPI Comments: Andrea [name] is a 79 y.o. female who presents to the Emergency Department complaining of a fall onset 3 hours ago. Her husband states that she tried to get up from her wheelchair and fell back, receiving a laceration to her lower right leg. Her husband denies knowing when her last tdap was. She denies hitting her head, LOC, or any other current pain. Husband states that the patient is on Coumadin.   Past Medical History  Diagnosis Date  . Hypertension   . Stroke   . Acid reflux   . Atrial fibrillation     tachybradycardia syndrome  . Tobacco abuse 04/30/2011  . Cardiomyopathy 05/02/2011    EF 45-50%, per Echo.  . 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 Carotid artery stenosis 05/02/2011  . CAD (coronary artery disease) 07/20/2006    R/S MV - mild perfusion defect in apical region consistent w/ infarct/scar vs apical thinning; no scintigraphic evidence of inducible myocardial ischemia; baseline EKG demonstrates A Flutter w/ variable AV conduction  . Hyperlipidemia   . Chest pain   . Cerebral atherosclerosis 12/11/2011    Carotid duplex - R ICA 0-49% diameter reduction, velocities suggest high end; L ICA 0-49% reduction, velocities suggest mid range  . Neuropathy   . Syncope   . Chronic anticoagulation 11/11/2013  . Orthostatic hypotension 11/11/2013  . Cancer     facial   Past Surgical History  Procedure Laterality Date  . Cholecystectomy    . Skin cancer excision    . Tonsillectomy    . Tee with cardioversion      failed in past but dates not given   Family History  Problem Relation Age of Onset  .  Adopted: Yes   History  Substance Use Topics  . Smoking status: Current Every Day Smoker -- 0.50 packs/day for 40 years    Types: Cigarettes  . Smokeless tobacco: Never Used  . Alcohol Use: No   OB History    No data available     Review of Systems  Cardiovascular: Negative for chest pain.  Gastrointestinal: Negative for abdominal pain.  Musculoskeletal: Positive for arthralgias. Negative for back pain.  Skin: Positive for wound.  Neurological: Negative for syncope.  All other systems reviewed and are negative.  Allergies  Ciprofloxacin; Codeine; Contrast media; Iodine; Lovenox; Nitrofurantoin; Nitrofurantoin monohyd macro; Penicillins; Sulfa antibiotics; and Sulfonamide derivatives  Home Medications   Prior to Admission medications   Medication Sig Start Date End Date Taking? Authorizing Provider  atorvastatin (LIPITOR) 10 MG tablet take 1 tablet by mouth at bedtime 05/21/14   Lorretta Harp, MD  CALCIUM-VITAMIN D PO Take 1 tablet by mouth daily.    Historical Provider, MD  clotrimazole-betamethasone (LOTRISONE) cream as needed.  02/10/14   Historical Provider, MD  docusate sodium 100 MG CAPS Take 100 mg by mouth 2 (two) times daily. Patient taking differently: Take 100 mg by mouth as needed.  11/11/13   Isaiah Serge, NP  esomeprazole (NEXIUM) 20 MG capsule Take 20 mg by mouth daily before breakfast.  Historical Provider, MD  estazolam (PROSOM) 2 MG tablet Take 2 mg by mouth at bedtime.    Historical Provider, MD  metoprolol tartrate (LOPRESSOR) 25 MG tablet Take 12.5 mg by mouth daily.    Historical Provider, MD  MYRBETRIQ 25 MG TB24 Take 25 mg by mouth daily. 06/27/12   Historical Provider, MD  niacin 500 MG tablet Take 500 mg by mouth daily.    Historical Provider, MD  NITROSTAT 0.4 MG SL tablet Place 1 tablet (0.4 mg total) under the tongue every 5 (five) minutes as needed for chest pain. 05/13/14   Lorretta Harp, MD  oxybutynin (DITROPAN-XL) 10 MG 24 hr tablet Take 10  mg by mouth at bedtime.    Historical Provider, MD  polyethylene glycol (MIRALAX / GLYCOLAX) packet Take 17 g by mouth daily. Patient taking differently: Take 17 g by mouth daily as needed.  11/11/13   Isaiah Serge, NP  Potassium 99 MG TABS Take 1 tablet by mouth daily.    Historical Provider, MD  prazosin (MINIPRESS) 2 MG capsule Take 2 mg by mouth 2 (two) times daily.     Historical Provider, MD  traMADol-acetaminophen (ULTRACET) 37.5-325 MG per tablet Take 37.5 tablets by mouth daily as needed for moderate pain or severe pain (*May take two tablets three times daily for hip pain).  11/03/13   Historical Provider, MD  triamcinolone cream (KENALOG) 0.1 %  02/26/14   Historical Provider, MD  trimethoprim (TRIMPEX) 100 MG tablet Take 100 mg by mouth daily.  01/07/13   Historical Provider, MD  warfarin (COUMADIN) 3 MG tablet Dr Gerarda Fraction follows; alternates 3mg  with 4mg  02/23/14   Historical Provider, MD  warfarin (COUMADIN) 4 MG tablet Take 1 tablet (4 mg total) by mouth every evening. Holding for 2 days then recheck at PCP 11/13/13   Isaiah Serge, NP   BP 139/73 mmHg  Pulse 66  Temp(Src) 98.8 F (37.1 C) (Oral)  Resp 18  SpO2 96%  Physical Exam  Constitutional: She is oriented to person, place, and time. She appears well-developed and well-nourished. No distress.  HENT:  Head: Normocephalic and atraumatic.  Right Ear: Hearing normal.  Left Ear: Hearing normal.  Nose: Nose normal.  Mouth/Throat: Oropharynx is clear and moist and mucous membranes are normal.  Eyes: Conjunctivae and EOM are normal. Pupils are equal, round, and reactive to light.  Neck: Normal range of motion. Neck supple.  Cardiovascular: Regular rhythm, S1 normal and S2 normal.  Exam reveals no gallop and no friction rub.   No murmur heard. Pulmonary/Chest: Effort normal and breath sounds normal. No respiratory distress. She exhibits no tenderness.  Abdominal: Soft. Normal appearance and bowel sounds are normal. There is no  hepatosplenomegaly. There is no tenderness. There is no rebound, no guarding, no tenderness at McBurney's point and negative Murphy's sign. No hernia.  Musculoskeletal: Normal range of motion.  Neurological: She is alert and oriented to person, place, and time. She has normal strength. No cranial nerve deficit or sensory deficit. Coordination normal. GCS eye subscore is 4. GCS verbal subscore is 5. GCS motor subscore is 6.  Skin: Skin is warm, dry and intact. No rash noted. No cyanosis.  Large, irregularly shaped, partial thickness skin tear to right lower leg; 1 cm small linear skin tear to left lower leg  Psychiatric: She has a normal mood and affect. Her speech is normal and behavior is normal. Thought content normal.  Nursing note and vitals reviewed.   ED Course  Procedures (including critical care time) DIAGNOSTIC STUDIES: Oxygen Saturation is 96% on room air, adequate by my interpretation.    COORDINATION OF CARE: 11:28 PM-Discussed treatment plan which includes wound dressing and treatment, update tdap vaccination with pt at bedside and pt agreed to plan.   Labs Review Labs Reviewed - No data to display  Imaging Review No results found.   EKG Interpretation None      MDM   Final diagnoses:  Skin tear of lower leg without complication, right, initial encounter    Patient presents to the ER for evaluation of skin tears on her lower legs. Patient was transferring from wheelchair when she fell forward and scraped her lower legs on the foot rests on the wheelchair. She did not fall to the ground. There was no head injury. Patient denies headache, neck pain, back pain, hip pain. She is complaining of only very slight pain in her lower extremities where the skin tears are. Husband did clean and dressed the area. They're concerned because she is on Coumadin. Wounds are not actively bleeding. There is no indication of head injury, no impact to the head, does not require neuroimaging.  Patient administered tetanus booster. Wounds were cleaned and dressed. Patient will follow up in primary care doctor's office for wound check. Return if signs of infection occur.  I personally performed the services described in this documentation, which was scribed in my presence. The recorded information has been reviewed and is accurate.     Orpah Greek, MD 07/16/14 [phone]

## 2014-07-16 NOTE — ED Notes (Signed)
Dr. Betsey Holiday in with pt at this time

## 2014-07-16 NOTE — ED Notes (Addendum)
Fell 830p. No HI or LOC, injury to rt lower leg, family says skin tear.  Bandage in place, Pt is on coumadin

## 2014-07-16 NOTE — Discharge Instructions (Signed)
Skin Tear Care A skin tear is when the top layer of skin peels off. To repair the skin, your doctor may use:   Tape.  Skin adhesive strips. HOME CARE  Change bandages (dressings) once a day or as told by your doctor.  Gently clean the area with salt (saline) solution or with a mild soap and water.  Do not rub the injured skin dry. Let the area air dry.  Put petroleum jelly or antibiotic cream on the tear. Do not allow a scab to form.  If the bandage sticks, moisten it with warm soapy water and remove it.  Protect the injured skin until it has healed.  Only take medicine as told by your doctor.  Take showers or baths using warm soapy water. Apply a new bandage after the shower or bath.  Keep all doctor visits as told. GET HELP RIGHT AWAY IF:   You have redness, puffiness (swelling), or more pain in the tear.  You have ayellowish-white fluid (pus) coming from the tear.  You have chills.  You have a red streak that goes away from the tear.  You have a bad smell coming from the tear or bandage.  You have a fever or lasting symptoms for more than 2-3 days.  You have a fever and your symptoms suddenly get worse. MAKE SURE YOU:   Understand these instructions.  Will watch this condition.  Will get help right away if you are not doing well or get worse. Document Released: 11/16/2007 Document Revised: 11/01/2011 Document Reviewed: 08/21/2011 [facility] Patient Information 2015 [place], [place]. This information is not intended to replace advice given to you by your health care provider. Make sure you discuss any questions you have with your health care provider.

## 2014-12-04 ENCOUNTER — Emergency Department (HOSPITAL_COMMUNITY): Payer: Medicare Other

## 2014-12-04 ENCOUNTER — Encounter (HOSPITAL_COMMUNITY): Payer: Self-pay | Admitting: Emergency Medicine

## 2014-12-04 ENCOUNTER — Emergency Department (HOSPITAL_COMMUNITY)
Admission: EM | Admit: 2014-12-04 | Discharge: 2014-12-04 | Disposition: A | Discharge status: Home / Self Care | Payer: Medicare Other | Attending: Emergency Medicine | Admitting: Emergency Medicine

## 2014-12-04 DIAGNOSIS — I10 Essential (primary) hypertension: Secondary | ICD-10-CM | POA: Insufficient documentation

## 2014-12-04 DIAGNOSIS — Y998 Other external cause status: Secondary | ICD-10-CM | POA: Diagnosis not present

## 2014-12-04 DIAGNOSIS — S299XXA Unspecified injury of thorax, initial encounter: Secondary | ICD-10-CM | POA: Diagnosis present

## 2014-12-04 DIAGNOSIS — Z720 Tobacco use: Secondary | ICD-10-CM | POA: Diagnosis not present

## 2014-12-04 DIAGNOSIS — K219 Gastro-esophageal reflux disease without esophagitis: Secondary | ICD-10-CM | POA: Insufficient documentation

## 2014-12-04 DIAGNOSIS — I2510 Atherosclerotic heart disease of native coronary artery without angina pectoris: Secondary | ICD-10-CM | POA: Insufficient documentation

## 2014-12-04 DIAGNOSIS — Z8673 Personal history of transient ischemic attack (TIA), and cerebral infarction without residual deficits: Secondary | ICD-10-CM | POA: Diagnosis not present

## 2014-12-04 DIAGNOSIS — Z7901 Long term (current) use of anticoagulants: Secondary | ICD-10-CM | POA: Diagnosis not present

## 2014-12-04 DIAGNOSIS — I4891 Unspecified atrial fibrillation: Secondary | ICD-10-CM | POA: Insufficient documentation

## 2014-12-04 DIAGNOSIS — S20219A Contusion of unspecified front wall of thorax, initial encounter: Secondary | ICD-10-CM

## 2014-12-04 DIAGNOSIS — Y92091 Bathroom in other non-institutional residence as the place of occurrence of the external cause: Secondary | ICD-10-CM | POA: Insufficient documentation

## 2014-12-04 DIAGNOSIS — Z880 Allergy status to penicillin: Secondary | ICD-10-CM | POA: Insufficient documentation

## 2014-12-04 DIAGNOSIS — W01198A Fall on same level from slipping, tripping and stumbling with subsequent striking against other object, initial encounter: Secondary | ICD-10-CM | POA: Insufficient documentation

## 2014-12-04 DIAGNOSIS — S4992XA Unspecified injury of left shoulder and upper arm, initial encounter: Secondary | ICD-10-CM | POA: Insufficient documentation

## 2014-12-04 DIAGNOSIS — Y92009 Unspecified place in unspecified non-institutional (private) residence as the place of occurrence of the external cause: Secondary | ICD-10-CM

## 2014-12-04 DIAGNOSIS — Y9389 Activity, other specified: Secondary | ICD-10-CM | POA: Insufficient documentation

## 2014-12-04 DIAGNOSIS — Z79899 Other long term (current) drug therapy: Secondary | ICD-10-CM | POA: Diagnosis not present

## 2014-12-04 DIAGNOSIS — W19XXXA Unspecified fall, initial encounter: Secondary | ICD-10-CM

## 2014-12-04 DIAGNOSIS — E785 Hyperlipidemia, unspecified: Secondary | ICD-10-CM | POA: Insufficient documentation

## 2014-12-04 DIAGNOSIS — Z859 Personal history of malignant neoplasm, unspecified: Secondary | ICD-10-CM | POA: Diagnosis not present

## 2014-12-04 LAB — PROTIME-INR
INR: 2.58 — ABNORMAL HIGH (ref 0.00–1.49)
PROTHROMBIN TIME: 27.4 s — AB (ref 11.6–15.2)

## 2014-12-04 NOTE — ED Provider Notes (Signed)
CSN: [id number]     Arrival date & time 12/04/14  1426 History   First MD Initiated Contact with Patient 12/04/14 1526     Chief Complaint  Patient presents with  . Fall      HPI  Pt was seen at 1535. Per pt, c/o sudden onset and resolution of one episode of fall yesterday. States she tripped while in the bathroom and fell against a handrail on the wall. Pt states she did not fall to the ground. Pt c/o left shoulder and elbow "pain." Denies any other injuries. Denies syncope, no LOC, no AMS, no head injury, no neck or back pain, no CP/SOB, no abd pain, no N/V/D, no focal motor weakness, no tingling/numbness in extremities.    Past Medical History  Diagnosis Date  . Hypertension   . Stroke ([place])   . Acid reflux   . Atrial fibrillation ([facility])     tachybradycardia syndrome  . Tobacco abuse 04/30/2011  . Cardiomyopathy 05/02/2011    EF 45-50%, per Echo.  . 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 Carotid artery stenosis 05/02/2011  . CAD (coronary artery disease) 07/20/2006    R/S MV - mild perfusion defect in apical region consistent w/ infarct/scar vs apical thinning; no scintigraphic evidence of inducible myocardial ischemia; baseline EKG demonstrates A Flutter w/ variable AV conduction  . Hyperlipidemia   . Chest pain   . Cerebral atherosclerosis 12/11/2011    Carotid duplex - R ICA 0-49% diameter reduction, velocities suggest high end; L ICA 0-49% reduction, velocities suggest mid range  . Neuropathy ([employer])   . Syncope   . Chronic anticoagulation 11/11/2013  . Orthostatic hypotension 11/11/2013  . Cancer [facility])     facial   Past Surgical History  Procedure Laterality Date  . Cholecystectomy    . Skin cancer excision    . Tonsillectomy    . Tee with cardioversion      failed in past but dates not given   Family History  Problem Relation Age of Onset  . Adopted: Yes   Social History  Substance Use Topics  . Smoking status: Current Every Day Smoker -- 0.50 packs/day  for 40 years    Types: Cigarettes  . Smokeless tobacco: Never Used  . Alcohol Use: No    Review of Systems ROS: Statement: All systems negative except as marked or noted in the HPI; Constitutional: Negative for fever and chills. ; ; Eyes: Negative for eye pain, redness and discharge. ; ; ENMT: Negative for ear pain, hoarseness, nasal congestion, sinus pressure and sore throat. ; ; Cardiovascular: Negative for chest pain, palpitations, diaphoresis, dyspnea and peripheral edema. ; ; Respiratory: Negative for cough, wheezing and stridor. ; ; Gastrointestinal: Negative for nausea, vomiting, diarrhea, abdominal pain, blood in stool, hematemesis, jaundice and rectal bleeding. . ; ; Genitourinary: Negative for dysuria, flank pain and hematuria. ; ; Musculoskeletal: +left arm pain. Negative for back pain and neck pain. Negative for swelling and deformity.; ; Skin: +bruising. Negative for pruritus, rash, abrasions, blisters, and skin lesion.; ; Neuro: Negative for headache, lightheadedness and neck stiffness. Negative for weakness, altered level of consciousness , altered mental status, extremity weakness, paresthesias, involuntary movement, seizure and syncope.      Allergies  Ciprofloxacin; Codeine; Contrast media; Iodine; Lovenox; Nitrofurantoin; Nitrofurantoin monohyd macro; Penicillins; Sulfa antibiotics; and Sulfonamide derivatives  Home Medications   Prior to Admission medications   Medication Sig Start Date End Date Taking? Authorizing Provider  atorvastatin (LIPITOR) 10  MG tablet take 1 tablet by mouth at bedtime 05/21/14   Lorretta Harp, MD  CALCIUM-VITAMIN D PO Take 1 tablet by mouth daily.    Historical Provider, MD  clotrimazole-betamethasone (LOTRISONE) cream as needed.  02/10/14   Historical Provider, MD  docusate sodium 100 MG CAPS Take 100 mg by mouth 2 (two) times daily. Patient taking differently: Take 100 mg by mouth as needed.  11/11/13   Isaiah Serge, NP  esomeprazole (NEXIUM)  20 MG capsule Take 20 mg by mouth daily before breakfast.    Historical Provider, MD  estazolam (PROSOM) 2 MG tablet Take 2 mg by mouth at bedtime.    Historical Provider, MD  metoprolol tartrate (LOPRESSOR) 25 MG tablet Take 12.5 mg by mouth daily.    Historical Provider, MD  MYRBETRIQ 25 MG TB24 Take 25 mg by mouth daily. 06/27/12   Historical Provider, MD  niacin 500 MG tablet Take 500 mg by mouth daily.    Historical Provider, MD  NITROSTAT 0.4 MG SL tablet Place 1 tablet (0.4 mg total) under the tongue every 5 (five) minutes as needed for chest pain. 05/13/14   Lorretta Harp, MD  oxybutynin (DITROPAN-XL) 10 MG 24 hr tablet Take 10 mg by mouth at bedtime.    Historical Provider, MD  polyethylene glycol (MIRALAX / GLYCOLAX) packet Take 17 g by mouth daily. Patient taking differently: Take 17 g by mouth daily as needed.  11/11/13   Isaiah Serge, NP  Potassium 99 MG TABS Take 1 tablet by mouth daily.    Historical Provider, MD  prazosin (MINIPRESS) 2 MG capsule Take 2 mg by mouth 2 (two) times daily.     Historical Provider, MD  traMADol-acetaminophen (ULTRACET) 37.5-325 MG per tablet Take 37.5 tablets by mouth daily as needed for moderate pain or severe pain (*May take two tablets three times daily for hip pain).  11/03/13   Historical Provider, MD  triamcinolone cream (KENALOG) 0.1 %  02/26/14   Historical Provider, MD  trimethoprim (TRIMPEX) 100 MG tablet Take 100 mg by mouth daily.  01/07/13   Historical Provider, MD  warfarin (COUMADIN) 3 MG tablet Dr Gerarda Fraction follows; alternates 3mg  with 4mg  02/23/14   Historical Provider, MD  warfarin (COUMADIN) 4 MG tablet Take 1 tablet (4 mg total) by mouth every evening. Holding for 2 days then recheck at PCP 11/13/13   Isaiah Serge, NP   BP 124/64 mmHg  Pulse 62  Temp(Src) 98.4 F (36.9 C)  Resp 16  Ht 5\' 2"  (1.575 m)  Wt 105 lb (47.628 kg)  BMI 19.20 kg/m2  SpO2 100% Physical Exam  1540: Physical examination:  Nursing notes reviewed; Vital signs  and O2 SAT reviewed;  Constitutional: Thin, frail. Well hydrated, In no acute distress; Head:  Normocephalic, atraumatic; Eyes: EOMI, PERRL, No scleral icterus; ENMT: Mouth and pharynx normal, Mucous membranes moist; Neck: Supple, Full range of motion, No ecchymosis or abrasions.; Cardiovascular: Regular rate and rhythm, No gallop; Respiratory: Breath sounds clear & equal bilaterally, No wheezes.  Speaking full sentences with ease, Normal respiratory effort/excursion; Chest: +ecchymosis in various stages to upper mid-sternal and parasternal areas. No open wounds. No deformity, no erythema, no soft tissue crepitus. Nontender to palp. Movement normal; Abdomen: Soft, Nontender, Nondistended, Normal bowel sounds. No ecchymosis or abrasions.; Genitourinary: No CVA tenderness; Spine:  No midline CS, TS, LS tenderness. No ecchymosis or abrasions.;; Extremities: Pulses normal, Pelvis stable. NT left shoulder/elbow/wrist/hand, without deformity, no ecchymosis, no edema, no erythema.  FROM without pain or tenderness to palp LUE. No edema, No calf edema or asymmetry.; Neuro: AA&Ox3, Major CN grossly intact. No facial droop. Speech clear. No gross focal motor or sensory deficits in extremities.; Skin: Color normal, Warm, Dry.   ED Course  Procedures (including critical care time) Labs Review   Imaging Review  I have personally reviewed and evaluated these images and lab results as part of my medical decision-making.   EKG Interpretation None      MDM  MDM Reviewed: previous chart, nursing note and vitals Interpretation: x-ray      Results for orders placed or performed during the hospital encounter of 12/04/14  Protime-INR  Result Value Ref Range   Prothrombin Time 27.4 (H) 11.6 - 15.2 seconds   INR 2.58 (H) 0.00 - 1.49   Dg Ribs Unilateral W/chest Left 12/04/2014  CLINICAL DATA:  Fall today, LEFT rib pain. EXAM: LEFT RIBS AND CHEST - 3+ VIEW COMPARISON:  Radiograph 11/05/2013 FINDINGS: Cardiac  silhouette is enlarged. This elevation LEFT hemidiaphragm. There is improved aeration at the LEFT lung base compared to comparison exam. No pneumothorax. Dedicated views of the LEFT ribs demonstrate no displaced fracture. IMPRESSION: No evidence of thoracic trauma or LEFT rib fracture. Improvement in aeration to the LEFT lung base. Electronically Signed   By: Suzy Bouchard M.D.   On: 12/04/2014 16:36   Dg Elbow Complete Left 12/04/2014  CLINICAL DATA:  Fall today, pain under left arm.  Left elbow pain. EXAM: LEFT ELBOW - COMPLETE 3+ VIEW COMPARISON:  None. FINDINGS: No acute bony abnormality. Specifically, no fracture, subluxation, or dislocation. Soft tissues are intact. IMPRESSION: No acute bony abnormality. Electronically Signed   By: Rolm Baptise M.D.   On: 12/04/2014 16:35   Dg Shoulder Left 12/04/2014  CLINICAL DATA:  79 year old female with acute left shoulder pain following fall yesterday. Initial encounter. EXAM: LEFT SHOULDER - 2+ VIEW COMPARISON:  11/05/2013 and prior chest radiographs. FINDINGS: There is no evidence of acute fracture, subluxation or dislocation. Degenerative changes at the [facility] and glenohumeral joints noted. No focal bony lesions are present. IMPRESSION: No evidence of bony abnormality. Degenerative changes of the [facility] and glenohumeral joints. Electronically Signed   By: Margarette Canada M.D.   On: 12/04/2014 15:19    1745:  XR reassuring. Abd remains benign, VSS. Pt wants to go home now. Dx and testing d/w pt and family.  Questions answered.  Verb understanding, agreeable to d/c home with outpt f/u.   Francine Graven, DO 12/07/14 [phone]

## 2014-12-04 NOTE — ED Notes (Signed)
Pt states that she fell in the bathroom last night and is c/o left arm and shoulder pain.  Also has a bruise to neck.  States that she tripped and did not lose consciousness.

## 2014-12-04 NOTE — Discharge Instructions (Signed)
°Emergency Department Resource Guide °1) Find a Doctor and Pay Out of Pocket °Although you won't have to find out who is covered by your insurance plan, it is a good idea to ask around and get recommendations. You will then need to call the office and see if the doctor you have chosen will accept you as a new patient and what types of options they offer for patients who are self-pay. Some doctors offer discounts or will set up payment plans for their patients who do not have insurance, but you will need to ask so you aren't surprised when you get to your appointment. ° °2) Contact Your Local Health Department °Not all health departments have doctors that can see patients for sick visits, but many do, so it is worth a call to see if yours does. If you don't know where your local health department is, you can check in your phone book. The CDC also has a tool to help you locate your state's health department, and many state websites also have listings of all of their local health departments. ° °3) Find a Walk-in Clinic °If your illness is not likely to be very severe or complicated, you may want to try a walk in clinic. These are popping up all over the country in pharmacies, drugstores, and shopping centers. They're usually staffed by nurse practitioners or physician assistants that have been trained to treat common illnesses and complaints. They're usually fairly quick and inexpensive. However, if you have serious medical issues or chronic medical problems, these are probably not your best option. ° °No Primary Care Doctor: °- Call [facility] at  832-8000 - they can help you locate a primary care doctor that  accepts your insurance, provides certain services, etc. °- Physician Referral Service- 1-800-533-3463 ° °Chronic Pain Problems: °Organization         Address  Phone   Notes  ° [facility]  (336) 297-2271 Patients need to be referred by their primary care doctor.  ° °Medication  Assistance: °Organization         Address  Phone   Notes  °[place] [facility] [address] °[place], [place] [address] (336) 641-8030 --Must be a resident of [place] °-- Must have NO insurance coverage whatsoever (no Medicaid/ Medicare, etc.) °-- The pt. MUST have a primary care doctor that directs their care regularly and follows them in the community °  °[facility]  (866) 331-1348   °[employer]  (888) 892-1162   ° °Agencies that provide inexpensive medical care: °Organization         Address  Phone   Notes  °[facility]  (336) 832-8035   °[facility]    (336) 832-7272   °[facility] [address] °[place], [place] [address] (336) 832-4777   °[facility] [address], °[place] (336) 271-4999   °[facility]    (336) 373-0678   °[facility]    (336) 272-1050   °[facility] ° [address], [place] Phone:  (336) 832-4444, Fax:  (336) 832-4440 Hours of Operation:  9 am - 6 pm, M-F.  Also accepts Medicaid/Medicare and self-pay.  °[facility] ° [address], [place] Phone: (336) [phone], Fax: (336) [phone]. Hours of Operation:  8:30 am - 5:30 pm, M-F.  Also accepts Medicaid and self-pay.  °[facility] [place] [address], [place] Phone: (336) 878-6027   °[facility] [address], [place], [place] (336)723-1848, Ext. 123 Mondays & Thursdays: 7-9 AM.  First 15 patients are seen on a first come, first serve basis. °  ° °Medicaid-accepting [place] Providers: ° °Organization         Address  Phone   Notes  °[facility] [address], [place] (336) 641-2100 Also accepts self-pay patients.  °[facility] [address], [place] ° (336) 856-9996   °[facility] [address], [place]  (336) 288-8857   °[facility] [address] [place] [address], [place] (336) 299-7000   °Veita Bland [address], [place]  ° (336) 373-1557 Only accepts [place] Access Medicaid patients after they have their name applied to their card.  ° °Self-Pay (no insurance) in [place]: ° °Organization         Address  Phone   Notes  °Sickle Cell Patients, [facility] [address], [place] (336) 832-1970   °[facility] [address], [place] (336) 832-4400   ° [facility] ° [address], [place] (336) 992-4800   °[facility]/Dr. Osei-Bonsu ° [address] [place] [address], [place] or [address], [place] (336) [phone] Phone number for both [place] and [place] locations is the same.  °[facility] [address], [place] (336) 299-0000   °[facility] [address] [place] [address], [place] or [address] (336) 852-7530 °(336) 878-2260   °[facility] [address], [place] (336) 350-1642, phone; (336) 294-5005, fax Sees patients 1st and 3rd Saturday of every month.  Must not qualify for public or private insurance (i.e. Medicaid, Medicare, [place] Health Choice, Veterans' Benefits) • Household income should be no more than 200% of the poverty level •The clinic cannot treat you if you are pregnant or think you are pregnant • Sexually transmitted diseases are not treated at the clinic.  ° ° °Dental Care: °Organization         Address  Phone  Notes  °[place] [facility] [address],  (336) 641-6152 Accepts children up to age 21 who are enrolled in Medicaid or [place] Health Choice; pregnant women with a Medicaid card; and children who have applied for Medicaid or [place] Health Choice, but were declined, whose parents can pay a reduced fee at time of service.  °[place]  [facility] [place]  [address], [place] (336) 641-7733 Accepts children up to age 21 who are enrolled in Medicaid or [place] Health Choice; pregnant women with a Medicaid card; and children who have applied for Medicaid or  Health Choice, but were declined, whose parents can pay a reduced fee at time of service.  °[facility] ° [address],  (336) 641-4533 Patients are seen by appointment only. Walk-ins are not accepted. [facility] will see patients 18 years of age and older. °Monday - Tuesday (8am-5pm) °Most Wednesdays (8:30-5pm) °$30 per visit, cash only  °[facility] ° [address], [place] (336) 641-4533 Patients are seen by appointment only. Walk-ins are not accepted. [facility] will see patients 18 years of age and older. °One   Wednesday Evening (Monthly: Volunteer Based).  $30 per visit, cash only  °[facility]  (919) 537-3737 for adults; Children under age 4, call Graduate Pediatric Dentistry at ([phone]) [phone]. Children aged 4-14, please call (919) [phone] to request a pediatric application. ° Dental services are provided in all areas of dental care including fillings, crowns and bridges, complete and partial dentures, implants, gum treatment, root canals, and extractions. Preventive care is also provided. Treatment is provided to both adults and children. °Patients are selected via a lottery and there is often a waiting list. °  °[facility] [address], °[place] ° (336) 763-8833 [email or web address] °  °[facility] [address], [place], [place] (336)723-1848, Ext. 123 Second and Fourth Thursday of each month, opens at 6:30 AM; Clinic ends at 9 AM.  Patients are seen on a first-come first-served basis, and a limited number are seen during each clinic.  ° °[facility] ° [address], [place], [place] (336) [phone]    Eligibility Requirements °You must have lived in [place], [place], or [place] counties for at least the last three months. °  You cannot be eligible for state or federal sponsored healthcare insurance, including Veterans Administration, Medicaid, or Medicare. °  You generally cannot be eligible for healthcare insurance through your employer.  °  How to apply: °Eligibility screenings are held every Tuesday and Wednesday afternoon from 1:00 pm until 4:00 pm. You do not need an appointment for the interview!  °[facility] [address], [place], [place] 336-631-2330   °[facility] [facility]  336-342-8273   °[facility]  336-703-3100   °[facility]  336-570-6415   ° °Behavioral Health Resources in the Community: °Intensive Outpatient Programs °Organization         Address  Phone  Notes  °[facility] [address], [place], [place] 336-878-6098   °[facility] [address], [place], [place] 336-832-9800   °[facility] [address], [place], [place] ° 336-882-2125   °[facility] [address],  °[place], [place] 1-800-853-5163 or 336-641-4981   °Substance Abuse Resources °Organization         Address  Phone  Notes  °[facility]  336-882-2125   °[facility]  336-784-9470   °[facility]  336-285-9073   °[facility]  336-845-3988   °Residential & Outpatient Substance Abuse Program  1-800-659-3381   °Psychological Services °Organization         Address  Phone  Notes  °[facility]  336- 832-9600   °[facility]  336- 378-7881   °[facility] [address], [place] 1-800-853-5163 or 336-641-4981   ° °Mobile Crisis Teams °Organization         Address  Phone  Notes  °[facility], Mobile Crisis Care Unit  1-877-626-1772   °Assertive °[facility] ° [address]  [place], [place] 336-834-9664   °Sharon DeEsch [address] °[place] 336-554-5454   ° °Self-Help/Support Groups °Organization         Address  Phone             Notes  °[facility]  - variety of support groups  336- 373-1402 Call for more information  °Narcotics Anonymous (NA), [facility] [address], °[place]  2 meetings at this location  ° °  Residential Treatment Programs °Organization         Address  Phone  Notes  °[facility] [address],    °[place]  1-866-801-8205   °[facility] ° [address], [place], [place] 704-293-8524   °[facility] [address], [place] 336-845-3988 Admissions: 8am-3pm M-F  °[facility] [address]    °[place], [place] 336-841-1104   °[facility] [address], [place], [place] 336-379-7146   °[facility] [address]  °[place], [place] 336-285-9073   °[facility] Intensive Outpatient [address], [place], [place] 336-852-3033   °[facility] [address]  °[place], [place] 1-877-615-2722 or 336-784-9470   °[facility] [address] [place], [place] 336-227-7417 Accepts Medicaid  °[facility] [address]  °[place] 1-800-659-3381 Substance Abuse/Addiction Treatment  ° °[place] Behavioral Health Resources °Organization         Address  Phone  Notes  °[facility]  (888) 581-9988   °Julie Brannon, PhD [address] [place], [place]   (336) 349-5553 or (336) 951-0000   °[facility]   [address] °[place], [place] (336) 349-4454   °[facility] [address], [place], [place] (336) 342-8316 Insurance/Medicaid/sponsorship through [facility]  °[facility] [address]                                    [place], [place] (336) 342-8316 Therapy/tele-psych/case    °[facility] [address]  ° [place],  (336) 349-2233    °Dr. Arfeen  (336) 349-4544   °[facility] [place]  [employer] [place] [facility] 1) [address], [place] °2) [address], [place] °3)  [address], [place] (336) 349-3220 °(336) 342-7768 ° °(336) 342-8140   °[place] Child Abuse [facility] (336) 342-1394 or (336) 342-3537 (After Hours)    ° ° ° °Take your usual prescriptions as previously directed.  Apply moist heat or ice to the area(s) of discomfort, for 15 minutes at a time, several times per day for the next few days.  Do not fall asleep on a heating or ice pack.  Call your regular medical doctor on Monday to schedule a follow up appointment in the next 2 days.  Return to the Emergency Department immediately if worsening. ° °

## 2014-12-14 ENCOUNTER — Inpatient Hospital Stay (HOSPITAL_COMMUNITY)
Admission: EM | Admit: 2014-12-14 | Discharge: 2014-12-17 | DRG: 389 | Disposition: A | Discharge status: Home Health | Payer: Medicare Other | Attending: Internal Medicine | Admitting: Internal Medicine

## 2014-12-14 ENCOUNTER — Emergency Department (HOSPITAL_COMMUNITY): Payer: Medicare Other

## 2014-12-14 ENCOUNTER — Encounter (HOSPITAL_COMMUNITY): Payer: Self-pay | Admitting: Emergency Medicine

## 2014-12-14 DIAGNOSIS — F0390 Unspecified dementia without behavioral disturbance: Secondary | ICD-10-CM | POA: Diagnosis present

## 2014-12-14 DIAGNOSIS — K5900 Constipation, unspecified: Secondary | ICD-10-CM | POA: Diagnosis present

## 2014-12-14 DIAGNOSIS — Z681 Body mass index (BMI) 19 or less, adult: Secondary | ICD-10-CM

## 2014-12-14 DIAGNOSIS — I714 Abdominal aortic aneurysm, without rupture, unspecified: Secondary | ICD-10-CM

## 2014-12-14 DIAGNOSIS — E785 Hyperlipidemia, unspecified: Secondary | ICD-10-CM | POA: Diagnosis present

## 2014-12-14 DIAGNOSIS — E861 Hypovolemia: Secondary | ICD-10-CM | POA: Diagnosis present

## 2014-12-14 DIAGNOSIS — I9589 Other hypotension: Secondary | ICD-10-CM | POA: Diagnosis not present

## 2014-12-14 DIAGNOSIS — Z85828 Personal history of other malignant neoplasm of skin: Secondary | ICD-10-CM | POA: Diagnosis not present

## 2014-12-14 DIAGNOSIS — I959 Hypotension, unspecified: Secondary | ICD-10-CM | POA: Diagnosis present

## 2014-12-14 DIAGNOSIS — I69320 Aphasia following cerebral infarction: Secondary | ICD-10-CM | POA: Diagnosis not present

## 2014-12-14 DIAGNOSIS — R4701 Aphasia: Secondary | ICD-10-CM | POA: Diagnosis present

## 2014-12-14 DIAGNOSIS — F17210 Nicotine dependence, cigarettes, uncomplicated: Secondary | ICD-10-CM | POA: Diagnosis present

## 2014-12-14 DIAGNOSIS — R1110 Vomiting, unspecified: Secondary | ICD-10-CM | POA: Diagnosis not present

## 2014-12-14 DIAGNOSIS — K5660 Unspecified intestinal obstruction: Principal | ICD-10-CM | POA: Diagnosis present

## 2014-12-14 DIAGNOSIS — R112 Nausea with vomiting, unspecified: Secondary | ICD-10-CM

## 2014-12-14 DIAGNOSIS — E860 Dehydration: Secondary | ICD-10-CM | POA: Diagnosis present

## 2014-12-14 DIAGNOSIS — I480 Paroxysmal atrial fibrillation: Secondary | ICD-10-CM | POA: Diagnosis not present

## 2014-12-14 DIAGNOSIS — R64 Cachexia: Secondary | ICD-10-CM | POA: Diagnosis present

## 2014-12-14 DIAGNOSIS — K219 Gastro-esophageal reflux disease without esophagitis: Secondary | ICD-10-CM | POA: Diagnosis present

## 2014-12-14 DIAGNOSIS — I482 Chronic atrial fibrillation: Secondary | ICD-10-CM

## 2014-12-14 DIAGNOSIS — Z7901 Long term (current) use of anticoagulants: Secondary | ICD-10-CM | POA: Diagnosis not present

## 2014-12-14 DIAGNOSIS — R1033 Periumbilical pain: Secondary | ICD-10-CM

## 2014-12-14 DIAGNOSIS — N179 Acute kidney failure, unspecified: Secondary | ICD-10-CM | POA: Diagnosis present

## 2014-12-14 DIAGNOSIS — K56609 Unspecified intestinal obstruction, unspecified as to partial versus complete obstruction: Secondary | ICD-10-CM | POA: Diagnosis present

## 2014-12-14 DIAGNOSIS — I10 Essential (primary) hypertension: Secondary | ICD-10-CM | POA: Diagnosis present

## 2014-12-14 DIAGNOSIS — K5669 Other intestinal obstruction: Secondary | ICD-10-CM | POA: Diagnosis not present

## 2014-12-14 DIAGNOSIS — I4891 Unspecified atrial fibrillation: Secondary | ICD-10-CM | POA: Diagnosis present

## 2014-12-14 DIAGNOSIS — I481 Persistent atrial fibrillation: Secondary | ICD-10-CM | POA: Diagnosis not present

## 2014-12-14 LAB — COMPREHENSIVE METABOLIC PANEL
ALT: 20 U/L (ref 14–54)
AST: 29 U/L (ref 15–41)
Albumin: 4.4 g/dL (ref 3.5–5.0)
Alkaline Phosphatase: 81 U/L (ref 38–126)
Anion gap: 14 (ref 5–15)
BUN: 27 mg/dL — ABNORMAL HIGH (ref 6–20)
CHLORIDE: 92 mmol/L — AB (ref 101–111)
CO2: 27 mmol/L (ref 22–32)
CREATININE: 1.42 mg/dL — AB (ref 0.44–1.00)
Calcium: 10.2 mg/dL (ref 8.9–10.3)
GFR, EST AFRICAN AMERICAN: 40 mL/min — AB (ref >60)
GFR, EST NON AFRICAN AMERICAN: 35 mL/min — AB (ref >60)
Glucose, Bld: 115 mg/dL — ABNORMAL HIGH (ref 65–99)
POTASSIUM: 3.9 mmol/L (ref 3.5–5.1)
Sodium: 133 mmol/L — ABNORMAL LOW (ref 135–145)
Total Bilirubin: 1.3 mg/dL — ABNORMAL HIGH (ref 0.3–1.2)
Total Protein: 7.9 g/dL (ref 6.5–8.1)

## 2014-12-14 LAB — CBC WITH DIFFERENTIAL/PLATELET
Basophils Absolute: 0 10*3/uL (ref 0.0–0.1)
Basophils Relative: 0 %
EOS PCT: 1 %
Eosinophils Absolute: 0.1 10*3/uL (ref 0.0–0.7)
HCT: 43.9 % (ref 36.0–46.0)
Hemoglobin: 15.3 g/dL — ABNORMAL HIGH (ref 12.0–15.0)
LYMPHS ABS: 1 10*3/uL (ref 0.7–4.0)
LYMPHS PCT: 11 %
MCH: 31.1 pg (ref 26.0–34.0)
MCHC: 34.9 g/dL (ref 30.0–36.0)
MCV: 89.2 fL (ref 78.0–100.0)
MONO ABS: 0.5 10*3/uL (ref 0.1–1.0)
Monocytes Relative: 6 %
Neutro Abs: 7.5 10*3/uL (ref 1.7–7.7)
Neutrophils Relative %: 82 %
PLATELETS: 234 10*3/uL (ref 150–400)
RBC: 4.92 MIL/uL (ref 3.87–5.11)
RDW: 13.7 % (ref 11.5–15.5)
WBC: 9.1 10*3/uL (ref 4.0–10.5)

## 2014-12-14 LAB — I-STAT CHEM 8, ED
BUN: 25 mg/dL — ABNORMAL HIGH (ref 6–20)
CALCIUM ION: 1.13 mmol/L (ref 1.13–1.30)
CHLORIDE: 93 mmol/L — AB (ref 101–111)
Creatinine, Ser: 1.5 mg/dL — ABNORMAL HIGH (ref 0.44–1.00)
Glucose, Bld: 116 mg/dL — ABNORMAL HIGH (ref 65–99)
HCT: 48 % — ABNORMAL HIGH (ref 36.0–46.0)
Hemoglobin: 16.3 g/dL — ABNORMAL HIGH (ref 12.0–15.0)
Potassium: 3.8 mmol/L (ref 3.5–5.1)
SODIUM: 133 mmol/L — AB (ref 135–145)
TCO2: 25 mmol/L (ref 0–100)

## 2014-12-14 LAB — URINALYSIS, ROUTINE W REFLEX MICROSCOPIC
GLUCOSE, UA: NEGATIVE mg/dL
HGB URINE DIPSTICK: NEGATIVE
Nitrite: NEGATIVE
Specific Gravity, Urine: 1.015 (ref 1.005–1.030)
Urobilinogen, UA: 1 mg/dL (ref 0.0–1.0)
pH: 7 (ref 5.0–8.0)

## 2014-12-14 LAB — URINE MICROSCOPIC-ADD ON

## 2014-12-14 LAB — TROPONIN I: TROPONIN I: 0.04 ng/mL — AB (ref <0.031)

## 2014-12-14 LAB — PROTIME-INR
INR: 3.08 — ABNORMAL HIGH (ref 0.00–1.49)
Prothrombin Time: 31.2 seconds — ABNORMAL HIGH (ref 11.6–15.2)

## 2014-12-14 LAB — MRSA PCR SCREENING: MRSA BY PCR: NEGATIVE

## 2014-12-14 LAB — I-STAT CG4 LACTIC ACID, ED: LACTIC ACID, VENOUS: 1.46 mmol/L (ref 0.5–2.0)

## 2014-12-14 MED ORDER — ONDANSETRON HCL 4 MG PO TABS: 4.0000 mg | ORAL_TABLET | Freq: Four times a day (QID) | ORAL | Status: DC | PRN

## 2014-12-14 MED ORDER — CETYLPYRIDINIUM CHLORIDE 0.05 % MT LIQD
7.0000 mL | Freq: Two times a day (BID) | OROMUCOSAL | Status: DC
  Administered 2014-12-15 – 2014-12-17 (×5): 7 mL via OROMUCOSAL

## 2014-12-14 MED ORDER — SODIUM CHLORIDE 0.9 % IV SOLN
INTRAVENOUS | Status: DC
Start: 2014-12-14 — End: 2014-12-17
  Administered 2014-12-14 – 2014-12-16 (×4): via INTRAVENOUS

## 2014-12-14 MED ORDER — SODIUM CHLORIDE 0.9 % IV BOLUS (SEPSIS): 1000.0000 mL | Freq: Once | INTRAVENOUS | Status: DC

## 2014-12-14 MED ORDER — SODIUM CHLORIDE 0.9 % IV BOLUS (SEPSIS)
500.0000 mL | Freq: Once | INTRAVENOUS | Status: AC
Start: 2014-12-14 — End: 2014-12-14
  Administered 2014-12-14: 500 mL via INTRAVENOUS

## 2014-12-14 MED ORDER — CHLORHEXIDINE GLUCONATE 0.12 % MT SOLN
15.0000 mL | Freq: Two times a day (BID) | OROMUCOSAL | Status: DC
  Administered 2014-12-14 – 2014-12-17 (×5): 15 mL via OROMUCOSAL
  Filled 2014-12-14 (×3): qty 15

## 2014-12-14 MED ORDER — SODIUM CHLORIDE 0.9 % IJ SOLN
3.0000 mL | Freq: Two times a day (BID) | INTRAMUSCULAR | Status: DC
  Administered 2014-12-14 – 2014-12-17 (×3): 3 mL via INTRAVENOUS

## 2014-12-14 MED ORDER — ONDANSETRON HCL 4 MG/2ML IJ SOLN: 4.0000 mg | Freq: Four times a day (QID) | INTRAMUSCULAR | Status: DC | PRN

## 2014-12-14 MED ORDER — SODIUM CHLORIDE 0.9 % IV BOLUS (SEPSIS)
500.0000 mL | Freq: Once | INTRAVENOUS | Status: AC
  Administered 2014-12-14: 1000 mL via INTRAVENOUS

## 2014-12-14 NOTE — H&P (Signed)
[facility] History and Physical  CHRISTIN MOLINE KAJ:[id number] DOB: [birth year] DOA: 12/14/2014  Referring physician: ER PCP: Collene Mares, PA-C   Chief Complaint: Nausea ,vomiting.  HPI: Andrea [name] is a 79 y.o. female  This is a 79 year old lady who has aphasia from previous stroke and presents now with vomiting, 4 times in last 24 hours associated with constipation. She has a history of hypertension but was found to be hypotensive in the emergency room. According to the husband, who is at the bedside, she also had abdominal pain around her umbilicus. She appeared to have an umbilical hernia on presentation to the emergency room, which was reduced. Her symptoms seem to improve after this but CT scan suggests small bowel obstruction. She is now being admitted for further management. There is no fever, diarrhea.   Review of Systems:  Unable to obtain review of systems from the patient due to her aphasia  Past Medical History  Diagnosis Date  . Hypertension   . Stroke ([place])   . Acid reflux   . Atrial fibrillation ([facility])     tachybradycardia syndrome  . Tobacco abuse 04/30/2011  . Cardiomyopathy 05/02/2011    EF 45-50%, per Echo.  . 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 Carotid artery stenosis 05/02/2011  . CAD (coronary artery disease) 07/20/2006    R/S MV - mild perfusion defect in apical region consistent w/ infarct/scar vs apical thinning; no scintigraphic evidence of inducible myocardial ischemia; baseline EKG demonstrates A Flutter w/ variable AV conduction  . Hyperlipidemia   . Chest pain   . Cerebral atherosclerosis 12/11/2011    Carotid duplex - R ICA 0-49% diameter reduction, velocities suggest high end; L ICA 0-49% reduction, velocities suggest mid range  . Neuropathy ([place])   . Syncope   . Chronic anticoagulation 11/11/2013  . Orthostatic hypotension 11/11/2013  . Cancer [facility])     facial   Past Surgical History  Procedure Laterality Date  .  Cholecystectomy    . Skin cancer excision    . Tonsillectomy    . Tee with cardioversion      failed in past but dates not given   Social History:  reports that she has been smoking Cigarettes.  She has a 20 pack-year smoking history. She has never used smokeless tobacco. She reports that she does not drink alcohol or use illicit drugs.  Allergies  Allergen Reactions  . Ciprofloxacin Anaphylaxis and Rash  . Codeine   . Contrast Media [Iodinated Diagnostic Agents]     Per pt chart.  . Iodine   . Lovenox [Enoxaparin] Other (See Comments)    Unknown reaction  . Nitrofurantoin Other (See Comments)    Numbness in arms and legs  . Nitrofurantoin Monohyd Macro Other (See Comments)  . Penicillins Rash  . Sulfa Antibiotics Rash  . Sulfonamide Derivatives Rash    Family History  Problem Relation Age of Onset  . Adopted: Yes    Prior to Admission medications   Medication Sig Start Date End Date Taking? Authorizing Provider  atorvastatin (LIPITOR) 10 MG tablet take 1 tablet by mouth at bedtime 05/21/14   Lorretta Harp, MD  CALCIUM-VITAMIN D PO Take 1 tablet by mouth daily.    Historical Provider, MD  clotrimazole-betamethasone (LOTRISONE) cream Apply 1 application topically daily as needed (for irritation).  02/10/14   Historical Provider, MD  docusate sodium 100 MG CAPS Take 100 mg by mouth 2 (two) times daily. Patient taking differently: Take  100 mg by mouth daily as needed (for constipation).  11/11/13   Isaiah Serge, NP  esomeprazole (NEXIUM) 20 MG capsule Take 20 mg by mouth daily before breakfast.    Historical Provider, MD  estazolam (PROSOM) 2 MG tablet Take 2 mg by mouth at bedtime.    Historical Provider, MD  metoprolol tartrate (LOPRESSOR) 25 MG tablet Take 12.5 mg by mouth daily.    Historical Provider, MD  MYRBETRIQ 25 MG TB24 Take 25 mg by mouth daily. 06/27/12   Historical Provider, MD  niacin 500 MG tablet Take 500 mg by mouth daily.    Historical Provider, MD    NITROSTAT 0.4 MG SL tablet Place 1 tablet (0.4 mg total) under the tongue every 5 (five) minutes as needed for chest pain. 05/13/14   Lorretta Harp, MD  oxybutynin (DITROPAN-XL) 10 MG 24 hr tablet Take 10 mg by mouth every morning.     Historical Provider, MD  oxyCODONE-acetaminophen (PERCOCET/ROXICET) 5-325 MG tablet Take 1 tablet by mouth 4 (four) times daily as needed. For pain 12/09/14   Historical Provider, MD  polyethylene glycol (MIRALAX / GLYCOLAX) packet Take 17 g by mouth daily. Patient taking differently: Take 17 g by mouth daily as needed.  11/11/13   Isaiah Serge, NP  Potassium 99 MG TABS Take 1 tablet by mouth daily.    Historical Provider, MD  prazosin (MINIPRESS) 2 MG capsule Take 2 mg by mouth 2 (two) times daily.     Historical Provider, MD  sertraline (ZOLOFT) 50 MG tablet Take 50 mg by mouth daily. 11/17/14   Historical Provider, MD  traMADol-acetaminophen (ULTRACET) 37.5-325 MG per tablet Take 37.5 tablets by mouth daily as needed for moderate pain or severe pain (*May take two tablets three times daily for hip pain).  11/03/13   Historical Provider, MD  triamcinolone cream (KENALOG) 0.1 % Apply 1 application topically daily as needed (for itching/irritation).  02/26/14   Historical Provider, MD  trimethoprim (TRIMPEX) 100 MG tablet Take 100 mg by mouth daily.  01/07/13   Historical Provider, MD  warfarin (COUMADIN) 3 MG tablet Dr Gerarda Fraction follows; alternates 3mg  with 4mg  02/23/14   Historical Provider, MD  warfarin (COUMADIN) 4 MG tablet Take 1 tablet (4 mg total) by mouth every evening. Holding for 2 days then recheck at PCP 11/13/13   Isaiah Serge, NP   Physical Exam: Filed Vitals:   12/14/14 1430 12/14/14 1500 12/14/14 1530 12/14/14 1608  BP: 99/56 115/53 121/68   Pulse: 79 71    Temp:    99 F (37.2 C)  TempSrc:    Rectal  Resp: 13 14    Height:      Weight:      SpO2: 97% 97%      Wt Readings from Last 3 Encounters:  12/14/14 47.628 kg (105 lb)  12/04/14 47.628  kg (105 lb)  05/13/14 47.174 kg (104 lb)    General:  Appears calm and comfortable. She is hypotensive and clinically dehydrated. Eyes: PERRL, normal lids, irises & conjunctiva ENT: grossly normal hearing, lips & tongue Neck: no LAD, masses or thyromegaly Cardiovascular: RRR, no m/r/g. No LE edema. Telemetry: SR, no arrhythmias  Respiratory: CTA bilaterally, no w/r/r. Normal respiratory effort. Abdomen: soft, ntnd. The abdomen is not swollen. Bowel sounds are scanty but the abdomen is soft and nontender. There are no clinical signs of an acute abdomen. Skin: no rash or induration seen on limited exam Musculoskeletal: grossly normal tone BUE/BLE Psychiatric:  Not examined. Neurologic: grossly non-focal.          Labs on Admission:  Basic Metabolic Panel:  Recent Labs Lab 12/14/14 1438 12/14/14 1445  NA 133* 133*  K 3.9 3.8  CL 92* 93*  CO2 27  --   GLUCOSE 115* 116*  BUN 27* 25*  CREATININE 1.42* 1.50*  CALCIUM 10.2  --    Liver Function Tests:  Recent Labs Lab 12/14/14 1438  AST 29  ALT 20  ALKPHOS 81  BILITOT 1.3*  PROT 7.9  ALBUMIN 4.4   No results for input(s): LIPASE, AMYLASE in the last 168 hours. No results for input(s): AMMONIA in the last 168 hours. CBC:  Recent Labs Lab 12/14/14 1438 12/14/14 1445  WBC 9.1  --   NEUTROABS 7.5  --   HGB 15.3* 16.3*  HCT 43.9 48.0*  MCV 89.2  --   PLT 234  --    Cardiac Enzymes:  Recent Labs Lab 12/14/14 1438  TROPONINI 0.04*    BNP (last 3 results) No results for input(s): BNP in the last 8760 hours.  ProBNP (last 3 results) No results for input(s): PROBNP in the last 8760 hours.  CBG: No results for input(s): GLUCAP in the last 168 hours.  Radiological Exams on Admission: Ct Abdomen Pelvis Wo Contrast  12/14/2014  CLINICAL DATA:  79 year old with hypotension. Abdominal pain and vomiting. EXAM: CT ABDOMEN AND PELVIS WITHOUT CONTRAST TECHNIQUE: Multidetector CT imaging of the abdomen and pelvis  was performed following the standard protocol without IV contrast. COMPARISON:  None. FINDINGS: Lower chest: Volume loss in the left lung related to elevated left hemidiaphragm. Hepatobiliary: No gross abnormality to the liver on this noncontrast examination. Difficult to know if the patient has a gallbladder due to fluid-filled loops of bowel in the right upper abdomen. Pancreas: No gross abnormality to pancreas on this noncontrast examination. Spleen: Normal appearance of the spleen. Adrenals/Urinary Tract: Mild fullness of the right adrenal gland. No gross abnormality to the left adrenal gland. High-density structures along the medial cortex of left kidney, with Hounsfield units of 57. These could represent proteinaceous or hyperdense cysts but indeterminate. There is probably a low-density cyst along the left kidney lower pole but this is poorly characterized. Question a small cyst in the right kidney upper pole. No significant hydronephrosis in either kidney. There is fluid in the urinary bladder without gross abnormality. Stomach/Bowel: Marked elevation of the left hemidiaphragm. Distal stomach may be narrowed due to compression from the stomach and related to the elevated left hemidiaphragm. Fluid and gas filled dilated loops of small bowel throughout the abdomen. Small bowel loops measure 3.5 cm. The left side of the colon contains a large amount of stool but difficult to follow the course of the large bowel. Difficult to identify the cecum or terminal ileum based on the dilated loops of bowel. These findings are concerning for a bowel obstruction but the transition point is unclear. Vascular/Lymphatic: There is no significant lymphadenopathy. There is an infrarenal abdominal aortic aneurysm with calcified walls. The aneurysm measures up to 3.9 cm on sequence 2, image 43. Reproductive: Uterus is present without gross abnormality. Limited evaluation of the adnexal tissue. Other: No evidence for free fluid.  Musculoskeletal: Dextroscoliosis in the lumbar spine with multilevel facet disease. Grade 1 anterolisthesis at L4-L5 appears to secondary to facet disease. There is severe disc space loss at L2-L3 and L3-L4. IMPRESSION: Dilating loops of bowel, predominantly small bowel. Findings are suggestive for a bowel obstruction. Unclear  if the obstruction is involving the small bowel or the colon. Limited evaluation of the right colon and transverse colon due to the lack of IV contrast and multiple dilated small bowel loops. Abdominal aortic aneurysm measuring up to 3.9 cm. No evidence to suggest an aortic rupture or complication. Indeterminate hypodense and hyperdense structures involving the kidneys. These probably represents cyst but indeterminate. This may be better characterized with an ultrasound. Multilevel degenerative disease in the lumbar spine. Electronically Signed   By: Markus Daft M.D.   On: 12/14/2014 15:59   Dg Chest Portable 1 View  12/14/2014  CLINICAL DATA:  Hypotension.  Abdominal pain.  Symptoms today. EXAM: PORTABLE CHEST 1 VIEW COMPARISON:  Single view of the chest 11/05/2013. FINDINGS: Marked asymmetric elevation of the left hemidiaphragm relative to the right is unchanged. The lungs are clear. Heart size is enlarged. No pneumothorax or pleural effusion. IMPRESSION: Cardiomegaly without acute disease. Electronically Signed   By: Inge Rise M.D.   On: 12/14/2014 15:01     Assessment/Plan   1. Small bowel obstruction. Nothing by mouth. IV fluids. We'll hold off on IV fluids for the time being. 2. Atrial fibrillation. This is chronic and she is on anti-cognition therapy. Continue with IV heparin as she is nothing by mouth. 3. Hypertension. Currently patient is hypotensive. 4. Hypotension. This is a combination of hypovolemia/dehydration. She'll be given fluid resuscitation. Monitor blood pressure closely.  She'll be admitted to the stepdown unit as her blood pressure is low.   Code  Status: Full code.   DVT Prophylaxis: IV heparin.  Family Communication: I discussed the plan with the patient's husband at the bedside.   Disposition Plan: Home when medically stable.   Time spent: 60 minutes.  Doree Albee [facility] Pager [phone].

## 2014-12-14 NOTE — ED Notes (Signed)
Pt is difficult to assess as she is alert but not oriented to time or place.  Denies vomiting with one question but c/o vomiting a few minutes later.  Pt is alone in room at this time.

## 2014-12-14 NOTE — ED Provider Notes (Signed)
D/w dr Anastasio Champion Pt stable at this time to go to [place] Curet, MD 12/14/14 1640

## 2014-12-14 NOTE — ED Provider Notes (Signed)
Plan at signout f/u on imaging/labs and probable admit Labs/EKG pending at this time  Ripley Fraise, MD 12/14/14 1544

## 2014-12-14 NOTE — ED Provider Notes (Signed)
ED ECG REPORT   Date: 12/14/2014 1422  Rate: 74  Rhythm: atrial fibrillation  QRS Axis: normal  Intervals: normal  ST/T Wave abnormalities: nonspecific ST changes  Conduction Disutrbances:none  Narrative Interpretation: LVH noted  Old EKG Reviewed: unchanged  I have personally reviewed the EKG tracing and agree with the computerized printout as noted.   Pt with bowel obstruction Pt stable at this time Will admit D/w dr Elta Gu[place] Arnoldo Morale, surgery aware of patient   Ripley Fraise, MD 12/14/14 [phone]

## 2014-12-14 NOTE — ED Provider Notes (Signed)
CSN: [id number]     Arrival date & time 12/14/14  1400 History   First MD Initiated Contact with Patient 12/14/14 1409     Chief Complaint  Patient presents with  . Nausea  . Emesis    times 4 in last 24 hours.  . Constipation  . Hypotension     (Consider location/radiation/quality/duration/timing/severity/associated sxs/prior Treatment) HPI Comments: 79 year old female with history of atrial fibrillation, anticoagulation, urine infection, high blood pressure, tobacco abuse, multiple medicine allergies, cardiomyopathy ejection fraction 45% presents with hypotension nausea vomiting and transient abdominal pain. Starting last night patient had nausea and vomiting generally didn't feel well with low-grade fever and chills. This morning symptoms worsen had episode of abdominal pain that result. Mild hernia sensation.  Abdominal pain was ache centrally around the umbilicus. Nonradiating. No current antibiotics. No abdominal aneurysm history known. No active bleeding  Patient is a 79 y.o. female presenting with vomiting and constipation. The history is provided by the patient, the spouse and medical records.  Emesis Associated symptoms: abdominal pain   Associated symptoms: no chills and no headaches   Constipation Associated symptoms: abdominal pain and vomiting   Associated symptoms: no back pain, no dysuria and no fever     Past Medical History  Diagnosis Date  . Hypertension   . Stroke ([place])   . Acid reflux   . Atrial fibrillation ([facility])     tachybradycardia syndrome  . Tobacco abuse 04/30/2011  . Cardiomyopathy 05/02/2011    EF 45-50%, per Echo.  . 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 Carotid artery stenosis 05/02/2011  . CAD (coronary artery disease) 07/20/2006    R/S MV - mild perfusion defect in apical region consistent w/ infarct/scar vs apical thinning; no scintigraphic evidence of inducible myocardial ischemia; baseline EKG demonstrates A Flutter w/ variable AV  conduction  . Hyperlipidemia   . Chest pain   . Cerebral atherosclerosis 12/11/2011    Carotid duplex - R ICA 0-49% diameter reduction, velocities suggest high end; L ICA 0-49% reduction, velocities suggest mid range  . Neuropathy ([place])   . Syncope   . Chronic anticoagulation 11/11/2013  . Orthostatic hypotension 11/11/2013  . Cancer [facility])     facial   Past Surgical History  Procedure Laterality Date  . Cholecystectomy    . Skin cancer excision    . Tonsillectomy    . Tee with cardioversion      failed in past but dates not given   Family History  Problem Relation Age of Onset  . Adopted: Yes   Social History  Substance Use Topics  . Smoking status: Current Every Day Smoker -- 0.50 packs/day for 40 years    Types: Cigarettes  . Smokeless tobacco: Never Used  . Alcohol Use: No   OB History    No data available     Review of Systems  Constitutional: Positive for appetite change and fatigue. Negative for fever and chills.  HENT: Negative for congestion.   Eyes: Negative for visual disturbance.  Respiratory: Negative for shortness of breath.   Cardiovascular: Negative for chest pain.  Gastrointestinal: Positive for vomiting, abdominal pain and constipation.  Genitourinary: Negative for dysuria and flank pain.  Musculoskeletal: Positive for gait problem (chronic weakness). Negative for back pain, neck pain and neck stiffness.  Skin: Negative for rash.  Neurological: Positive for light-headedness. Negative for headaches.      Allergies  Ciprofloxacin; Codeine; Contrast media; Iodine; Lovenox; Nitrofurantoin; Nitrofurantoin monohyd macro; Penicillins; Sulfa antibiotics;  and Sulfonamide derivatives  Home Medications   Prior to Admission medications   Medication Sig Start Date End Date Taking? Authorizing Provider  atorvastatin (LIPITOR) 10 MG tablet take 1 tablet by mouth at bedtime 05/21/14   Lorretta Harp, MD  CALCIUM-VITAMIN D PO Take 1 tablet by mouth daily.     Historical Provider, MD  clotrimazole-betamethasone (LOTRISONE) cream Apply 1 application topically daily as needed (for irritation).  02/10/14   Historical Provider, MD  docusate sodium 100 MG CAPS Take 100 mg by mouth 2 (two) times daily. Patient taking differently: Take 100 mg by mouth daily as needed (for constipation).  11/11/13   Isaiah Serge, NP  esomeprazole (NEXIUM) 20 MG capsule Take 20 mg by mouth daily before breakfast.    Historical Provider, MD  estazolam (PROSOM) 2 MG tablet Take 2 mg by mouth at bedtime.    Historical Provider, MD  metoprolol tartrate (LOPRESSOR) 25 MG tablet Take 12.5 mg by mouth daily.    Historical Provider, MD  MYRBETRIQ 25 MG TB24 Take 25 mg by mouth daily. 06/27/12   Historical Provider, MD  niacin 500 MG tablet Take 500 mg by mouth daily.    Historical Provider, MD  NITROSTAT 0.4 MG SL tablet Place 1 tablet (0.4 mg total) under the tongue every 5 (five) minutes as needed for chest pain. 05/13/14   Lorretta Harp, MD  oxybutynin (DITROPAN-XL) 10 MG 24 hr tablet Take 10 mg by mouth every morning.     Historical Provider, MD  polyethylene glycol (MIRALAX / GLYCOLAX) packet Take 17 g by mouth daily. Patient taking differently: Take 17 g by mouth daily as needed.  11/11/13   Isaiah Serge, NP  Potassium 99 MG TABS Take 1 tablet by mouth daily.    Historical Provider, MD  prazosin (MINIPRESS) 2 MG capsule Take 2 mg by mouth 2 (two) times daily.     Historical Provider, MD  sertraline (ZOLOFT) 50 MG tablet Take 50 mg by mouth daily. 11/17/14   Historical Provider, MD  traMADol-acetaminophen (ULTRACET) 37.5-325 MG per tablet Take 37.5 tablets by mouth daily as needed for moderate pain or severe pain (*May take two tablets three times daily for hip pain).  11/03/13   Historical Provider, MD  triamcinolone cream (KENALOG) 0.1 % Apply 1 application topically daily as needed (for itching/irritation).  02/26/14   Historical Provider, MD  trimethoprim (TRIMPEX) 100 MG tablet  Take 100 mg by mouth daily.  01/07/13   Historical Provider, MD  warfarin (COUMADIN) 3 MG tablet Dr Gerarda Fraction follows; alternates 3mg  with 4mg  02/23/14   Historical Provider, MD  warfarin (COUMADIN) 4 MG tablet Take 1 tablet (4 mg total) by mouth every evening. Holding for 2 days then recheck at PCP 11/13/13   Isaiah Serge, NP   BP 99/56 mmHg  Pulse 79  Temp(Src) 98.2 F (36.8 C) (Oral)  Resp 13  Ht 5\' 2"  (1.575 m)  Wt 105 lb (47.628 kg)  BMI 19.20 kg/m2  SpO2 97% Physical Exam  Constitutional: She is oriented to person, place, and time. She appears well-developed and well-nourished.  HENT:  Head: Normocephalic and atraumatic.  Dry mucous membranes  Eyes: Right eye exhibits no discharge. Left eye exhibits no discharge.  Neck: Normal range of motion. Neck supple. No tracheal deviation present.  Cardiovascular: Normal rate and regular rhythm.   Pulmonary/Chest: Effort normal and breath sounds normal.  Abdominal: Soft. She exhibits no distension. There is no tenderness (2.5 cm hernia palpated  easily reducible, no discoloration surrounding skin.). There is no guarding.  Musculoskeletal: She exhibits no edema.  Neurological: She is alert and oriented to person, place, and time.  Patient moves all extremities grossly equal, no focal deficit, general weakness  Skin: Skin is warm. No rash noted.  Significant ecchymosis anterior chest and upper arms bilateral, recent fall.  Psychiatric: She has a normal mood and affect.  Nursing note and vitals reviewed.   ED Course  Procedures (including critical care time) CRITICAL CARE Performed by: Mariea Clonts   Total critical care time: 35 min  Critical care time was exclusive of separately billable procedures and treating other patients.  Critical care was necessary to treat or prevent imminent or life-threatening deterioration.  Critical care was time spent personally by me on the following activities: development of treatment plan with  patient and/or surrogate as well as nursing, discussions with consultants, evaluation of patient's response to treatment, examination of patient, obtaining history from patient or surrogate, ordering and performing treatments and interventions, ordering and review of laboratory studies, ordering and review of radiographic studies, pulse oximetry and re-evaluation of patient's condition.  EMERGENCY DEPARTMENT ULTRASOUND  Study: Limited Retroperitoneal Ultrasound of the Abdominal Aorta.  INDICATIONS:Hypotension, Abdominal pain and Age>55 Multiple views of the abdominal aorta were obtained in real-time from the diaphragmatic hiatus to the aortic bifurcation in transverse planes with a multi-frequency probe. PERFORMED BY: Myself IMAGES ARCHIVED?: Yes FINDINGS: Maximum aortic dimensions are 3.7 cm LIMITATIONS:  Bowel gas INTERPRETATION:  Abdominal aortic aneurysm present   CPT Code: [id number] (limited retroperitoneal)    EMERGENCY DEPARTMENT [place] CARDIAC EXAM "Study: Limited Ultrasound of the heart and pericardium"  INDICATIONS:Hypotension Multiple views of the heart and pericardium were obtained in real-time with a multi-frequency probe.  PERFORMED TS:[id number]  IMAGES ARCHIVED?: Yes  FINDINGS: No pericardial effusion and Decreased contractility  LIMITATIONS:  Body habitus only curvilinear probe  VIEWS USED: Subcostal 4 chamber  INTERPRETATION: Cardiac activity present, Pericardial effusioin absent and Cardiac tamponade absent  CPT Code: 03009-23 (limited transthoracic cardiac)  Hernia reduction Small proximally 3 cm hernia anterior/ventral abdominal wall, no surrounding erythema or ecchymosis. With exhalation and gentle pressure easily reduced Performed by myself Labs Review Labs Reviewed  CBC WITH DIFFERENTIAL/PLATELET - Abnormal; Notable for the following:    Hemoglobin 15.3 (*)    All other components within normal limits  I-STAT CHEM 8, ED - Abnormal; Notable for the  following:    Sodium 133 (*)    Chloride 93 (*)    BUN 25 (*)    Creatinine, Ser 1.50 (*)    Glucose, Bld 116 (*)    Hemoglobin 16.3 (*)    HCT 48.0 (*)    All other components within normal limits  URINE CULTURE  CULTURE, BLOOD (ROUTINE X 2)  CULTURE, BLOOD (ROUTINE X 2)  COMPREHENSIVE METABOLIC PANEL  TROPONIN I  URINALYSIS, ROUTINE W REFLEX MICROSCOPIC (NOT AT [facility])  PROTIME-INR  I-STAT CG4 LACTIC ACID, ED    Imaging Review No results found. I have personally reviewed and evaluated these images and lab results as part of my medical decision-making.   EKG Interpretation None     EKG reviewed irregular, nonspecific ST changes, anterior Q waves, normal QT, normal heart rate MDM   Final diagnoses:  Periumbilical abdominal pain  Transient hypotension  Non-intractable vomiting with nausea, vomiting of unspecified type  Abdominal aneurysm [facility])   Patient presents with hypotension, discussed differential diagnosis with patient. Bedside ultrasound revealed abdominal aortic aneurysm which  is new according to patient's recollection. No significant pericardial effusion seen. Fluid bolus ordered. Broad workup including septic workup cardiac workup and CT without contrast due to kidney function and contrast allergy. Likely plan for admission. Clinically dehydrated/ weak.  Signed out to fup CT results. The patients results and plan were reviewed and discussed.   Any x-rays performed were independently reviewed by myself.   Differential diagnosis were considered with the presenting HPI.  Medications  sodium chloride 0.9 % bolus 500 mL (500 mLs Intravenous New Bag/Given 12/14/14 1442)    Filed Vitals:   12/14/14 1403 12/14/14 1430  BP: 73/34 99/56  Pulse: 79 79  Temp: 98.2 F (36.8 C)   TempSrc: Oral   Resp: 16 13  Height: 5\' 2"  (1.575 m)   Weight: 105 lb (47.628 kg)   SpO2: 98% 97%    Final diagnoses:  Periumbilical abdominal pain  Transient hypotension   Non-intractable vomiting with nausea, vomiting of unspecified type  Abdominal aneurysm (HCC)  Acute renal failure, unspecified acute renal failure type ([place])  SBO (small bowel obstruction) (HCC)       Elnora Morrison, MD 12/18/14 1558

## 2014-12-14 NOTE — ED Notes (Signed)
Having n/v since MN.  Vomited about 4 times in last 24 hours.  Denies diarrhea.

## 2014-12-14 NOTE — Progress Notes (Signed)
ANTICOAGULATION CONSULT NOTE - Initial Consult  Pharmacy Consult for Heparin Indication: atrial fibrillation  Allergies  Allergen Reactions  . Ciprofloxacin Anaphylaxis and Rash  . Codeine   . Contrast Media [Iodinated Diagnostic Agents]     Per pt chart.  . Iodine   . Lovenox [Enoxaparin] Other (See Comments)    Unknown reaction  . Nitrofurantoin Other (See Comments)    Numbness in arms and legs  . Nitrofurantoin Monohyd Macro Other (See Comments)  . Penicillins Rash  . Sulfa Antibiotics Rash  . Sulfonamide Derivatives Rash    Patient Measurements: Height: 5\' 2"  (157.5 cm) Weight: 105 lb (47.628 kg) IBW/kg (Calculated) : 50.1 Heparin Dosing Weight: 47.6Kg  Vital Signs: Temp: 99 F (37.2 C) (10/24 1608) Temp Source: Rectal (10/24 1608) BP: 121/68 mmHg (10/24 1530) Pulse Rate: 71 (10/24 1500)  Labs:  Recent Labs  12/14/14 1438 12/14/14 1445 12/14/14 1530  HGB 15.3* 16.3*  --   HCT 43.9 48.0*  --   PLT 234  --   --   LABPROT  --   --  31.2*  INR  --   --  3.08*  CREATININE 1.42* 1.50*  --   TROPONINI 0.04*  --   --     Estimated Creatinine Clearance: 23.6 mL/min (by C-G formula based on Cr of 1.5).   Medical History: Past Medical History  Diagnosis Date  . Hypertension   . Stroke ([place])   . Acid reflux   . Atrial fibrillation (H[facility])     tachybradycardia syndrome  . Tobacco abuse 04/30/2011  . Cardiomyopathy 05/02/2011    EF 45-50%, per Echo.  . 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 Carotid artery stenosis 05/02/2011  . CAD (coronary artery disease) 07/20/2006    R/S MV - mild perfusion defect in apical region consistent w/ infarct/scar vs apical thinning; no scintigraphic evidence of inducible myocardial ischemia; baseline EKG demonstrates A Flutter w/ variable AV conduction  . Hyperlipidemia   . Chest pain   . Cerebral atherosclerosis 12/11/2011    Carotid duplex - R ICA 0-49% diameter reduction, velocities suggest high end; L ICA 0-49%  reduction, velocities suggest mid range  . Neuropathy ([place])   . Syncope   . Chronic anticoagulation 11/11/2013  . Orthostatic hypotension 11/11/2013  . Cancer [facility])     facial   Assessment: 79yo female with h/o stroke and afib.  On Chronic Coumadin at home. Pt is currently anticoagulated with INR > 3 (SUPRAtherapeutic).  Will begin Heparin when INR < 2.  CBC is OK.  Goal of Therapy:  Heparin level 0.3-0.7 units/ml Monitor platelets by anticoagulation protocol: Yes   Plan:  HOLD Heparin for now Check INR daily, begin Heparin when INR < 2  [place] Crane, Audrianna Driskill A 12/14/2014,5:16 PM

## 2014-12-15 DIAGNOSIS — I481 Persistent atrial fibrillation: Secondary | ICD-10-CM

## 2014-12-15 DIAGNOSIS — I9589 Other hypotension: Secondary | ICD-10-CM

## 2014-12-15 LAB — PROTIME-INR
INR: 4.05 — ABNORMAL HIGH (ref 0.00–1.49)
Prothrombin Time: 38.4 seconds — ABNORMAL HIGH (ref 11.6–15.2)

## 2014-12-15 LAB — CBC
HEMATOCRIT: 40.3 % (ref 36.0–46.0)
HEMOGLOBIN: 13.3 g/dL (ref 12.0–15.0)
MCH: 30.4 pg (ref 26.0–34.0)
MCHC: 33 g/dL (ref 30.0–36.0)
MCV: 92.2 fL (ref 78.0–100.0)
Platelets: 216 10*3/uL (ref 150–400)
RBC: 4.37 MIL/uL (ref 3.87–5.11)
RDW: 14 % (ref 11.5–15.5)
WBC: 7.5 10*3/uL (ref 4.0–10.5)

## 2014-12-15 LAB — COMPREHENSIVE METABOLIC PANEL
ALBUMIN: 3.5 g/dL (ref 3.5–5.0)
ALT: 16 U/L (ref 14–54)
ANION GAP: 8 (ref 5–15)
AST: 25 U/L (ref 15–41)
Alkaline Phosphatase: 63 U/L (ref 38–126)
BUN: 23 mg/dL — ABNORMAL HIGH (ref 6–20)
CHLORIDE: 107 mmol/L (ref 101–111)
CO2: 21 mmol/L — AB (ref 22–32)
Calcium: 8.6 mg/dL — ABNORMAL LOW (ref 8.9–10.3)
Creatinine, Ser: 0.76 mg/dL (ref 0.44–1.00)
GFR calc Af Amer: >60 mL/min (ref >60)
GFR calc non Af Amer: >60 mL/min (ref >60)
GLUCOSE: 81 mg/dL (ref 65–99)
POTASSIUM: 3.5 mmol/L (ref 3.5–5.1)
SODIUM: 136 mmol/L (ref 135–145)
Total Bilirubin: 1.1 mg/dL (ref 0.3–1.2)
Total Protein: 6.2 g/dL — ABNORMAL LOW (ref 6.5–8.1)

## 2014-12-15 NOTE — Consult Note (Signed)
Reason for Consult:small bowel obstruction Referring Physician: hospitalists  Andrea [name] is an 79 y.o. female.  HPI: patient is a 79 year old white female multiple medical problems who presented to [facility] with emesis. CT scan of the abdomen revealed a short segment of a partial small bowel obstruction with possible thickened bowel wall. Patient appears to be a poor historian. Her INR was greater than 3. She was placed in the ICU for further monitoring. No NG tube was placed. Overnight, she had 3 bowel movements. Her INR has increased to greater than 4.  Past Medical History  Diagnosis Date  . Hypertension   . Stroke ([place])   . Acid reflux   . Atrial fibrillation (HCC)     tachybradycardia syndrome  . Tobacco abuse 04/30/2011  . Cardiomyopathy 05/02/2011    EF 45-50%, per Echo.  . 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 Carotid artery stenosis 05/02/2011  . CAD (coronary artery disease) 07/20/2006    R/S MV - mild perfusion defect in apical region consistent w/ infarct/scar vs apical thinning; no scintigraphic evidence of inducible myocardial ischemia; baseline EKG demonstrates A Flutter w/ variable AV conduction  . Hyperlipidemia   . Chest pain   . Cerebral atherosclerosis 12/11/2011    Carotid duplex - R ICA 0-49% diameter reduction, velocities suggest high end; L ICA 0-49% reduction, velocities suggest mid range  . Neuropathy ([place])   . Syncope   . Chronic anticoagulation 11/11/2013  . Orthostatic hypotension 11/11/2013  . Cancer [facility])     facial    Past Surgical History  Procedure Laterality Date  . Cholecystectomy    . Skin cancer excision    . Tonsillectomy    . Tee with cardioversion      failed in past but dates not given    Family History  Problem Relation Age of Onset  . Adopted: Yes    Social History:  reports that she has been smoking Cigarettes.  She has a 20 pack-year smoking history. She has never used smokeless tobacco. She reports that  she does not drink alcohol or use illicit drugs.  Allergies:  Allergies  Allergen Reactions  . Ciprofloxacin Anaphylaxis and Rash  . Codeine   . Contrast Media [Iodinated Diagnostic Agents]     Per pt chart.  . Iodine   . Lovenox [Enoxaparin] Other (See Comments)    Unknown reaction  . Nitrofurantoin Other (See Comments)    Numbness in arms and legs  . Nitrofurantoin Monohyd Macro Other (See Comments)  . Penicillins Rash  . Sulfa Antibiotics Rash  . Sulfonamide Derivatives Rash    Medications: I have reviewed the patient's current medications.  Results for orders placed or performed during the hospital encounter of 12/14/14 (from the past 48 hour(s))  CBC with Differential/Platelet     Status: Abnormal   Collection Time: 12/14/14  2:38 PM  Result Value Ref Range   WBC 9.1 4.0 - 10.5 K/uL   RBC 4.92 3.87 - 5.11 MIL/uL   Hemoglobin 15.3 (H) 12.0 - 15.0 g/dL   HCT 43.9 36.0 - 46.0 %   MCV 89.2 78.0 - 100.0 fL   MCH 31.1 26.0 - 34.0 pg   MCHC 34.9 30.0 - 36.0 g/dL   RDW 13.7 11.5 - 15.5 %   Platelets 234 150 - 400 K/uL   Neutrophils Relative % 82 %   Neutro Abs 7.5 1.7 - 7.7 K/uL   Lymphocytes Relative 11 %   Lymphs Abs  1.0 0.7 - 4.0 K/uL   Monocytes Relative 6 %   Monocytes Absolute 0.5 0.1 - 1.0 K/uL   Eosinophils Relative 1 %   Eosinophils Absolute 0.1 0.0 - 0.7 K/uL   Basophils Relative 0 %   Basophils Absolute 0.0 0.0 - 0.1 K/uL  Comprehensive metabolic panel     Status: Abnormal   Collection Time: 12/14/14  2:38 PM  Result Value Ref Range   Sodium 133 (L) 135 - 145 mmol/L   Potassium 3.9 3.5 - 5.1 mmol/L   Chloride 92 (L) 101 - 111 mmol/L   CO2 27 22 - 32 mmol/L   Glucose, Bld 115 (H) 65 - 99 mg/dL   BUN 27 (H) 6 - 20 mg/dL   Creatinine, Ser 1.42 (H) 0.44 - 1.00 mg/dL   Calcium 10.2 8.9 - 10.3 mg/dL   Total Protein 7.9 6.5 - 8.1 g/dL   Albumin 4.4 3.5 - 5.0 g/dL   AST 29 15 - 41 U/L   ALT 20 14 - 54 U/L   Alkaline Phosphatase 81 38 - 126 U/L   Total  Bilirubin 1.3 (H) 0.3 - 1.2 mg/dL   GFR calc non Af Amer 35 (L) >60 mL/min   GFR calc Af Amer 40 (L) >60 mL/min    Comment: (NOTE) The eGFR has been calculated using the CKD EPI equation. This calculation has not been validated in all clinical situations. eGFR's persistently <60 mL/min signify possible Chronic Kidney Disease.    Anion gap 14 5 - 15  Troponin I     Status: Abnormal   Collection Time: 12/14/14  2:38 PM  Result Value Ref Range   Troponin I 0.04 (H) <0.031 ng/mL    Comment:        PERSISTENTLY INCREASED TROPONIN VALUES IN THE RANGE OF 0.04-0.49 ng/mL CAN BE SEEN IN:       -UNSTABLE ANGINA       -CONGESTIVE HEART FAILURE       -MYOCARDITIS       -CHEST TRAUMA       -ARRYHTHMIAS       -LATE PRESENTING MYOCARDIAL INFARCTION       -COPD   CLINICAL FOLLOW-UP RECOMMENDED.   I-stat chem 8, ed     Status: Abnormal   Collection Time: 12/14/14  2:45 PM  Result Value Ref Range   Sodium 133 (L) 135 - 145 mmol/L   Potassium 3.8 3.5 - 5.1 mmol/L   Chloride 93 (L) 101 - 111 mmol/L   BUN 25 (H) 6 - 20 mg/dL   Creatinine, Ser 1.50 (H) 0.44 - 1.00 mg/dL   Glucose, Bld 116 (H) 65 - 99 mg/dL   Calcium, Ion 1.13 1.13 - 1.30 mmol/L   TCO2 25 0 - 100 mmol/L   Hemoglobin 16.3 (H) 12.0 - 15.0 g/dL   HCT 48.0 (H) 36.0 - 46.0 %  I-Stat CG4 Lactic Acid, ED     Status: None   Collection Time: 12/14/14  2:51 PM  Result Value Ref Range   Lactic Acid, Venous 1.46 0.5 - 2.0 mmol/L  Protime-INR     Status: Abnormal   Collection Time: 12/14/14  3:30 PM  Result Value Ref Range   Prothrombin Time 31.2 (H) 11.6 - 15.2 seconds   INR 3.08 (H) 0.00 - 1.49  Urinalysis, Routine w reflex microscopic (not at [facility])     Status: Abnormal   Collection Time: 12/14/14  4:00 PM  Result Value Ref Range   Color, Urine YELLOW YELLOW  APPearance CLEAR CLEAR   Specific Gravity, Urine 1.015 1.005 - 1.030   pH 7.0 5.0 - 8.0   Glucose, UA NEGATIVE NEGATIVE mg/dL   Hgb urine dipstick NEGATIVE NEGATIVE    Bilirubin Urine SMALL (A) NEGATIVE   Ketones, ur TRACE (A) NEGATIVE mg/dL   Protein, ur TRACE (A) NEGATIVE mg/dL   Urobilinogen, UA 1.0 0.0 - 1.0 mg/dL   Nitrite NEGATIVE NEGATIVE   Leukocytes, UA TRACE (A) NEGATIVE  Urine microscopic-add on     Status: Abnormal   Collection Time: 12/14/14  4:00 PM  Result Value Ref Range   Squamous Epithelial / LPF RARE RARE   WBC, UA 3-6 <3 WBC/hpf   Bacteria, UA MANY (A) RARE  Urine culture     Status: None (Preliminary result)   Collection Time: 12/14/14  4:03 PM  Result Value Ref Range   Specimen Description URINE, CATHETERIZED    Special Requests NONE    Culture      TOO YOUNG TO READ Performed at [facility]    Report Status PENDING   MRSA PCR Screening     Status: None   Collection Time: 12/14/14  5:30 PM  Result Value Ref Range   MRSA by PCR NEGATIVE NEGATIVE    Comment:        The GeneXpert MRSA Assay (FDA approved for NASAL specimens only), is one component of a comprehensive MRSA colonization surveillance program. It is not intended to diagnose MRSA infection nor to guide or monitor treatment for MRSA infections.   Comprehensive metabolic panel     Status: Abnormal   Collection Time: 12/15/14  4:10 AM  Result Value Ref Range   Sodium 136 135 - 145 mmol/L   Potassium 3.5 3.5 - 5.1 mmol/L   Chloride 107 101 - 111 mmol/L   CO2 21 (L) 22 - 32 mmol/L   Glucose, Bld 81 65 - 99 mg/dL   BUN 23 (H) 6 - 20 mg/dL   Creatinine, Ser 0.76 0.44 - 1.00 mg/dL   Calcium 8.6 (L) 8.9 - 10.3 mg/dL   Total Protein 6.2 (L) 6.5 - 8.1 g/dL   Albumin 3.5 3.5 - 5.0 g/dL   AST 25 15 - 41 U/L   ALT 16 14 - 54 U/L   Alkaline Phosphatase 63 38 - 126 U/L   Total Bilirubin 1.1 0.3 - 1.2 mg/dL   GFR calc non Af Amer >60 >60 mL/min   GFR calc Af Amer >60 >60 mL/min    Comment: (NOTE) The eGFR has been calculated using the CKD EPI equation. This calculation has not been validated in all clinical situations. eGFR's persistently <60 mL/min  signify possible Chronic Kidney Disease.    Anion gap 8 5 - 15  CBC     Status: None   Collection Time: 12/15/14  4:10 AM  Result Value Ref Range   WBC 7.5 4.0 - 10.5 K/uL   RBC 4.37 3.87 - 5.11 MIL/uL   Hemoglobin 13.3 12.0 - 15.0 g/dL   HCT 40.3 36.0 - 46.0 %   MCV 92.2 78.0 - 100.0 fL   MCH 30.4 26.0 - 34.0 pg   MCHC 33.0 30.0 - 36.0 g/dL   RDW 14.0 11.5 - 15.5 %   Platelets 216 150 - 400 K/uL  Protime-INR     Status: Abnormal   Collection Time: 12/15/14  4:10 AM  Result Value Ref Range   Prothrombin Time 38.4 (H) 11.6 - 15.2 seconds   INR 4.05 (H) 0.00 -  1.49    Ct Abdomen Pelvis Wo Contrast  12/14/2014  CLINICAL DATA:  79 year old with hypotension. Abdominal pain and vomiting. EXAM: CT ABDOMEN AND PELVIS WITHOUT CONTRAST TECHNIQUE: Multidetector CT imaging of the abdomen and pelvis was performed following the standard protocol without IV contrast. COMPARISON:  None. FINDINGS: Lower chest: Volume loss in the left lung related to elevated left hemidiaphragm. Hepatobiliary: No gross abnormality to the liver on this noncontrast examination. Difficult to know if the patient has a gallbladder due to fluid-filled loops of bowel in the right upper abdomen. Pancreas: No gross abnormality to pancreas on this noncontrast examination. Spleen: Normal appearance of the spleen. Adrenals/Urinary Tract: Mild fullness of the right adrenal gland. No gross abnormality to the left adrenal gland. High-density structures along the medial cortex of left kidney, with Hounsfield units of 57. These could represent proteinaceous or hyperdense cysts but indeterminate. There is probably a low-density cyst along the left kidney lower pole but this is poorly characterized. Question a small cyst in the right kidney upper pole. No significant hydronephrosis in either kidney. There is fluid in the urinary bladder without gross abnormality. Stomach/Bowel: Marked elevation of the left hemidiaphragm. Distal stomach may be  narrowed due to compression from the stomach and related to the elevated left hemidiaphragm. Fluid and gas filled dilated loops of small bowel throughout the abdomen. Small bowel loops measure 3.5 cm. The left side of the colon contains a large amount of stool but difficult to follow the course of the large bowel. Difficult to identify the cecum or terminal ileum based on the dilated loops of bowel. These findings are concerning for a bowel obstruction but the transition point is unclear. Vascular/Lymphatic: There is no significant lymphadenopathy. There is an infrarenal abdominal aortic aneurysm with calcified walls. The aneurysm measures up to 3.9 cm on sequence 2, image 43. Reproductive: Uterus is present without gross abnormality. Limited evaluation of the adnexal tissue. Other: No evidence for free fluid. Musculoskeletal: Dextroscoliosis in the lumbar spine with multilevel facet disease. Grade 1 anterolisthesis at L4-L5 appears to secondary to facet disease. There is severe disc space loss at L2-L3 and L3-L4. IMPRESSION: Dilating loops of bowel, predominantly small bowel. Findings are suggestive for a bowel obstruction. Unclear if the obstruction is involving the small bowel or the colon. Limited evaluation of the right colon and transverse colon due to the lack of IV contrast and multiple dilated small bowel loops. Abdominal aortic aneurysm measuring up to 3.9 cm. No evidence to suggest an aortic rupture or complication. Indeterminate hypodense and hyperdense structures involving the kidneys. These probably represents cyst but indeterminate. This may be better characterized with an ultrasound. Multilevel degenerative disease in the lumbar spine. Electronically Signed   By: Markus Daft M.D.   On: 12/14/2014 15:59   Dg Chest Portable 1 View  12/14/2014  CLINICAL DATA:  Hypotension.  Abdominal pain.  Symptoms today. EXAM: PORTABLE CHEST 1 VIEW COMPARISON:  Single view of the chest 11/05/2013. FINDINGS: Marked  asymmetric elevation of the left hemidiaphragm relative to the right is unchanged. The lungs are clear. Heart size is enlarged. No pneumothorax or pleural effusion. IMPRESSION: Cardiomegaly without acute disease. Electronically Signed   By: Inge Rise M.D.   On: 12/14/2014 15:01    ROS: see chart  Blood pressure 138/69, pulse 92, temperature 98.7 F (37.1 C), temperature source Oral, resp. rate 16, height 5' 2"  (1.575 m), weight 46.6 kg (102 lb 11.8 oz), SpO2 97 %. Physical Exam: cachectic white female in  no acute distress. Abdomen soft, nontender, nondistended. Active bowel sounds appreciated.No hepatosplenomegaly, masses, or hernias identified. Rectal examination was deferred at this time.  Assessment/Plan: Partial small bowel obstruction, resolving. Doubt ischemia of short segment of bowel this time. Obviously no need for surgical intervention. Would advance diet as patient can tolerate. Will follow with you.  Gregrey Bloyd A 12/15/2014, 8:54 AM

## 2014-12-15 NOTE — Care Management Note (Signed)
Case Management Note  Patient Details  Name: Andrea [name] MRN: [id number] Date of Birth: 14-Mar-1935  Subjective/Objective:                  Pt is from home, lives with husband. Pt uses a wheelchair at baseline and requires assistance with ADL's. Pt has no HH services prior to admission but has used [facility] in the past. Pt has h/o CVA of which she still has deficits. Pt has no DME needs prior to admission.   Action/Plan: PT eval recommendation is pending. Pt plans to return home with self care at this time. Will cont to follow.   Expected Discharge Date:     12/19/2014             Expected Discharge Plan:  Home/Self Care  In-House Referral:  NA  Discharge planning Services  CM Consult  Post Acute Care Choice:  NA Choice offered to:  NA  DME Arranged:    DME Agency:     HH Arranged:    HH Agency:     Status of Service:  In process, will continue to follow  Medicare Important Message Given:    Date Medicare IM Given:    Medicare IM give by:    Date Additional Medicare IM Given:    Additional Medicare Important Message give by:     If discussed at [place] of Stay Meetings, dates discussed:    Additional Comments:  Sherald Barge, RN 12/15/2014, 3:28 PM

## 2014-12-15 NOTE — Evaluation (Signed)
Physical Therapy Evaluation Patient Details Name: Andrea [name] MRN: [id number] DOB: 1936-01-26 Today's Date: 12/15/2014   History of Present Illness  This is a 79 year old lady who has aphasia from previous stroke and presents now with vomiting, 4 times in last 24 hours associated with constipation. She has a history of hypertension but was found to be hypotensive in the emergency room. According to the husband, who is at the bedside, she also had abdominal pain around her umbilicus. She appeared to have an umbilical hernia on presentation to the emergency room, which was reduced. Her symptoms seem to improve after this but CT scan suggests small bowel obstruction. She is now being admitted for further management. There is no fever, diarrhea.  Her symptoms are now resolved.  Clinical Impression   Pt was seen for evaluation.  She was alert but very disoriented, could not remember her correct name.  This disorientation was upsetting to her and she appeared to be a bit agitated because of it.  She was unable to give any current history.  I saw her for evaluation 2 years ago and at that time she was minimally ambulatory and lived at home with family.  It is noted that she was seen in the ED on 12-04-14 for a fall in the bathroom.  She has multiple bruising over her body.  She is s/p stroke and has expressive aphasia although is understandable.  She is very frail with poor muscle integrity.  She has peripheral neuropathy in the hands and feet with no muscle function at the ankles.  Her LE strength is in the 2/5 range.  I tried to call her husband to get some functional history but he was not home.  We were not able to complete the eval as she and the bed were soiled with stool and she was needing a full bath/bed change.  We will complete the eval tomorrow and make any recommendations at that time.    Follow Up Recommendations  (none at this time...to be determined)    Equipment Recommendations  None  recommended by PT    Recommendations for Other Services   none    Precautions / Restrictions Precautions Precautions: Fall Restrictions Weight Bearing Restrictions: No      Mobility  Bed Mobility               General bed mobility comments: pt's bed was found to be completely soiled with stool...unable to progress to bed mobility  Transfers                 General transfer comment: unable to test  Ambulation/Gait                Stairs            Wheelchair Mobility    Modified Rankin (Stroke Patients Only)       Balance                                             Pertinent Vitals/Pain Pain Assessment: No/denies pain    Home Living Family/patient expects to be discharged to:: Private residence Living Arrangements: Spouse/significant other               Additional Comments: pt has no family present and she is not able to provide any of the above information which is current  Prior Function           Comments: unknown     Hand Dominance   Dominant Hand: Right    Extremity/Trunk Assessment               Lower Extremity Assessment: RLE deficits/detail;LLE deficits/detail RLE Deficits / Details: no strength at ankle, other musculature=2/5 at best LLE Deficits / Details: no strength at ankle, other musculature=2/5 at best     Communication   Communication: Expressive difficulties  Cognition Arousal/Alertness: Awake/alert Behavior During Therapy: WFL for tasks assessed/performed Overall Cognitive Status: History of cognitive impairments - at baseline                      General Comments      Exercises        Assessment/Plan    PT Assessment Patient needs continued PT services (to complete evaluation)  PT Diagnosis     PT Problem List Decreased strength  PT Treatment Interventions  (complete evaluation)   PT Goals (Current goals can be found in the Care Plan section) Acute  Rehab PT Goals Patient Stated Goal: none stated PT Goal Formulation: Patient unable to participate in goal setting Time For Goal Achievement: 12/22/14 Additional Goals Additional Goal #1: complete evaluation and make recommendations as needed    Frequency Min 1X/week   Barriers to discharge   unknown    Co-evaluation               End of Session   Activity Tolerance: Patient tolerated treatment well Patient left: in bed;with call bell/phone within reach;with bed alarm set           Time: 1254-1318 PT Time Calculation (min) (ACUTE ONLY): 24 min   Charges:   PT Evaluation $Initial PT Evaluation Tier I: 1 Procedure     PT G CodesSable Feil  PT 12/15/2014, 1:38 PM [phone]

## 2014-12-15 NOTE — Progress Notes (Signed)
[place] for Coumadin (chronic Rx PTA) Indication: atrial fibrillation  Allergies  Allergen Reactions  . Ciprofloxacin Anaphylaxis and Rash  . Codeine   . Contrast Media [Iodinated Diagnostic Agents]     Per pt chart.  . Iodine   . Lovenox [Enoxaparin] Other (See Comments)    Unknown reaction  . Nitrofurantoin Other (See Comments)    Numbness in arms and legs  . Nitrofurantoin Monohyd Macro Other (See Comments)  . Penicillins Rash  . Sulfa Antibiotics Rash  . Sulfonamide Derivatives Rash   Patient Measurements: Height: 5\' 2"  (157.5 cm) Weight: 102 lb 11.8 oz (46.6 kg) IBW/kg (Calculated) : 50.1  Vital Signs: Temp: 98.7 F (37.1 C) (10/25 0800) Temp Source: Oral (10/25 0800) BP: 138/69 mmHg (10/25 0630) Pulse Rate: 92 (10/25 0630)  Labs:  Recent Labs  12/14/14 1438 12/14/14 1445 12/14/14 1530 12/15/14 0410  HGB 15.3* 16.3*  --  13.3  HCT 43.9 48.0*  --  40.3  PLT 234  --   --  216  LABPROT  --   --  31.2* 38.4*  INR  --   --  3.08* 4.05*  CREATININE 1.42* 1.50*  --  0.76  TROPONINI 0.04*  --   --   --    Estimated Creatinine Clearance: 43.3 mL/min (by C-G formula based on Cr of 0.76).  Medical History: Past Medical History  Diagnosis Date  . Hypertension   . Stroke ([place])   . Acid reflux   . Atrial fibrillation (HCC)     tachybradycardia syndrome  . Tobacco abuse 04/30/2011  . Cardiomyopathy 05/02/2011    EF 45-50%, per Echo.  . 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 Carotid artery stenosis 05/02/2011  . CAD (coronary artery disease) 07/20/2006    R/S MV - mild perfusion defect in apical region consistent w/ infarct/scar vs apical thinning; no scintigraphic evidence of inducible myocardial ischemia; baseline EKG demonstrates A Flutter w/ variable AV conduction  . Hyperlipidemia   . Chest pain   . Cerebral atherosclerosis 12/11/2011    Carotid duplex - R ICA 0-49% diameter reduction, velocities suggest high  end; L ICA 0-49% reduction, velocities suggest mid range  . Neuropathy ([place])   . Syncope   . Chronic anticoagulation 11/11/2013  . Orthostatic hypotension 11/11/2013  . Cancer [facility])     facial   Assessment: 79yo female with h/o stroke and afib.  On Chronic Coumadin at home. Pt is currently anticoagulated with INR > 3 (SUPRAtherapeutic).  CBC is OK.  Goal of Therapy:  INR 2-3 Monitor platelets by anticoagulation protocol: Yes   Plan:  HOLD Coumadin today Check INR daily Monitor CBC and for s/sx bleeding  Hart Robinsons A 12/15/2014,10:18 AM

## 2014-12-15 NOTE — Progress Notes (Signed)
Notified family of patient's move to room 332.  Patient in stable condition.

## 2014-12-15 NOTE — Progress Notes (Signed)
[place] for Heparin (when INR < 2) Indication: atrial fibrillation  Allergies  Allergen Reactions  . Ciprofloxacin Anaphylaxis and Rash  . Codeine   . Contrast Media [Iodinated Diagnostic Agents]     Per pt chart.  . Iodine   . Lovenox [Enoxaparin] Other (See Comments)    Unknown reaction  . Nitrofurantoin Other (See Comments)    Numbness in arms and legs  . Nitrofurantoin Monohyd Macro Other (See Comments)  . Penicillins Rash  . Sulfa Antibiotics Rash  . Sulfonamide Derivatives Rash   Patient Measurements: Height: 5\' 2"  (157.5 cm) Weight: 102 lb 11.8 oz (46.6 kg) IBW/kg (Calculated) : 50.1 Heparin Dosing Weight: 47.6Kg  Vital Signs: Temp: 98.7 F (37.1 C) (10/25 0800) Temp Source: Oral (10/25 0800) BP: 138/69 mmHg (10/25 0630) Pulse Rate: 92 (10/25 0630)  Labs:  Recent Labs  12/14/14 1438 12/14/14 1445 12/14/14 1530 12/15/14 0410  HGB 15.3* 16.3*  --  13.3  HCT 43.9 48.0*  --  40.3  PLT 234  --   --  216  LABPROT  --   --  31.2* 38.4*  INR  --   --  3.08* 4.05*  CREATININE 1.42* 1.50*  --  0.76  TROPONINI 0.04*  --   --   --     Estimated Creatinine Clearance: 43.3 mL/min (by C-G formula based on Cr of 0.76).  Medical History: Past Medical History  Diagnosis Date  . Hypertension   . Stroke ([place])   . Acid reflux   . Atrial fibrillation (HCC)     tachybradycardia syndrome  . Tobacco abuse 04/30/2011  . Cardiomyopathy 05/02/2011    EF 45-50%, per Echo.  . 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 Carotid artery stenosis 05/02/2011  . CAD (coronary artery disease) 07/20/2006    R/S MV - mild perfusion defect in apical region consistent w/ infarct/scar vs apical thinning; no scintigraphic evidence of inducible myocardial ischemia; baseline EKG demonstrates A Flutter w/ variable AV conduction  . Hyperlipidemia   . Chest pain   . Cerebral atherosclerosis 12/11/2011    Carotid duplex - R ICA 0-49% diameter  reduction, velocities suggest high end; L ICA 0-49% reduction, velocities suggest mid range  . Neuropathy ([place])   . Syncope   . Chronic anticoagulation 11/11/2013  . Orthostatic hypotension 11/11/2013  . Cancer [facility])     facial   Assessment: 79yo female with h/o stroke and afib.  On Chronic Coumadin at home. Pt is currently anticoagulated with INR > 3 (SUPRAtherapeutic).  Will begin Heparin when INR < 2.  CBC is OK.  Goal of Therapy:  Heparin level 0.3-0.7 units/ml Monitor platelets by anticoagulation protocol: Yes   Plan:  HOLD Heparin for now, start when INR < 2 Check INR daily Monitor CBC and for s/sx bleeding  Hart Robinsons A 12/15/2014,8:31 AM

## 2014-12-15 NOTE — Progress Notes (Signed)
[facility] PROGRESS NOTE  Andrea [name] NAT:[id number] DOB: 09-Jul-1935 DOA: 12/14/2014 PCP: Collene Mares, PA-C  Assessment/Plan: PSBO -Clinically resolved. -Patient with multiple BMs overnight, no abd pain, n/v. -Advance diet to clears. -Transfer to med surg. -Appreciate surgery's input and recommendations.  A Fib -Rate controlled. -Anticoagulated on coumadin and currently supratherapeutic at 4. -Since resuming PO route today, will transition back from heparin to coumadin (hold today as INR >4).  HTN -Was hypotensive on admission. -Meds on hold. -BP has normalized overnight. Will refrain from restarting home meds today.  Weak/Deconditioning -PT eval. -Will need to discuss risk-benefit of coumadin with family; given her age, frailty and dementia I believe she is a high fall risk.  Code Status: Presumed full code Family Communication: Patient only  Disposition Plan: to be determined. Lives at home with husband   Consultants:  Surgery, Dr. Arnoldo Morale   Antibiotics:  None   Subjective: Confused, not oriented, has had multiple BMs overnight, no emesis.  Objective: Filed Vitals:   12/15/14 0530 12/15/14 0600 12/15/14 0630 12/15/14 0800  BP: 140/87  138/69   Pulse: 84 88 92   Temp:    98.7 F (37.1 C)  TempSrc:    Oral  Resp: 25 21 16    Height:      Weight:      SpO2: 100% 100% 97%     Intake/Output Summary (Last 24 hours) at 12/15/14 0958 Last data filed at 12/15/14 0842  Gross per 24 hour  Intake 1547.92 ml  Output      0 ml  Net 1547.92 ml   Filed Weights   12/14/14 1403 12/15/14 0350  Weight: 47.628 kg (105 lb) 46.6 kg (102 lb 11.8 oz)    Exam:   General:  Awake, not oriented  Cardiovascular: RRR  Respiratory: CTA B  Abdomen: S/NT/ND/+BS  Extremities: no C/C/E   Neurologic:  Moves all 4 spontaneously, appears generally weak and frail.  Data Reviewed: Basic Metabolic Panel:  Recent Labs Lab 12/14/14 1438  12/14/14 1445 12/15/14 0410  NA 133* 133* 136  K 3.9 3.8 3.5  CL 92* 93* 107  CO2 27  --  21*  GLUCOSE 115* 116* 81  BUN 27* 25* 23*  CREATININE 1.42* 1.50* 0.76  CALCIUM 10.2  --  8.6*   Liver Function Tests:  Recent Labs Lab 12/14/14 1438 12/15/14 0410  AST 29 25  ALT 20 16  ALKPHOS 81 63  BILITOT 1.3* 1.1  PROT 7.9 6.2*  ALBUMIN 4.4 3.5   No results for input(s): LIPASE, AMYLASE in the last 168 hours. No results for input(s): AMMONIA in the last 168 hours. CBC:  Recent Labs Lab 12/14/14 1438 12/14/14 1445 12/15/14 0410  WBC 9.1  --  7.5  NEUTROABS 7.5  --   --   HGB 15.3* 16.3* 13.3  HCT 43.9 48.0* 40.3  MCV 89.2  --  92.2  PLT 234  --  216   Cardiac Enzymes:  Recent Labs Lab 12/14/14 1438  TROPONINI 0.04*   BNP (last 3 results) No results for input(s): BNP in the last 8760 hours.  ProBNP (last 3 results) No results for input(s): PROBNP in the last 8760 hours.  CBG: No results for input(s): GLUCAP in the last 168 hours.  Recent Results (from the past 240 hour(s))  Urine culture     Status: None (Preliminary result)   Collection Time: 12/14/14  4:03 PM  Result Value Ref Range Status   Specimen Description URINE, CATHETERIZED  Final   Special Requests NONE  Final   Culture   Final    TOO YOUNG TO READ Performed at [facility]    Report Status PENDING  Incomplete  MRSA PCR Screening     Status: None   Collection Time: 12/14/14  5:30 PM  Result Value Ref Range Status   MRSA by PCR NEGATIVE NEGATIVE Final    Comment:        The GeneXpert MRSA Assay (FDA approved for NASAL specimens only), is one component of a comprehensive MRSA colonization surveillance program. It is not intended to diagnose MRSA infection nor to guide or monitor treatment for MRSA infections.      Studies: Ct Abdomen Pelvis Wo Contrast  12/14/2014  CLINICAL DATA:  79 year old with hypotension. Abdominal pain and vomiting. EXAM: CT ABDOMEN AND PELVIS  WITHOUT CONTRAST TECHNIQUE: Multidetector CT imaging of the abdomen and pelvis was performed following the standard protocol without IV contrast. COMPARISON:  None. FINDINGS: Lower chest: Volume loss in the left lung related to elevated left hemidiaphragm. Hepatobiliary: No gross abnormality to the liver on this noncontrast examination. Difficult to know if the patient has a gallbladder due to fluid-filled loops of bowel in the right upper abdomen. Pancreas: No gross abnormality to pancreas on this noncontrast examination. Spleen: Normal appearance of the spleen. Adrenals/Urinary Tract: Mild fullness of the right adrenal gland. No gross abnormality to the left adrenal gland. High-density structures along the medial cortex of left kidney, with Hounsfield units of 57. These could represent proteinaceous or hyperdense cysts but indeterminate. There is probably a low-density cyst along the left kidney lower pole but this is poorly characterized. Question a small cyst in the right kidney upper pole. No significant hydronephrosis in either kidney. There is fluid in the urinary bladder without gross abnormality. Stomach/Bowel: Marked elevation of the left hemidiaphragm. Distal stomach may be narrowed due to compression from the stomach and related to the elevated left hemidiaphragm. Fluid and gas filled dilated loops of small bowel throughout the abdomen. Small bowel loops measure 3.5 cm. The left side of the colon contains a large amount of stool but difficult to follow the course of the large bowel. Difficult to identify the cecum or terminal ileum based on the dilated loops of bowel. These findings are concerning for a bowel obstruction but the transition point is unclear. Vascular/Lymphatic: There is no significant lymphadenopathy. There is an infrarenal abdominal aortic aneurysm with calcified walls. The aneurysm measures up to 3.9 cm on sequence 2, image 43. Reproductive: Uterus is present without gross abnormality.  Limited evaluation of the adnexal tissue. Other: No evidence for free fluid. Musculoskeletal: Dextroscoliosis in the lumbar spine with multilevel facet disease. Grade 1 anterolisthesis at L4-L5 appears to secondary to facet disease. There is severe disc space loss at L2-L3 and L3-L4. IMPRESSION: Dilating loops of bowel, predominantly small bowel. Findings are suggestive for a bowel obstruction. Unclear if the obstruction is involving the small bowel or the colon. Limited evaluation of the right colon and transverse colon due to the lack of IV contrast and multiple dilated small bowel loops. Abdominal aortic aneurysm measuring up to 3.9 cm. No evidence to suggest an aortic rupture or complication. Indeterminate hypodense and hyperdense structures involving the kidneys. These probably represents cyst but indeterminate. This may be better characterized with an ultrasound. Multilevel degenerative disease in the lumbar spine. Electronically Signed   By: Markus Daft M.D.   On: 12/14/2014 15:59   Dg Chest Portable 1 View  12/14/2014  CLINICAL DATA:  Hypotension.  Abdominal pain.  Symptoms today. EXAM: PORTABLE CHEST 1 VIEW COMPARISON:  Single view of the chest 11/05/2013. FINDINGS: Marked asymmetric elevation of the left hemidiaphragm relative to the right is unchanged. The lungs are clear. Heart size is enlarged. No pneumothorax or pleural effusion. IMPRESSION: Cardiomegaly without acute disease. Electronically Signed   By: Inge Rise M.D.   On: 12/14/2014 15:01    Scheduled Meds: . antiseptic oral rinse  7 mL Mouth Rinse q12n4p  . chlorhexidine  15 mL Mouth Rinse BID  . sodium chloride  3 mL Intravenous Q12H   Continuous Infusions: . sodium chloride 125 mL/hr at 12/15/14 0677    Active Problems:   Aphasia   Atrial fibrillation, permanent    Essential hypertension   Chronic anticoagulation   SBO (small bowel obstruction) (HCC)   Hypotension   Small bowel obstruction ([place])    Time spent: 25  minutes. Greater than 50% of this time was spent in direct contact with the patient coordinating care.    Lelon Frohlich  [facility] Pager [phone]  If 7PM-7AM, please contact night-coverage at [email or web address], password [facility] 12/15/2014, 9:58 AM  LOS: 1 day

## 2014-12-15 NOTE — Progress Notes (Signed)
Called report to Andrea Aris, RN on dept 300.  Verbalized understanding. Pt transferred to room 335 in safe and stable condition.

## 2014-12-16 DIAGNOSIS — K5669 Other intestinal obstruction: Secondary | ICD-10-CM

## 2014-12-16 DIAGNOSIS — Z7901 Long term (current) use of anticoagulants: Secondary | ICD-10-CM

## 2014-12-16 DIAGNOSIS — R4701 Aphasia: Secondary | ICD-10-CM

## 2014-12-16 DIAGNOSIS — I10 Essential (primary) hypertension: Secondary | ICD-10-CM

## 2014-12-16 DIAGNOSIS — I480 Paroxysmal atrial fibrillation: Secondary | ICD-10-CM

## 2014-12-16 LAB — PROTIME-INR
INR: 3.6 — ABNORMAL HIGH (ref 0.00–1.49)
Prothrombin Time: 35.1 seconds — ABNORMAL HIGH (ref 11.6–15.2)

## 2014-12-16 MED ORDER — WARFARIN - PHARMACIST DOSING INPATIENT: Status: DC

## 2014-12-16 NOTE — Progress Notes (Signed)
[facility] PROGRESS NOTE  LAMYA LAUSCH DXA:[id number] DOB: 07-Jan-1936    PCP:   Collene Mares, PA-C   HPI: Andrea [name] is an 79 y.o. female with hx of dementia, prior CVA and aphasia, afib on Coumadin with supratherapeutic INR, orthostatic hypotension, admitted for SBO and has been followed by surgery.  Dr Arnoldo Morale recommended to advance her diet as tolerated.  Her SBO has resolved.    Rewiew of Systems: Unable   Past Medical History  Diagnosis Date  . Hypertension   . Stroke ([place])   . Acid reflux   . Atrial fibrillation (HCC)     tachybradycardia syndrome  . Tobacco abuse 04/30/2011  . Cardiomyopathy 05/02/2011    EF 45-50%, per Echo.  . 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 Carotid artery stenosis 05/02/2011  . CAD (coronary artery disease) 07/20/2006    R/S MV - mild perfusion defect in apical region consistent w/ infarct/scar vs apical thinning; no scintigraphic evidence of inducible myocardial ischemia; baseline EKG demonstrates A Flutter w/ variable AV conduction  . Hyperlipidemia   . Chest pain   . Cerebral atherosclerosis 12/11/2011    Carotid duplex - R ICA 0-49% diameter reduction, velocities suggest high end; L ICA 0-49% reduction, velocities suggest mid range  . Neuropathy ([place])   . Syncope   . Chronic anticoagulation 11/11/2013  . Orthostatic hypotension 11/11/2013  . Cancer [facility])     facial    Past Surgical History  Procedure Laterality Date  . Cholecystectomy    . Skin cancer excision    . Tonsillectomy    . Tee with cardioversion      failed in past but dates not given    Medications:  HOME MEDS: Prior to Admission medications   Medication Sig Start Date End Date Taking? Authorizing Provider  atorvastatin (LIPITOR) 10 MG tablet take 1 tablet by mouth at bedtime 05/21/14  Yes Lorretta Harp, MD  CALCIUM-VITAMIN D PO Take 1 tablet by mouth daily.   Yes Historical Provider, MD  clotrimazole-betamethasone (LOTRISONE) cream Apply  1 application topically daily as needed (for irritation).  02/10/14  Yes Historical Provider, MD  docusate sodium 100 MG CAPS Take 100 mg by mouth 2 (two) times daily. Patient taking differently: Take 100 mg by mouth daily as needed (for constipation).  11/11/13  Yes Isaiah Serge, NP  esomeprazole (NEXIUM) 20 MG capsule Take 20 mg by mouth daily before breakfast.   Yes Historical Provider, MD  estazolam (PROSOM) 2 MG tablet Take 2 mg by mouth at bedtime.   Yes Historical Provider, MD  metoprolol tartrate (LOPRESSOR) 25 MG tablet Take 12.5 mg by mouth daily.   Yes Historical Provider, MD  MYRBETRIQ 25 MG TB24 Take 25 mg by mouth daily. 06/27/12  Yes Historical Provider, MD  niacin 500 MG tablet Take 500 mg by mouth daily.   Yes Historical Provider, MD  NITROSTAT 0.4 MG SL tablet Place 1 tablet (0.4 mg total) under the tongue every 5 (five) minutes as needed for chest pain. 05/13/14  Yes Lorretta Harp, MD  oxybutynin (DITROPAN-XL) 10 MG 24 hr tablet Take 10 mg by mouth every morning.    Yes Historical Provider, MD  oxyCODONE-acetaminophen (PERCOCET/ROXICET) 5-325 MG tablet Take 1 tablet by mouth 4 (four) times daily as needed. For pain 12/09/14  Yes Historical Provider, MD  polyethylene glycol (MIRALAX / GLYCOLAX) packet Take 17 g by mouth daily. Patient taking differently: Take 17 g by mouth daily  as needed.  11/11/13  Yes Isaiah Serge, NP  Potassium 99 MG TABS Take 1 tablet by mouth daily.   Yes Historical Provider, MD  prazosin (MINIPRESS) 2 MG capsule Take 2 mg by mouth 2 (two) times daily.    Yes Historical Provider, MD  sertraline (ZOLOFT) 50 MG tablet Take 50 mg by mouth daily. 11/17/14  Yes Historical Provider, MD  traMADol-acetaminophen (ULTRACET) 37.5-325 MG per tablet Take 37.5 tablets by mouth daily as needed for moderate pain or severe pain (*May take two tablets three times daily for hip pain).  11/03/13  Yes Historical Provider, MD  triamcinolone cream (KENALOG) 0.1 % Apply 1  application topically daily as needed (for itching/irritation).  02/26/14  Yes Historical Provider, MD  trimethoprim (TRIMPEX) 100 MG tablet Take 100 mg by mouth daily.  01/07/13  Yes Historical Provider, MD  warfarin (COUMADIN) 3 MG tablet Dr Gerarda Fraction follows; alternates 3mg  with 4mg  02/23/14  Yes Historical Provider, MD  warfarin (COUMADIN) 4 MG tablet Take 1 tablet (4 mg total) by mouth every evening. Holding for 2 days then recheck at PCP 11/13/13  Yes Isaiah Serge, NP     Allergies:  Allergies  Allergen Reactions  . Ciprofloxacin Anaphylaxis and Rash  . Codeine   . Contrast Media [Iodinated Diagnostic Agents]     Per pt chart.  . Iodine   . Lovenox [Enoxaparin] Other (See Comments)    Unknown reaction  . Nitrofurantoin Other (See Comments)    Numbness in arms and legs  . Nitrofurantoin Monohyd Macro Other (See Comments)  . Penicillins Rash  . Sulfa Antibiotics Rash  . Sulfonamide Derivatives Rash    Social History:   reports that she has been smoking Cigarettes.  She has a 20 pack-year smoking history. She has never used smokeless tobacco. She reports that she does not drink alcohol or use illicit drugs.  Family History: Family History  Problem Relation Age of Onset  . Adopted: Yes     Physical Exam: Filed Vitals:   12/15/14 1115 12/15/14 1341 12/15/14 2202 12/16/14 0556  BP: 154/91 146/88 156/88 160/68  Pulse: 68 72 96 79  Temp: 98.6 F (37 C) 98.8 F (37.1 C) 99 F (37.2 C) 97.9 F (36.6 C)  TempSrc: Oral Oral Oral Oral  Resp: 18 20 20 18   Height: 5\' 2"  (1.575 m)     Weight: 46.766 kg (103 lb 1.6 oz)     SpO2: 98% 98% 97% 96%   Blood pressure 160/68, pulse 79, temperature 97.9 F (36.6 C), temperature source Oral, resp. rate 18, height 5\' 2"  (1.575 m), weight 46.766 kg (103 lb 1.6 oz), SpO2 96 %.  GEN:  Pleasant  patient lying in the stretcher in no acute distress PSYCH:  alert and oriented x4; does not appear anxious or depressed; affect is  appropriate. HEENT: Mucous membranes pink and anicteric; PERRLA; EOM intact; no cervical lymphadenopathy nor thyromegaly or carotid bruit; no JVD; There were no stridor. Neck is very supple. Breasts:: Not examined CHEST WALL: No tenderness CHEST: Normal respiration, clear to auscultation bilaterally.  HEART: Regular rate and rhythm.  There are no murmur, rub, or gallops.   BACK: No kyphosis or scoliosis; no CVA tenderness ABDOMEN: soft and non-tender; no masses, no organomegaly, normal abdominal bowel sounds; no pannus; no intertriginous candida. There is no rebound and no distention. Rectal Exam: Not done EXTREMITIES: No bone or joint deformity; age-appropriate arthropathy of the hands and knees; no edema; no ulcerations.  There is  no calf tenderness. Genitalia: not examined PULSES: 2+ and symmetric SKIN: Normal hydration no rash or ulceration CNS: Cranial nerves 2-12 grossly intact no focal lateralizing neurologic deficit.  Speech is fluent; uvula elevated with phonation, facial symmetry and tongue midline. DTR are normal bilaterally, cerebella exam is intact, barbinski is negative and strengths are equaled bilaterally.  No sensory loss.   Labs on Admission:  Basic Metabolic Panel:  Recent Labs Lab 12/14/14 1438 12/14/14 1445 12/15/14 0410  NA 133* 133* 136  K 3.9 3.8 3.5  CL 92* 93* 107  CO2 27  --  21*  GLUCOSE 115* 116* 81  BUN 27* 25* 23*  CREATININE 1.42* 1.50* 0.76  CALCIUM 10.2  --  8.6*   Liver Function Tests:  Recent Labs Lab 12/14/14 1438 12/15/14 0410  AST 29 25  ALT 20 16  ALKPHOS 81 63  BILITOT 1.3* 1.1  PROT 7.9 6.2*  ALBUMIN 4.4 3.5   CBC:  Recent Labs Lab 12/14/14 1438 12/14/14 1445 12/15/14 0410  WBC 9.1  --  7.5  NEUTROABS 7.5  --   --   HGB 15.3* 16.3* 13.3  HCT 43.9 48.0* 40.3  MCV 89.2  --  92.2  PLT 234  --  216   Cardiac Enzymes:  Recent Labs Lab 12/14/14 1438  TROPONINI 0.04*    Radiological Exams on Admission: Ct Abdomen  Pelvis Wo Contrast  12/14/2014  CLINICAL DATA:  79 year old with hypotension. Abdominal pain and vomiting. EXAM: CT ABDOMEN AND PELVIS WITHOUT CONTRAST TECHNIQUE: Multidetector CT imaging of the abdomen and pelvis was performed following the standard protocol without IV contrast. COMPARISON:  None. FINDINGS: Lower chest: Volume loss in the left lung related to elevated left hemidiaphragm. Hepatobiliary: No gross abnormality to the liver on this noncontrast examination. Difficult to know if the patient has a gallbladder due to fluid-filled loops of bowel in the right upper abdomen. Pancreas: No gross abnormality to pancreas on this noncontrast examination. Spleen: Normal appearance of the spleen. Adrenals/Urinary Tract: Mild fullness of the right adrenal gland. No gross abnormality to the left adrenal gland. High-density structures along the medial cortex of left kidney, with Hounsfield units of 57. These could represent proteinaceous or hyperdense cysts but indeterminate. There is probably a low-density cyst along the left kidney lower pole but this is poorly characterized. Question a small cyst in the right kidney upper pole. No significant hydronephrosis in either kidney. There is fluid in the urinary bladder without gross abnormality. Stomach/Bowel: Marked elevation of the left hemidiaphragm. Distal stomach may be narrowed due to compression from the stomach and related to the elevated left hemidiaphragm. Fluid and gas filled dilated loops of small bowel throughout the abdomen. Small bowel loops measure 3.5 cm. The left side of the colon contains a large amount of stool but difficult to follow the course of the large bowel. Difficult to identify the cecum or terminal ileum based on the dilated loops of bowel. These findings are concerning for a bowel obstruction but the transition point is unclear. Vascular/Lymphatic: There is no significant lymphadenopathy. There is an infrarenal abdominal aortic aneurysm with  calcified walls. The aneurysm measures up to 3.9 cm on sequence 2, image 43. Reproductive: Uterus is present without gross abnormality. Limited evaluation of the adnexal tissue. Other: No evidence for free fluid. Musculoskeletal: Dextroscoliosis in the lumbar spine with multilevel facet disease. Grade 1 anterolisthesis at L4-L5 appears to secondary to facet disease. There is severe disc space loss at L2-L3 and L3-L4. IMPRESSION: Dilating  loops of bowel, predominantly small bowel. Findings are suggestive for a bowel obstruction. Unclear if the obstruction is involving the small bowel or the colon. Limited evaluation of the right colon and transverse colon due to the lack of IV contrast and multiple dilated small bowel loops. Abdominal aortic aneurysm measuring up to 3.9 cm. No evidence to suggest an aortic rupture or complication. Indeterminate hypodense and hyperdense structures involving the kidneys. These probably represents cyst but indeterminate. This may be better characterized with an ultrasound. Multilevel degenerative disease in the lumbar spine. Electronically Signed   By: Markus Daft M.D.   On: 12/14/2014 15:59   Dg Chest Portable 1 View  12/14/2014  CLINICAL DATA:  Hypotension.  Abdominal pain.  Symptoms today. EXAM: PORTABLE CHEST 1 VIEW COMPARISON:  Single view of the chest 11/05/2013. FINDINGS: Marked asymmetric elevation of the left hemidiaphragm relative to the right is unchanged. The lungs are clear. Heart size is enlarged. No pneumothorax or pleural effusion. IMPRESSION: Cardiomegaly without acute disease. Electronically Signed   By: Inge Rise M.D.   On: 12/14/2014 15:01   Assessment/Plan Present on Admission:  . SBO (small bowel obstruction) ([place]) . Atrial fibrillation, permanent  . Essential hypertension . Hypotension . Aphasia . Small bowel obstruction (HCC)  PLAN:   SBO:  Will advance diet as tolerated.    Afib:  Continue with anticoagulation.  Warfarin dosing per  pharmacy.  HTN:  Stable.  CVA and Aphasia.   Code Status: FULL Haskel Khan, MD. [facility] Pager [phone] 7pm to 7am.  12/16/2014, 12:44 PM

## 2014-12-16 NOTE — Progress Notes (Signed)
Subjective: Patient is noncommunicative.  Objective: Vital signs in last 24 hours: Temp:  [97.9 F (36.6 C)-99 F (37.2 C)] 97.9 F (36.6 C) (10/26 0556) Pulse Rate:  [68-96] 79 (10/26 0556) Resp:  [17-20] 18 (10/26 0556) BP: (136-160)/(68-91) 160/68 mmHg (10/26 0556) SpO2:  [96 %-98 %] 96 % (10/26 0556) Weight:  [46.766 kg (103 lb 1.6 oz)] 46.766 kg (103 lb 1.6 oz) (10/25 1115) Last BM Date: 12/15/14  Intake/Output from previous day: 10/25 0701 - 10/26 0700 In: 553.3 [P.O.:120; I.V.:433.3] Out: -  Intake/Output this shift:    General appearance: no distress GI: soft, non-tender; bowel sounds normal; no masses,  no organomegaly  Lab Results:   Recent Labs  12/14/14 1438 12/14/14 1445 12/15/14 0410  WBC 9.1  --  7.5  HGB 15.3* 16.3* 13.3  HCT 43.9 48.0* 40.3  PLT 234  --  216   BMET  Recent Labs  12/14/14 1438 12/14/14 1445 12/15/14 0410  NA 133* 133* 136  K 3.9 3.8 3.5  CL 92* 93* 107  CO2 27  --  21*  GLUCOSE 115* 116* 81  BUN 27* 25* 23*  CREATININE 1.42* 1.50* 0.76  CALCIUM 10.2  --  8.6*   PT/INR  Recent Labs  12/15/14 0410 12/16/14 0629  LABPROT 38.4* 35.1*  INR 4.05* 3.60*    Studies/Results: Ct Abdomen Pelvis Wo Contrast  12/14/2014  CLINICAL DATA:  79 year old with hypotension. Abdominal pain and vomiting. EXAM: CT ABDOMEN AND PELVIS WITHOUT CONTRAST TECHNIQUE: Multidetector CT imaging of the abdomen and pelvis was performed following the standard protocol without IV contrast. COMPARISON:  None. FINDINGS: Lower chest: Volume loss in the left lung related to elevated left hemidiaphragm. Hepatobiliary: No gross abnormality to the liver on this noncontrast examination. Difficult to know if the patient has a gallbladder due to fluid-filled loops of bowel in the right upper abdomen. Pancreas: No gross abnormality to pancreas on this noncontrast examination. Spleen: Normal appearance of the spleen. Adrenals/Urinary Tract: Mild fullness of the  right adrenal gland. No gross abnormality to the left adrenal gland. High-density structures along the medial cortex of left kidney, with Hounsfield units of 57. These could represent proteinaceous or hyperdense cysts but indeterminate. There is probably a low-density cyst along the left kidney lower pole but this is poorly characterized. Question a small cyst in the right kidney upper pole. No significant hydronephrosis in either kidney. There is fluid in the urinary bladder without gross abnormality. Stomach/Bowel: Marked elevation of the left hemidiaphragm. Distal stomach may be narrowed due to compression from the stomach and related to the elevated left hemidiaphragm. Fluid and gas filled dilated loops of small bowel throughout the abdomen. Small bowel loops measure 3.5 cm. The left side of the colon contains a large amount of stool but difficult to follow the course of the large bowel. Difficult to identify the cecum or terminal ileum based on the dilated loops of bowel. These findings are concerning for a bowel obstruction but the transition point is unclear. Vascular/Lymphatic: There is no significant lymphadenopathy. There is an infrarenal abdominal aortic aneurysm with calcified walls. The aneurysm measures up to 3.9 cm on sequence 2, image 43. Reproductive: Uterus is present without gross abnormality. Limited evaluation of the adnexal tissue. Other: No evidence for free fluid. Musculoskeletal: Dextroscoliosis in the lumbar spine with multilevel facet disease. Grade 1 anterolisthesis at L4-L5 appears to secondary to facet disease. There is severe disc space loss at L2-L3 and L3-L4. IMPRESSION: Dilating loops of bowel, predominantly  small bowel. Findings are suggestive for a bowel obstruction. Unclear if the obstruction is involving the small bowel or the colon. Limited evaluation of the right colon and transverse colon due to the lack of IV contrast and multiple dilated small bowel loops. Abdominal aortic  aneurysm measuring up to 3.9 cm. No evidence to suggest an aortic rupture or complication. Indeterminate hypodense and hyperdense structures involving the kidneys. These probably represents cyst but indeterminate. This may be better characterized with an ultrasound. Multilevel degenerative disease in the lumbar spine. Electronically Signed   By: Markus Daft M.D.   On: 12/14/2014 15:59   Dg Chest Portable 1 View  12/14/2014  CLINICAL DATA:  Hypotension.  Abdominal pain.  Symptoms today. EXAM: PORTABLE CHEST 1 VIEW COMPARISON:  Single view of the chest 11/05/2013. FINDINGS: Marked asymmetric elevation of the left hemidiaphragm relative to the right is unchanged. The lungs are clear. Heart size is enlarged. No pneumothorax or pleural effusion. IMPRESSION: Cardiomegaly without acute disease. Electronically Signed   By: Inge Rise M.D.   On: 12/14/2014 15:01    Anti-infectives: Anti-infectives    None      Assessment/Plan: Impression: Partial small bowel obstruction, resolved. Patient has had 5 bowel movements since I last saw her. Plan: Advance diet as tolerated. Will sign off for now. Please call me if I can be of further assistance.  LOS: 2 days    Jkai Arwood A 12/16/2014

## 2014-12-16 NOTE — Clinical Documentation Improvement (Signed)
Hospitalist  (please document our query response in the progress notes and discharge summary, not on the query form itself.)  Possible Clinical Conditions:  - Acute Kidney Injury (AKI), resolved  - Other condition  - Unable to clinically determine  Clinical Information: Patient presented with complaints of vomiting and constipation admitted with small bowel obstruction Creatinine improved 0.66 mg/dL in 24 hours  BUN/Creat/GFR trend this admission   (white female) Component     Latest Ref Rng 12/14/2014 12/14/2014 12/14/2014 12/15/2014         2:38 PM  2:45 PM  2:51 PM   Creatinine     0.44 - 1.00 mg/dL 1.42 (H) 1.50 (H)  0.76  EGFR (Non-African Amer.)     >60 mL/min 35 (L)   >60    Please exercise your independent, professional judgment when responding. A specific answer is not anticipated or expected.   Thank You, Erling Conte  RN BSN CCDS [phone] Health Information Management [place]

## 2014-12-16 NOTE — Progress Notes (Signed)
[facility] for Coumadin (chronic Rx PTA) Indication: atrial fibrillation  Allergies  Allergen Reactions  . Ciprofloxacin Anaphylaxis and Rash  . Codeine   . Contrast Media [Iodinated Diagnostic Agents]     Per pt chart.  . Iodine   . Lovenox [Enoxaparin] Other (See Comments)    Unknown reaction  . Nitrofurantoin Other (See Comments)    Numbness in arms and legs  . Nitrofurantoin Monohyd Macro Other (See Comments)  . Penicillins Rash  . Sulfa Antibiotics Rash  . Sulfonamide Derivatives Rash   Patient Measurements: Height: 5\' 2"  (157.5 cm) Weight: 103 lb 1.6 oz (46.766 kg) IBW/kg (Calculated) : 50.1  Vital Signs: Temp: 97.9 F (36.6 C) (10/26 0556) Temp Source: Oral (10/26 0556) BP: 160/68 mmHg (10/26 0556) Pulse Rate: 79 (10/26 0556)  Labs:  Recent Labs  12/14/14 1438 12/14/14 1445 12/14/14 1530 12/15/14 0410 12/16/14 0629  HGB 15.3* 16.3*  --  13.3  --   HCT 43.9 48.0*  --  40.3  --   PLT 234  --   --  216  --   LABPROT  --   --  31.2* 38.4* 35.1*  INR  --   --  3.08* 4.05* 3.60*  CREATININE 1.42* 1.50*  --  0.76  --   TROPONINI 0.04*  --   --   --   --    Estimated Creatinine Clearance: 43.5 mL/min (by C-G formula based on Cr of 0.76).  Medical History: Past Medical History  Diagnosis Date  . Hypertension   . Stroke ([place])   . Acid reflux   . Atrial fibrillation (HCC)     tachybradycardia syndrome  . Tobacco abuse 04/30/2011  . Cardiomyopathy 05/02/2011    EF 45-50%, per Echo.  . 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 Carotid artery stenosis 05/02/2011  . CAD (coronary artery disease) 07/20/2006    R/S MV - mild perfusion defect in apical region consistent w/ infarct/scar vs apical thinning; no scintigraphic evidence of inducible myocardial ischemia; baseline EKG demonstrates A Flutter w/ variable AV conduction  . Hyperlipidemia   . Chest pain   . Cerebral atherosclerosis 12/11/2011    Carotid duplex - R  ICA 0-49% diameter reduction, velocities suggest high end; L ICA 0-49% reduction, velocities suggest mid range  . Neuropathy ([place])   . Syncope   . Chronic anticoagulation 11/11/2013  . Orthostatic hypotension 11/11/2013  . Cancer [facility])     facial   Assessment: 79yo female with h/o stroke and afib.  On Chronic Coumadin at home. Pt is currently anticoagulated with INR > 3 (SUPRAtherapeutic).  CBC is OK.  Goal of Therapy:  INR 2-3 Monitor platelets by anticoagulation protocol: Yes   Plan:  HOLD Coumadin today Check INR daily Monitor CBC and for s/sx bleeding  Hart Robinsons A 12/16/2014,8:33 AM

## 2014-12-17 LAB — URINE CULTURE

## 2014-12-17 LAB — PROTIME-INR
INR: 2.98 — ABNORMAL HIGH (ref 0.00–1.49)
Prothrombin Time: 30.4 seconds — ABNORMAL HIGH (ref 11.6–15.2)

## 2014-12-17 MED ORDER — WARFARIN SODIUM 5 MG PO TABS: 2.5000 mg | ORAL_TABLET | Freq: Once | ORAL | Status: DC

## 2014-12-17 MED ORDER — CLOPIDOGREL BISULFATE 75 MG PO TABS
75.0000 mg | ORAL_TABLET | Freq: Every day | ORAL | Status: DC
  Administered 2014-12-17: 75 mg via ORAL
  Filled 2014-12-17: qty 1

## 2014-12-17 MED ORDER — CLOPIDOGREL BISULFATE 75 MG PO TABS: 75.0000 mg | ORAL_TABLET | Freq: Every day | ORAL | Status: AC

## 2014-12-17 MED ORDER — ASPIRIN EC 81 MG PO TBEC: 81.0000 mg | DELAYED_RELEASE_TABLET | Freq: Every day | ORAL | Status: AC

## 2014-12-17 MED ORDER — CEFUROXIME AXETIL 500 MG PO TABS: 500.0000 mg | ORAL_TABLET | Freq: Two times a day (BID) | ORAL | Status: DC

## 2014-12-17 NOTE — Progress Notes (Addendum)
IV removed. Discharge instructions reviewed with patient and husband. Understanding verbalized by husband. Ready for discharge home.

## 2014-12-17 NOTE — Discharge Summary (Addendum)
Physician Discharge Summary  LALONI ROWTON UUV:[id number] DOB: Jun 30, 1935 DOA: 12/14/2014  PCP: Collene Mares, PA-C  Admit date: 12/14/2014 Discharge date: 12/17/2014  Time spent: 35 minutes  Recommendations for Outpatient Follow-up:  1. Follow up with PCP in one week.   Discharge Diagnoses:  Active Problems:   Aphasia   Atrial fibrillation, permanent    Essential hypertension   Chronic anticoagulation   SBO (small bowel obstruction) (HCC)   Hypotension   Small bowel obstruction ([place])   Discharge Condition:  Stable.   Diet recommendation: as tolerated.   Filed Weights   12/14/14 1403 12/15/14 0350 12/15/14 1115  Weight: 47.628 kg (105 lb) 46.6 kg (102 lb 11.8 oz) 46.766 kg (103 lb 1.6 oz)    History of present illness:  Patient was admitted for SBO by Dr Anastasio Champion on Dec 14, 2014.  As per his H and P:  " CLEATUS GOODIN is a 79 y.o. female  This is a 79 year old lady who has aphasia from previous stroke and presents now with vomiting, 4 times in last 24 hours associated with constipation. She has a history of hypertension but was found to be hypotensive in the emergency room. According to the husband, who is at the bedside, she also had abdominal pain around her umbilicus. She appeared to have an umbilical hernia on presentation to the emergency room, which was reduced. Her symptoms seem to improve after this but CT scan suggests small bowel obstruction. She is now being admitted for further management. There is no fever, diarrhea.   Hospital Course:  Patient was admitted into the hospital, and surgery was consulted.  She was seen in consultation with Dr Arnoldo Morale, who recommended conservative Tx.  She was made NPO, and was given IVF.  She felt better, and it was felt that her SBO resolved.  Per surgery, her diet was advanced, and she was able to tolerate it.  She has BM.  Her coumadin was then resumed, however, it was felt that she has significant risks with her falling  tendency.  She has large contusion on her shoulder and anterior left chest after her last fall.  Risks (including catastrophic CVA) and benefits re-discussed with her and her husband, and they would like to discontinue the Coumadin.  In its place, ASA 81 mg and Plavix 75 mg per day along with PPI will be given.  She is anxious to go home, and is stable for discharge.  Her urine grew E coli, so she will be given 3 days of Ceftin 500mg  BID (allergy is rash to PCN).  CM will coordinate home health.  She will go home with her husband.  Recommendation for her to follow up with PCP in one week.  Thank you and Good Day.   Discharge Exam: Filed Vitals:   12/17/14 0643  BP: 164/79  Pulse: 87  Temp: 98.4 F (36.9 C)  Resp: 18      Discharge Instructions   Discharge Instructions    Diet - low sodium heart healthy    Complete by:  As directed      Discharge instructions    Complete by:  As directed   Follow up with your PCP next week is recommended.     Increase activity slowly    Complete by:  As directed           Current Discharge Medication List    START taking these medications   Details  aspirin EC 81 MG tablet Take 1 tablet (  81 mg total) by mouth daily. Qty: 100 tablet, Refills: 3      CONTINUE these medications which have NOT CHANGED   Details  atorvastatin (LIPITOR) 10 MG tablet take 1 tablet by mouth at bedtime Qty: 30 tablet, Refills: 11    CALCIUM-VITAMIN D PO Take 1 tablet by mouth daily.    clotrimazole-betamethasone (LOTRISONE) cream Apply 1 application topically daily as needed (for irritation).  Refills: 0    docusate sodium 100 MG CAPS Take 100 mg by mouth 2 (two) times daily. Qty: 10 capsule, Refills: 0    esomeprazole (NEXIUM) 20 MG capsule Take 20 mg by mouth daily before breakfast.    estazolam (PROSOM) 2 MG tablet Take 2 mg by mouth at bedtime.    metoprolol tartrate (LOPRESSOR) 25 MG tablet Take 12.5 mg by mouth daily.    MYRBETRIQ 25 MG TB24 Take 25  mg by mouth daily.    NITROSTAT 0.4 MG SL tablet Place 1 tablet (0.4 mg total) under the tongue every 5 (five) minutes as needed for chest pain. Qty: 25 tablet, Refills: 11    polyethylene glycol (MIRALAX / GLYCOLAX) packet Take 17 g by mouth daily. Qty: 14 each, Refills: 0    prazosin (MINIPRESS) 2 MG capsule Take 2 mg by mouth 2 (two) times daily.     sertraline (ZOLOFT) 50 MG tablet Take 50 mg by mouth daily. Refills: 1    trimethoprim (TRIMPEX) 100 MG tablet Take 100 mg by mouth daily.     ceftin 500mg  BID for 3 days.    STOP taking these medications     niacin 500 MG tablet      oxybutynin (DITROPAN-XL) 10 MG 24 hr tablet      oxyCODONE-acetaminophen (PERCOCET/ROXICET) 5-325 MG tablet      Potassium 99 MG TABS      traMADol-acetaminophen (ULTRACET) 37.5-325 MG per tablet      triamcinolone cream (KENALOG) 0.1 %      warfarin (COUMADIN) 3 MG tablet      warfarin (COUMADIN) 4 MG tablet        Allergies  Allergen Reactions  . Ciprofloxacin Anaphylaxis and Rash  . Codeine   . Contrast Media [Iodinated Diagnostic Agents]     Per pt chart.  . Iodine   . Lovenox [Enoxaparin] Other (See Comments)    Unknown reaction  . Nitrofurantoin Other (See Comments)    Numbness in arms and legs  . Nitrofurantoin Monohyd Macro Other (See Comments)  . Penicillins Rash  . Sulfa Antibiotics Rash  . Sulfonamide Derivatives Rash   Follow-up Information    Follow up with [facility].   Contact information:   [address] [place] [address] [phone]        The results of significant diagnostics from this hospitalization (including imaging, microbiology, ancillary and laboratory) are listed below for reference.    Significant Diagnostic Studies: Ct Abdomen Pelvis Wo Contrast  12/14/2014  CLINICAL DATA:  79 year old with hypotension. Abdominal pain and vomiting. EXAM: CT ABDOMEN AND PELVIS WITHOUT CONTRAST TECHNIQUE: Multidetector CT  imaging of the abdomen and pelvis was performed following the standard protocol without IV contrast. COMPARISON:  None. FINDINGS: Lower chest: Volume loss in the left lung related to elevated left hemidiaphragm. Hepatobiliary: No gross abnormality to the liver on this noncontrast examination. Difficult to know if the patient has a gallbladder due to fluid-filled loops of bowel in the right upper abdomen. Pancreas: No gross abnormality to pancreas on this noncontrast  examination. Spleen: Normal appearance of the spleen. Adrenals/Urinary Tract: Mild fullness of the right adrenal gland. No gross abnormality to the left adrenal gland. High-density structures along the medial cortex of left kidney, with Hounsfield units of 57. These could represent proteinaceous or hyperdense cysts but indeterminate. There is probably a low-density cyst along the left kidney lower pole but this is poorly characterized. Question a small cyst in the right kidney upper pole. No significant hydronephrosis in either kidney. There is fluid in the urinary bladder without gross abnormality. Stomach/Bowel: Marked elevation of the left hemidiaphragm. Distal stomach may be narrowed due to compression from the stomach and related to the elevated left hemidiaphragm. Fluid and gas filled dilated loops of small bowel throughout the abdomen. Small bowel loops measure 3.5 cm. The left side of the colon contains a large amount of stool but difficult to follow the course of the large bowel. Difficult to identify the cecum or terminal ileum based on the dilated loops of bowel. These findings are concerning for a bowel obstruction but the transition point is unclear. Vascular/Lymphatic: There is no significant lymphadenopathy. There is an infrarenal abdominal aortic aneurysm with calcified walls. The aneurysm measures up to 3.9 cm on sequence 2, image 43. Reproductive: Uterus is present without gross abnormality. Limited evaluation of the adnexal tissue.  Other: No evidence for free fluid. Musculoskeletal: Dextroscoliosis in the lumbar spine with multilevel facet disease. Grade 1 anterolisthesis at L4-L5 appears to secondary to facet disease. There is severe disc space loss at L2-L3 and L3-L4. IMPRESSION: Dilating loops of bowel, predominantly small bowel. Findings are suggestive for a bowel obstruction. Unclear if the obstruction is involving the small bowel or the colon. Limited evaluation of the right colon and transverse colon due to the lack of IV contrast and multiple dilated small bowel loops. Abdominal aortic aneurysm measuring up to 3.9 cm. No evidence to suggest an aortic rupture or complication. Indeterminate hypodense and hyperdense structures involving the kidneys. These probably represents cyst but indeterminate. This may be better characterized with an ultrasound. Multilevel degenerative disease in the lumbar spine. Electronically Signed   By: Markus Daft M.D.   On: 12/14/2014 15:59   Dg Ribs Unilateral W/chest Left  12/04/2014  CLINICAL DATA:  Fall today, LEFT rib pain. EXAM: LEFT RIBS AND CHEST - 3+ VIEW COMPARISON:  Radiograph 11/05/2013 FINDINGS: Cardiac silhouette is enlarged. This elevation LEFT hemidiaphragm. There is improved aeration at the LEFT lung base compared to comparison exam. No pneumothorax. Dedicated views of the LEFT ribs demonstrate no displaced fracture. IMPRESSION: No evidence of thoracic trauma or LEFT rib fracture. Improvement in aeration to the LEFT lung base. Electronically Signed   By: Suzy Bouchard M.D.   On: 12/04/2014 16:36   Dg Elbow Complete Left  12/04/2014  CLINICAL DATA:  Fall today, pain under left arm.  Left elbow pain. EXAM: LEFT ELBOW - COMPLETE 3+ VIEW COMPARISON:  None. FINDINGS: No acute bony abnormality. Specifically, no fracture, subluxation, or dislocation. Soft tissues are intact. IMPRESSION: No acute bony abnormality. Electronically Signed   By: Rolm Baptise M.D.   On: 12/04/2014 16:35   Dg  Chest Portable 1 View  12/14/2014  CLINICAL DATA:  Hypotension.  Abdominal pain.  Symptoms today. EXAM: PORTABLE CHEST 1 VIEW COMPARISON:  Single view of the chest 11/05/2013. FINDINGS: Marked asymmetric elevation of the left hemidiaphragm relative to the right is unchanged. The lungs are clear. Heart size is enlarged. No pneumothorax or pleural effusion. IMPRESSION: Cardiomegaly without acute disease.  Electronically Signed   By: Inge Rise M.D.   On: 12/14/2014 15:01   Dg Shoulder Left  12/04/2014  CLINICAL DATA:  79 year old female with acute left shoulder pain following fall yesterday. Initial encounter. EXAM: LEFT SHOULDER - 2+ VIEW COMPARISON:  11/05/2013 and prior chest radiographs. FINDINGS: There is no evidence of acute fracture, subluxation or dislocation. Degenerative changes at the [facility] and glenohumeral joints noted. No focal bony lesions are present. IMPRESSION: No evidence of bony abnormality. Degenerative changes of the [facility] and glenohumeral joints. Electronically Signed   By: Margarette Canada M.D.   On: 12/04/2014 15:19    Microbiology: Recent Results (from the past 240 hour(s))  Blood culture (routine x 2)     Status: None (Preliminary result)   Collection Time: 12/14/14  2:28 PM  Result Value Ref Range Status   Specimen Description BLOOD RIGHT HAND DRAWN BY RN  Final   Special Requests BOTTLES DRAWN AEROBIC ONLY 4CC  Final   Culture NO GROWTH 3 DAYS  Final   Report Status PENDING  Incomplete  Blood culture (routine x 2)     Status: None (Preliminary result)   Collection Time: 12/14/14  2:43 PM  Result Value Ref Range Status   Specimen Description BLOOD LEFT HAND  Final   Special Requests BOTTLES DRAWN AEROBIC AND ANAEROBIC [place]  Final   Culture NO GROWTH 3 DAYS  Final   Report Status PENDING  Incomplete  Urine culture     Status: None   Collection Time: 12/14/14  4:03 PM  Result Value Ref Range Status   Specimen Description URINE, CATHETERIZED  Final   Special Requests  NONE  Final   Culture   Final    >=100,000 COLONIES/mL ESCHERICHIA COLI Performed at [facility]    Report Status 12/17/2014 FINAL  Final   Organism ID, Bacteria ESCHERICHIA COLI  Final      Susceptibility   Escherichia coli - MIC*    AMPICILLIN >=32 RESISTANT Resistant     CEFAZOLIN 8 SENSITIVE Sensitive     CEFTRIAXONE <=1 SENSITIVE Sensitive     CIPROFLOXACIN <=0.25 SENSITIVE Sensitive     GENTAMICIN <=1 SENSITIVE Sensitive     IMIPENEM <=0.25 SENSITIVE Sensitive     NITROFURANTOIN <=16 SENSITIVE Sensitive     TRIMETH/SULFA >=320 RESISTANT Resistant     AMPICILLIN/SULBACTAM >=32 RESISTANT Resistant     PIP/TAZO <=4 SENSITIVE Sensitive     * >=100,000 COLONIES/mL ESCHERICHIA COLI  MRSA PCR Screening     Status: None   Collection Time: 12/14/14  5:30 PM  Result Value Ref Range Status   MRSA by PCR NEGATIVE NEGATIVE Final    Comment:        The GeneXpert MRSA Assay (FDA approved for NASAL specimens only), is one component of a comprehensive MRSA colonization surveillance program. It is not intended to diagnose MRSA infection nor to guide or monitor treatment for MRSA infections.      Labs: Basic Metabolic Panel:  Recent Labs Lab 12/14/14 1438 12/14/14 1445 12/15/14 0410  NA 133* 133* 136  K 3.9 3.8 3.5  CL 92* 93* 107  CO2 27  --  21*  GLUCOSE 115* 116* 81  BUN 27* 25* 23*  CREATININE 1.42* 1.50* 0.76  CALCIUM 10.2  --  8.6*   Liver Function Tests:  Recent Labs Lab 12/14/14 1438 12/15/14 0410  AST 29 25  ALT 20 16  ALKPHOS 81 63  BILITOT 1.3* 1.1  PROT 7.9 6.2*  ALBUMIN 4.4 3.5   No results for input(s): LIPASE, AMYLASE in the last 168 hours. No results for input(s): AMMONIA in the last 168 hours. CBC:  Recent Labs Lab 12/14/14 1438 12/14/14 1445 12/15/14 0410  WBC 9.1  --  7.5  NEUTROABS 7.5  --   --   HGB 15.3* 16.3* 13.3  HCT 43.9 48.0* 40.3  MCV 89.2  --  92.2  PLT 234  --  216   Cardiac Enzymes:  Recent Labs Lab  12/14/14 1438  TROPONINI 0.04*    Signed:  Jeanene Mena  [facility] 12/17/2014, 1:18 PM

## 2014-12-17 NOTE — Progress Notes (Signed)
Physical Therapy Treatment Patient Details Name: Andrea [name] MRN: [id number] DOB: [birth year] Today's Date: 12/17/2014    History of Present Illness This is a 79 year old lady who has aphasia from previous stroke and presents now with vomiting, 4 times in last 24 hours associated with constipation. She has a history of hypertension but was found to be hypotensive in the emergency room. According to the husband, who is at the bedside, she also had abdominal pain around her umbilicus. She appeared to have an umbilical hernia on presentation to the emergency room, which was reduced. Her symptoms seem to improve after this but CT scan suggests small bowel obstruction. She is now being admitted for further management. There is no fever, diarrhea    PT Comments    Pt was seen for completion of evaluation.  She was alert but very disoriented, stating that she wants to go home.  An attempt was made to transfer her from supine to sit with HOB elevated but she forcibly resisted by holding onto the bedrail and pulling her trunk back into extension.  She stated that she was too afraid of falling to sit up.  No amount of reassurance could change her mind.  She was returned to supine.  Pt was crying that she wanted to speak to her husband.  I phoned him and had an opportunity to speak to him.  She has essentially been total assist at home and her fear of falling is chronic.  In my opinion she has no rehab potential and should return home if her husband can continue to manage her.  He may very well need outside assistance in order to continue to care for her.    Follow Up Recommendations  No PT follow up     Equipment Recommendations  Hospital bed (if pt does not already have one)    Recommendations for Other Services  none     Precautions / Restrictions Precautions Precautions: Fall Restrictions Weight Bearing Restrictions: No    Mobility  Bed Mobility               General bed mobility  comments: attempt was made to transfer supine to sit but pt forceable resisted  by holding onto the bedrail...she stated that she was too afraid of falling.  Transfers                 General transfer comment: pt unwilling due to fear of falling  Ambulation/Gait             General Gait Details: non ambulatory   Stairs            Wheelchair Mobility    Modified Rankin (Stroke Patients Only)       Balance Overall balance assessment:  (unable to test)                                  Cognition Arousal/Alertness: Awake/alert Behavior During Therapy: WFL for tasks assessed/performed Overall Cognitive Status: History of cognitive impairments - at baseline                      Exercises      General Comments        Pertinent Vitals/Pain Pain Assessment: No/denies pain Pain Score: 0-No pain    Home Living  Prior Function            PT Goals (current goals can now be found in the care plan section) Progress towards PT goals: Goals met/education completed, patient discharged from PT    Frequency       PT Plan Discharge plan needs to be updated    Co-evaluation             End of Session   Activity Tolerance: Other (comment) (limited by fear of falling) Patient left: in bed;with call bell/phone within reach;with bed alarm set     Time: 4695-0722 PT Time Calculation (min) (ACUTE ONLY): 28 min  Charges:  $Therapeutic Activity: 8-22 mins                    G CodesSable Feil  PT 12/17/2014, 9:34 AM [phone]

## 2014-12-17 NOTE — Care Management Important Message (Signed)
Important Message  Patient Details  Name: Andrea [name] MRN: [id number] Date of Birth: 01/27/1936   Medicare Important Message Given:  Yes-second notification given    Joylene Draft, RN 12/17/2014, 1:05 PM

## 2014-12-17 NOTE — Progress Notes (Addendum)
[place] for Coumadin (chronic Rx PTA) Indication: atrial fibrillation  Allergies  Allergen Reactions  . Ciprofloxacin Anaphylaxis and Rash  . Codeine   . Contrast Media [Iodinated Diagnostic Agents]     Per pt chart.  . Iodine   . Lovenox [Enoxaparin] Other (See Comments)    Unknown reaction  . Nitrofurantoin Other (See Comments)    Numbness in arms and legs  . Nitrofurantoin Monohyd Macro Other (See Comments)  . Penicillins Rash  . Sulfa Antibiotics Rash  . Sulfonamide Derivatives Rash   Patient Measurements: Height: 5\' 2"  (157.5 cm) Weight: 103 lb 1.6 oz (46.766 kg) IBW/kg (Calculated) : 50.1  Vital Signs: Temp: 98.4 F (36.9 C) (10/27 0643) Temp Source: Oral (10/27 0643) BP: 164/79 mmHg (10/27 0643) Pulse Rate: 87 (10/27 0643)  Labs:  Recent Labs  12/14/14 1438 12/14/14 1445  12/15/14 0410 12/16/14 0629 12/17/14 0600  HGB 15.3* 16.3*  --  13.3  --   --   HCT 43.9 48.0*  --  40.3  --   --   PLT 234  --   --  216  --   --   LABPROT  --   --   < > 38.4* 35.1* 30.4*  INR  --   --   < > 4.05* 3.60* 2.98*  CREATININE 1.42* 1.50*  --  0.76  --   --   TROPONINI 0.04*  --   --   --   --   --   < > = values in this interval not displayed. Estimated Creatinine Clearance: 43.5 mL/min (by C-G formula based on Cr of 0.76).  Medical History: Past Medical History  Diagnosis Date  . Hypertension   . Stroke ([place])   . Acid reflux   . Atrial fibrillation (HCC)     tachybradycardia syndrome  . Tobacco abuse 04/30/2011  . Cardiomyopathy 05/02/2011    EF 45-50%, per Echo.  . 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 Carotid artery stenosis 05/02/2011  . CAD (coronary artery disease) 07/20/2006    R/S MV - mild perfusion defect in apical region consistent w/ infarct/scar vs apical thinning; no scintigraphic evidence of inducible myocardial ischemia; baseline EKG demonstrates A Flutter w/ variable AV conduction  . Hyperlipidemia    . Chest pain   . Cerebral atherosclerosis 12/11/2011    Carotid duplex - R ICA 0-49% diameter reduction, velocities suggest high end; L ICA 0-49% reduction, velocities suggest mid range  . Neuropathy ([place])   . Syncope   . Chronic anticoagulation 11/11/2013  . Orthostatic hypotension 11/11/2013  . Cancer [facility])     facial   Assessment: 80yo female with h/o stroke and afib.  On Chronic Coumadin at home. Patient alternates 3mg  on odd days and 4mg  on even days at home.(Last INR at Dr. Gerarda Fraction office 10/19 INR=2.4) Pt had supratherapeutic INR on admission of 4.05. Coumadin was on hold until INR<3. INR is 2.98 today. Will restart coumadin at 2.5mg . Patient otherwise stable.   Goal of Therapy:  INR 2-3 Monitor platelets by anticoagulation protocol: Yes   Plan:  Coumadin 2.5mg  today Check INR daily Monitor CBC and for s/sx bleeding  Isac Sarna, BS Vena Austria, BCPS Clinical Pharmacist Pager [phone]  12/17/2014,11:42 AM

## 2014-12-17 NOTE — Progress Notes (Signed)
Present with patient and her husband. They shared at length about their long time relationship-engaged in 1957 and after that engagement was broken and both had married others they reconnected in 2001, sometime later marrying.  We talked about how they had coped with her illness and were supported spiritually.

## 2014-12-17 NOTE — Care Management Note (Signed)
Case Management Note  Patient Details  Name: Andrea [name] MRN: [id number] Date of Birth: [birth year]  Subjective/Objective:                    Action/Plan:   Expected Discharge Date:                  Expected Discharge Plan:  [place]  In-House Referral:  NA  Discharge planning Services  CM Consult  Post Acute Care Choice:  Home Health Choice offered to:  Spouse, Patient  DME Arranged:    DME Agency:     HH Arranged:  RN, PT, Nurse's Aide [place] Agency:  [facility]  Status of Service:  Completed, signed off  Medicare Important Message Given:  Yes-second notification given Date Medicare IM Given:    Medicare IM give by:    Date Additional Medicare IM Given:    Additional Medicare Important Message give by:     If discussed at [place] of Stay Meetings, dates discussed:    Additional Comments: Pt discharged home today with [facility] RN, PT, and aide (per pts choice). Romualdo Bolk of [facility] is aware and will collect the pts information from the chart. [place] services to start within 48 hours of discharge. No DME needs noted at this time. Pt and pts nurse aware of discharge arrangements. Christinia Gully [place], RN 12/17/2014, 1:06 PM

## 2014-12-19 LAB — CULTURE, BLOOD (ROUTINE X 2)
CULTURE: NO GROWTH
CULTURE: NO GROWTH

## 2015-01-21 ENCOUNTER — Encounter (HOSPITAL_COMMUNITY): Payer: Self-pay | Admitting: Emergency Medicine

## 2015-01-21 ENCOUNTER — Emergency Department (HOSPITAL_COMMUNITY)
Admission: EM | Admit: 2015-01-21 | Discharge: 2015-01-21 | Disposition: A | Discharge status: Home / Self Care | Payer: Medicare Other | Attending: Emergency Medicine | Admitting: Emergency Medicine

## 2015-01-21 ENCOUNTER — Emergency Department (HOSPITAL_COMMUNITY): Payer: Medicare Other

## 2015-01-21 DIAGNOSIS — F17210 Nicotine dependence, cigarettes, uncomplicated: Secondary | ICD-10-CM | POA: Insufficient documentation

## 2015-01-21 DIAGNOSIS — Z7982 Long term (current) use of aspirin: Secondary | ICD-10-CM | POA: Diagnosis not present

## 2015-01-21 DIAGNOSIS — E785 Hyperlipidemia, unspecified: Secondary | ICD-10-CM | POA: Diagnosis not present

## 2015-01-21 DIAGNOSIS — Z859 Personal history of malignant neoplasm, unspecified: Secondary | ICD-10-CM | POA: Diagnosis not present

## 2015-01-21 DIAGNOSIS — Y92009 Unspecified place in unspecified non-institutional (private) residence as the place of occurrence of the external cause: Secondary | ICD-10-CM | POA: Diagnosis not present

## 2015-01-21 DIAGNOSIS — Y9389 Activity, other specified: Secondary | ICD-10-CM | POA: Insufficient documentation

## 2015-01-21 DIAGNOSIS — Y998 Other external cause status: Secondary | ICD-10-CM | POA: Insufficient documentation

## 2015-01-21 DIAGNOSIS — Z7901 Long term (current) use of anticoagulants: Secondary | ICD-10-CM | POA: Insufficient documentation

## 2015-01-21 DIAGNOSIS — Z792 Long term (current) use of antibiotics: Secondary | ICD-10-CM | POA: Diagnosis not present

## 2015-01-21 DIAGNOSIS — Z8673 Personal history of transient ischemic attack (TIA), and cerebral infarction without residual deficits: Secondary | ICD-10-CM | POA: Insufficient documentation

## 2015-01-21 DIAGNOSIS — W052XXA Fall from non-moving motorized mobility scooter, initial encounter: Secondary | ICD-10-CM | POA: Diagnosis not present

## 2015-01-21 DIAGNOSIS — K219 Gastro-esophageal reflux disease without esophagitis: Secondary | ICD-10-CM | POA: Diagnosis not present

## 2015-01-21 DIAGNOSIS — I10 Essential (primary) hypertension: Secondary | ICD-10-CM | POA: Insufficient documentation

## 2015-01-21 DIAGNOSIS — Z79899 Other long term (current) drug therapy: Secondary | ICD-10-CM | POA: Diagnosis not present

## 2015-01-21 DIAGNOSIS — W050XXA Fall from non-moving wheelchair, initial encounter: Secondary | ICD-10-CM

## 2015-01-21 DIAGNOSIS — I4891 Unspecified atrial fibrillation: Secondary | ICD-10-CM | POA: Insufficient documentation

## 2015-01-21 DIAGNOSIS — S0093XA Contusion of unspecified part of head, initial encounter: Secondary | ICD-10-CM

## 2015-01-21 DIAGNOSIS — W19XXXA Unspecified fall, initial encounter: Secondary | ICD-10-CM

## 2015-01-21 DIAGNOSIS — S0083XA Contusion of other part of head, initial encounter: Secondary | ICD-10-CM | POA: Insufficient documentation

## 2015-01-21 DIAGNOSIS — I2510 Atherosclerotic heart disease of native coronary artery without angina pectoris: Secondary | ICD-10-CM | POA: Diagnosis not present

## 2015-01-21 DIAGNOSIS — S51011A Laceration without foreign body of right elbow, initial encounter: Secondary | ICD-10-CM | POA: Diagnosis not present

## 2015-01-21 DIAGNOSIS — S0990XA Unspecified injury of head, initial encounter: Secondary | ICD-10-CM | POA: Diagnosis present

## 2015-01-21 NOTE — ED Notes (Signed)
Pt fell out of wheelchair while reaching for something. Pt struck her head and has skin tear to the rt elbow. Pt denies any loc.

## 2015-01-21 NOTE — ED Notes (Signed)
MD at bedside. 

## 2015-01-21 NOTE — ED Provider Notes (Signed)
CSN: [id number]     Arrival date & time 01/21/15  0009 History  By signing my name below, I, Altamease Oiler, attest that this documentation has been prepared under the direction and in the presence of Rolland Porter, MD at [phone]. Electronically Signed: Altamease Oiler, ED Scribe. 01/21/2015. 1:00 AM   Chief Complaint  Patient presents with  . Fall   The history is provided by the patient and the spouse. No language interpreter was used.   Andrea [name] is a 79 y.o. female with history of a-fib and stroke on Plavix who presents to the Emergency Department complaining of a fall last night around 11:30 PM (about 2 hrs ago). The pt fell from her wheelchair and struck the back of her head on the floor per husband. Associated symptoms include mild pain at the back of the head and a skin tear at the right elbow. Her husband denies any LOC and states that she is at her baseline mental status now.  Pt denies nausea, neck pain, and elbow pain.The patient's husband states that she has been wheelchair bound for the past 4 years since a stroke and that her legs have gotten more weak since her last hospitalization in October. He states she knows she shouldn't try to get out of the wheelchair without him assisting her.    Her primary care [facility] at [facility].  Past Medical History  Diagnosis Date  . Hypertension   . Stroke ([employer])   . Acid reflux   . Atrial fibrillation (HCC)     tachybradycardia syndrome  . Tobacco abuse 04/30/2011  . Cardiomyopathy 05/02/2011    EF 45-50%, per Echo.  . 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 Carotid artery stenosis 05/02/2011  . CAD (coronary artery disease) 07/20/2006    R/S MV - mild perfusion defect in apical region consistent w/ infarct/scar vs apical thinning; no scintigraphic evidence of inducible myocardial ischemia; baseline EKG demonstrates A Flutter w/ variable AV conduction  . Hyperlipidemia   . Chest pain   . Cerebral atherosclerosis 12/11/2011     Carotid duplex - R ICA 0-49% diameter reduction, velocities suggest high end; L ICA 0-49% reduction, velocities suggest mid range  . Neuropathy ([place])   . Syncope   . Chronic anticoagulation 11/11/2013  . Orthostatic hypotension 11/11/2013  . Cancer [facility])     facial   Past Surgical History  Procedure Laterality Date  . Cholecystectomy    . Skin cancer excision    . Tonsillectomy    . Tee with cardioversion      failed in past but dates not given   Family History  Problem Relation Age of Onset  . Adopted: Yes   Social History  Substance Use Topics  . Smoking status: Current Every Day Smoker -- 0.50 packs/day for 40 years    Types: Cigarettes  . Smokeless tobacco: Never Used  . Alcohol Use: No   Lives at home Lives with spouse Wheelchair bound  OB History    No data available     Review of Systems  Gastrointestinal: Negative for nausea.  Skin:       Skin tear at right elbow  Neurological: Positive for headaches. Negative for syncope.  All other systems reviewed and are negative.    Allergies  Ciprofloxacin; Codeine; Contrast media; Iodine; Lovenox; Nitrofurantoin; Nitrofurantoin monohyd macro; Penicillins; Sulfa antibiotics; and Sulfonamide derivatives  Home Medications   Prior to Admission medications   Medication Sig Start Date End  Date Taking? Authorizing Provider  aspirin EC 81 MG tablet Take 1 tablet (81 mg total) by mouth daily. 12/17/14   Orvan Falconer, MD  atorvastatin (LIPITOR) 10 MG tablet take 1 tablet by mouth at bedtime 05/21/14   Lorretta Harp, MD  CALCIUM-VITAMIN D PO Take 1 tablet by mouth daily.    Historical Provider, MD  cefUROXime (CEFTIN) 500 MG tablet Take 1 tablet (500 mg total) by mouth 2 (two) times daily with a meal. 12/17/14   Orvan Falconer, MD  clopidogrel (PLAVIX) 75 MG tablet Take 1 tablet (75 mg total) by mouth daily. 12/17/14   Orvan Falconer, MD  clotrimazole-betamethasone (LOTRISONE) cream Apply 1 application topically daily as needed (for  irritation).  02/10/14   Historical Provider, MD  docusate sodium 100 MG CAPS Take 100 mg by mouth 2 (two) times daily. Patient taking differently: Take 100 mg by mouth daily as needed (for constipation).  11/11/13   Isaiah Serge, NP  esomeprazole (NEXIUM) 20 MG capsule Take 20 mg by mouth daily before breakfast.    Historical Provider, MD  estazolam (PROSOM) 2 MG tablet Take 2 mg by mouth at bedtime.    Historical Provider, MD  metoprolol tartrate (LOPRESSOR) 25 MG tablet Take 12.5 mg by mouth daily.    Historical Provider, MD  MYRBETRIQ 25 MG TB24 Take 25 mg by mouth daily. 06/27/12   Historical Provider, MD  NITROSTAT 0.4 MG SL tablet Place 1 tablet (0.4 mg total) under the tongue every 5 (five) minutes as needed for chest pain. 05/13/14   Lorretta Harp, MD  polyethylene glycol [facility] Floria Raveling) packet Take 17 g by mouth daily. Patient taking differently: Take 17 g by mouth daily as needed.  11/11/13   Isaiah Serge, NP  prazosin (MINIPRESS) 2 MG capsule Take 2 mg by mouth 2 (two) times daily.     Historical Provider, MD  sertraline (ZOLOFT) 50 MG tablet Take 50 mg by mouth daily. 11/17/14   Historical Provider, MD  trimethoprim (TRIMPEX) 100 MG tablet Take 100 mg by mouth daily.  01/07/13   Historical Provider, MD   BP 122/59 mmHg  Pulse 80  Temp(Src) 97.9 F (36.6 C) (Oral)  Resp 24  Ht 5\' 2"  (1.575 m)  Wt 105 lb (47.628 kg)  BMI 19.20 kg/m2  SpO2 96% Physical Exam  Constitutional: She is oriented to person, place, and time. She appears well-developed and well-nourished.  Non-toxic appearance. She does not appear ill. No distress.  HENT:  Head: Normocephalic.    Right Ear: External ear normal.  Left Ear: External ear normal.  Nose: Nose normal. No mucosal edema or rhinorrhea.  Mouth/Throat: Oropharynx is clear and moist and mucous membranes are normal. No dental abscesses or uvula swelling.  Small hematoma to the upper scalp in the back  Eyes: Conjunctivae and EOM are  normal. Pupils are equal, round, and reactive to light.  Neck: Normal range of motion and full passive range of motion without pain. Neck supple.  Cardiovascular: Normal rate, regular rhythm and normal heart sounds.  Exam reveals no gallop and no friction rub.   No murmur heard. Pulmonary/Chest: Effort normal and breath sounds normal. No respiratory distress. She has no wheezes. She has no rhonchi. She has no rales. She exhibits no tenderness and no crepitus.  Abdominal: Soft. Normal appearance and bowel sounds are normal. She exhibits no distension. There is no tenderness. There is no rebound and no guarding.  Musculoskeletal: She exhibits no edema or  tenderness.   Small bruise at posterior right elbow with a very small skin tear.  Neurological: She is alert and oriented to person, place, and time. She has normal strength.  Expressive Dysarthria consistent with her history of stroke.  Skin: Skin is warm, dry and intact. No rash noted. No erythema. No pallor.  Psychiatric: She has a normal mood and affect. Her speech is normal and behavior is normal. Her mood appears not anxious.  Nursing note and vitals reviewed.   ED Course  Procedures (including critical care time)  DIAGNOSTIC STUDIES: Oxygen Saturation is 96% on RA,  normal by my interpretation.    COORDINATION OF CARE: 12:55 AM Discussed treatment plan which includes CT head and CT cervical spine with pt and her husband at bedside and they agreed to plan. Husband was given the option of taking her home and doing neuro checks without a CT scan done tonight or doing the CT scan however he would still have to do neuro checks during the night. He preferred to do the CT scan tonight.   Patient CT scan did not show any acute changes. She was discharged home with her husband. He is to watch her for the next 24 hours for any problems listed on the head injury sheet.  Labs Review Labs Reviewed - No data to display  Imaging Review Ct Head  Wo Contrast  Ct Cervical Spine Wo Contrast  01/21/2015  CLINICAL DATA:  79 year old female status post fall with trauma to the posterior head EXAM: CT HEAD WITHOUT CONTRAST CT CERVICAL SPINE WITHOUT CONTRAST TECHNIQUE: Multidetector CT imaging of the head and cervical spine was performed following the standard protocol without intravenous contrast. Multiplanar CT image reconstructions of the cervical spine were also generated. COMPARISON:  CT dated 11/05/2013 FINDINGS: CT HEAD FINDINGS The ventricles are dilated and the sulci are prominent compatible with age-related atrophy. Periventricular and deep white matter hypodensities represent chronic microvascular ischemic changes. Stable small focal left frontal cortical based old infarct and encephalomalacia noted. There is no intracranial hemorrhage. No mass effect or midline shift identified. The visualized paranasal sinuses and mastoid air cells are well aerated. The calvarium is intact. Small right posterior and left forehead scalp hematoma. CT CERVICAL SPINE FINDINGS There is no acute fracture or subluxation of the cervical spine.There multilevel degenerative changes and spurring. There is disc space narrowing at C5-C6 and C6-C7. There is mild narrowing of the central canal as well as mild narrowing of the neural foramina at C5-C6 and C6-C7 due to bony spurring and calcification of the posterior longitudinal ligament.The odontoid and spinous processes are intact.There is normal anatomic alignment of the C1-C2 lateral masses. The visualized soft tissues appear unremarkable. Multiple partially calcified thyroid nodules noted. Ultrasound is recommended for better evaluation of the thyroid gland. There bilateral carotid bulb atherosclerotic plaques. IMPRESSION: No acute intracranial pathology. Age-related atrophy and chronic microvascular ischemic disease. No acute/ traumatic cervical spine pathology. Electronically Signed   By: Anner Crete M.D.   On:  01/21/2015 02:06   I have personally reviewed and evaluated these images as part of my medical decision-making.    MDM   Final diagnoses:  Contusion of head, initial encounter  Fall from wheelchair, initial encounter  Fall at home, initial encounter   Plan discharge  Rolland Porter, MD, FACEP    I personally performed the services described in this documentation, which was scribed in my presence. The recorded information has been reviewed and considered.  Rolland Porter, MD, Abram Sander  Rolland Porter, MD 01/21/15 [phone]

## 2015-01-21 NOTE — Discharge Instructions (Signed)
You can put an ice pack to the contusion on the back of her head. Monitor her tonight and for the next 24 hrs for any signs listed on the head injury sheet and have her rechecked if she develops them immediately.    Cryotherapy Cryotherapy is when you put ice on your injury. Ice helps lessen pain and puffiness (swelling) after an injury. Ice works the best when you start using it in the first 24 to 48 hours after an injury. HOME CARE  Put a dry or damp towel between the ice pack and your skin.  You may press gently on the ice pack.  Leave the ice on for no more than 10 to 20 minutes at a time.  Check your skin after 5 minutes to make sure your skin is okay.  Rest at least 20 minutes between ice pack uses.  Stop using ice when your skin loses feeling (numbness).  Do not use ice on someone who cannot tell you when it hurts. This includes small children and people with memory problems (dementia). GET HELP RIGHT AWAY IF:  You have white spots on your skin.  Your skin turns blue or pale.  Your skin feels waxy or hard.  Your puffiness gets worse. MAKE SURE YOU:   Understand these instructions.  Will watch your condition.  Will get help right away if you are not doing well or get worse.   This information is not intended to replace advice given to you by your health care provider. Make sure you discuss any questions you have with your health care provider.   Document Released: 07/26/2007 Document Revised: 05/01/2011 Document Reviewed: 09/29/2010 Elsevier Interactive Patient Education 2016 [place] A contusion is a deep bruise. Contusions happen when an injury causes bleeding under the skin. Symptoms of bruising include pain, swelling, and discolored skin. The skin may turn blue, purple, or yellow. HOME CARE   Rest the injured area.  If told, put ice on the injured area.  Put ice in a plastic bag.  Place a towel between your skin and the bag.  Leave the  ice on for 20 minutes, 2-3 times per day.  If told, put light pressure (compression) on the injured area using an elastic bandage. Make sure the bandage is not too tight. Remove it and put it back on as told by your doctor.  If possible, raise (elevate) the injured area above the level of your heart while you are sitting or lying down.  Take over-the-counter and prescription medicines only as told by your doctor. GET HELP IF:  Your symptoms do not get better after several days of treatment.  Your symptoms get worse.  You have trouble moving the injured area. GET HELP RIGHT AWAY IF:   You have very bad pain.  You have a loss of feeling (numbness) in a hand or foot.  Your hand or foot turns pale or cold.   This information is not intended to replace advice given to you by your health care provider. Make sure you discuss any questions you have with your health care provider.   Document Released: 07/26/2007 Document Revised: 10/28/2014 Document Reviewed: 06/24/2014 Elsevier Interactive Patient Education 2016 [place] Injury, Adult You have a head injury. Headaches and throwing up (vomiting) are common after a head injury. It should be easy to wake up from sleeping. Sometimes you must stay in the hospital. Most problems happen within the first 24 hours. Side effects may  occur up to 7-10 days after the injury.  WHAT ARE THE TYPES OF HEAD INJURIES? Head injuries can be as minor as a bump. Some head injuries can be more severe. More severe head injuries include:  A jarring injury to the brain (concussion).  A bruise of the brain (contusion). This mean there is bleeding in the brain that can cause swelling.  A cracked skull (skull fracture).  Bleeding in the brain that collects, clots, and forms a bump (hematoma). WHEN SHOULD I GET HELP RIGHT AWAY?   You are confused or sleepy.  You cannot be woken up.  You feel sick to your stomach (nauseous) or keep throwing up  (vomiting).  Your dizziness or unsteadiness is getting worse.  You have very bad, lasting headaches that are not helped by medicine. Take medicines only as told by your doctor.  You cannot use your arms or legs like normal.  You cannot walk.  You notice changes in the black spots in the center of the colored part of your eye (pupil).  You have clear or bloody fluid coming from your nose or ears.  You have trouble seeing. During the next 24 hours after the injury, you must stay with someone who can watch you. This person should get help right away (call 911 in the [place] if you start to shake and are not able to control it (have seizures), you pass out, or you are unable to wake up. HOW CAN I PREVENT A HEAD INJURY IN THE FUTURE?  Wear seat belts.  Wear a helmet while bike riding and playing sports like football.  Stay away from dangerous activities around the house. WHEN CAN I RETURN TO NORMAL ACTIVITIES AND ATHLETICS? See your doctor before doing these activities. You should not do normal activities or play contact sports until 1 week after the following symptoms have stopped:  Headache that does not go away.  Dizziness.  Poor attention.  Confusion.  Memory problems.  Sickness to your stomach or throwing up.  Tiredness.  Fussiness.  Bothered by bright lights or loud noises.  Anxiousness or depression.  Restless sleep. MAKE SURE YOU:   Understand these instructions.  Will watch your condition.  Will get help right away if you are not doing well or get worse.   This information is not intended to replace advice given to you by your health care provider. Make sure you discuss any questions you have with your health care provider.   Document Released: 01/20/2008 Document Revised: 02/27/2014 Document Reviewed: 10/14/2012 Elsevier Interactive Patient Education [employer].

## 2015-02-03 ENCOUNTER — Ambulatory Visit (INDEPENDENT_AMBULATORY_CARE_PROVIDER_SITE_OTHER): Payer: Medicare Other | Admitting: Urology

## 2015-02-03 DIAGNOSIS — N3281 Overactive bladder: Secondary | ICD-10-CM | POA: Diagnosis not present

## 2015-02-03 DIAGNOSIS — N390 Urinary tract infection, site not specified: Secondary | ICD-10-CM | POA: Diagnosis not present

## 2015-02-03 DIAGNOSIS — N3020 Other chronic cystitis without hematuria: Secondary | ICD-10-CM

## 2015-02-03 DIAGNOSIS — N3941 Urge incontinence: Secondary | ICD-10-CM

## 2015-02-03 DIAGNOSIS — K5909 Other constipation: Secondary | ICD-10-CM

## 2015-03-19 ENCOUNTER — Inpatient Hospital Stay (HOSPITAL_COMMUNITY)
Admission: EM | Admit: 2015-03-19 | Discharge: 2015-03-23 | DRG: 689 | Disposition: A | Discharge status: Skilled Nursing Facility | Payer: Medicare Other | Attending: Internal Medicine | Admitting: Internal Medicine

## 2015-03-19 ENCOUNTER — Encounter (HOSPITAL_COMMUNITY): Payer: Self-pay | Admitting: Emergency Medicine

## 2015-03-19 ENCOUNTER — Emergency Department (HOSPITAL_COMMUNITY): Payer: Medicare Other

## 2015-03-19 DIAGNOSIS — Z7982 Long term (current) use of aspirin: Secondary | ICD-10-CM | POA: Diagnosis not present

## 2015-03-19 DIAGNOSIS — Z7902 Long term (current) use of antithrombotics/antiplatelets: Secondary | ICD-10-CM | POA: Diagnosis not present

## 2015-03-19 DIAGNOSIS — Z8673 Personal history of transient ischemic attack (TIA), and cerebral infarction without residual deficits: Secondary | ICD-10-CM

## 2015-03-19 DIAGNOSIS — I2510 Atherosclerotic heart disease of native coronary artery without angina pectoris: Secondary | ICD-10-CM | POA: Diagnosis present

## 2015-03-19 DIAGNOSIS — N390 Urinary tract infection, site not specified: Secondary | ICD-10-CM | POA: Diagnosis not present

## 2015-03-19 DIAGNOSIS — I482 Chronic atrial fibrillation, unspecified: Secondary | ICD-10-CM

## 2015-03-19 DIAGNOSIS — E43 Unspecified severe protein-calorie malnutrition: Secondary | ICD-10-CM | POA: Diagnosis present

## 2015-03-19 DIAGNOSIS — K219 Gastro-esophageal reflux disease without esophagitis: Secondary | ICD-10-CM | POA: Diagnosis not present

## 2015-03-19 DIAGNOSIS — F17210 Nicotine dependence, cigarettes, uncomplicated: Secondary | ICD-10-CM | POA: Diagnosis not present

## 2015-03-19 DIAGNOSIS — B961 Klebsiella pneumoniae [K. pneumoniae] as the cause of diseases classified elsewhere: Secondary | ICD-10-CM | POA: Diagnosis present

## 2015-03-19 DIAGNOSIS — R627 Adult failure to thrive: Secondary | ICD-10-CM | POA: Diagnosis present

## 2015-03-19 DIAGNOSIS — R413 Other amnesia: Secondary | ICD-10-CM

## 2015-03-19 DIAGNOSIS — Z79899 Other long term (current) drug therapy: Secondary | ICD-10-CM

## 2015-03-19 DIAGNOSIS — Z66 Do not resuscitate: Secondary | ICD-10-CM | POA: Diagnosis not present

## 2015-03-19 DIAGNOSIS — F329 Major depressive disorder, single episode, unspecified: Secondary | ICD-10-CM | POA: Diagnosis present

## 2015-03-19 DIAGNOSIS — E785 Hyperlipidemia, unspecified: Secondary | ICD-10-CM | POA: Diagnosis not present

## 2015-03-19 DIAGNOSIS — Z993 Dependence on wheelchair: Secondary | ICD-10-CM

## 2015-03-19 DIAGNOSIS — B9620 Unspecified Escherichia coli [E. coli] as the cause of diseases classified elsewhere: Secondary | ICD-10-CM | POA: Diagnosis present

## 2015-03-19 DIAGNOSIS — I10 Essential (primary) hypertension: Secondary | ICD-10-CM | POA: Diagnosis present

## 2015-03-19 DIAGNOSIS — Z681 Body mass index (BMI) 19 or less, adult: Secondary | ICD-10-CM | POA: Diagnosis not present

## 2015-03-19 DIAGNOSIS — E46 Unspecified protein-calorie malnutrition: Secondary | ICD-10-CM

## 2015-03-19 DIAGNOSIS — R296 Repeated falls: Secondary | ICD-10-CM | POA: Diagnosis present

## 2015-03-19 DIAGNOSIS — Z85828 Personal history of other malignant neoplasm of skin: Secondary | ICD-10-CM

## 2015-03-19 DIAGNOSIS — W19XXXA Unspecified fall, initial encounter: Secondary | ICD-10-CM

## 2015-03-19 DIAGNOSIS — Z7189 Other specified counseling: Secondary | ICD-10-CM | POA: Insufficient documentation

## 2015-03-19 DIAGNOSIS — Z515 Encounter for palliative care: Secondary | ICD-10-CM | POA: Insufficient documentation

## 2015-03-19 DIAGNOSIS — R531 Weakness: Secondary | ICD-10-CM

## 2015-03-19 DIAGNOSIS — K5900 Constipation, unspecified: Secondary | ICD-10-CM | POA: Diagnosis not present

## 2015-03-19 DIAGNOSIS — E876 Hypokalemia: Secondary | ICD-10-CM | POA: Diagnosis not present

## 2015-03-19 DIAGNOSIS — Z7901 Long term (current) use of anticoagulants: Secondary | ICD-10-CM

## 2015-03-19 DIAGNOSIS — R5381 Other malaise: Secondary | ICD-10-CM

## 2015-03-19 LAB — COMPREHENSIVE METABOLIC PANEL
ALBUMIN: 3.7 g/dL (ref 3.5–5.0)
ALT: 15 U/L (ref 14–54)
ANION GAP: 9 (ref 5–15)
AST: 24 U/L (ref 15–41)
Alkaline Phosphatase: 66 U/L (ref 38–126)
BILIRUBIN TOTAL: 1 mg/dL (ref 0.3–1.2)
BUN: 10 mg/dL (ref 6–20)
CHLORIDE: 102 mmol/L (ref 101–111)
CO2: 29 mmol/L (ref 22–32)
Calcium: 8.9 mg/dL (ref 8.9–10.3)
Creatinine, Ser: 0.46 mg/dL (ref 0.44–1.00)
GFR calc Af Amer: >60 mL/min (ref >60)
GLUCOSE: 109 mg/dL — AB (ref 65–99)
POTASSIUM: 3 mmol/L — AB (ref 3.5–5.1)
Sodium: 140 mmol/L (ref 135–145)
TOTAL PROTEIN: 6.4 g/dL — AB (ref 6.5–8.1)

## 2015-03-19 LAB — CBC WITH DIFFERENTIAL/PLATELET
BASOS ABS: 0 10*3/uL (ref 0.0–0.1)
BASOS PCT: 0 %
EOS PCT: 2 %
Eosinophils Absolute: 0.2 10*3/uL (ref 0.0–0.7)
HEMATOCRIT: 38.6 % (ref 36.0–46.0)
Hemoglobin: 13.1 g/dL (ref 12.0–15.0)
Lymphocytes Relative: 23 %
Lymphs Abs: 1.9 10*3/uL (ref 0.7–4.0)
MCH: 30.7 pg (ref 26.0–34.0)
MCHC: 33.9 g/dL (ref 30.0–36.0)
MCV: 90.4 fL (ref 78.0–100.0)
MONO ABS: 0.7 10*3/uL (ref 0.1–1.0)
MONOS PCT: 8 %
NEUTROS ABS: 5.7 10*3/uL (ref 1.7–7.7)
Neutrophils Relative %: 67 %
PLATELETS: 213 10*3/uL (ref 150–400)
RBC: 4.27 MIL/uL (ref 3.87–5.11)
RDW: 13.6 % (ref 11.5–15.5)
WBC: 8.4 10*3/uL (ref 4.0–10.5)

## 2015-03-19 LAB — URINE MICROSCOPIC-ADD ON

## 2015-03-19 LAB — URINALYSIS, ROUTINE W REFLEX MICROSCOPIC
Bilirubin Urine: NEGATIVE
Glucose, UA: NEGATIVE mg/dL
Hgb urine dipstick: NEGATIVE
Ketones, ur: NEGATIVE mg/dL
Nitrite: POSITIVE — AB
PROTEIN: NEGATIVE mg/dL
SPECIFIC GRAVITY, URINE: 1.02 (ref 1.005–1.030)
pH: 5.5 (ref 5.0–8.0)

## 2015-03-19 LAB — I-STAT TROPONIN, ED: TROPONIN I, POC: 0.02 ng/mL (ref 0.00–0.08)

## 2015-03-19 LAB — I-STAT CG4 LACTIC ACID, ED: LACTIC ACID, VENOUS: 1.03 mmol/L (ref 0.5–2.0)

## 2015-03-19 LAB — BRAIN NATRIURETIC PEPTIDE: B NATRIURETIC PEPTIDE 5: 302 pg/mL — AB (ref 0.0–100.0)

## 2015-03-19 MED ORDER — ATORVASTATIN CALCIUM 10 MG PO TABS
10.0000 mg | ORAL_TABLET | Freq: Every day | ORAL | Status: DC
  Administered 2015-03-20 – 2015-03-22 (×3): 10 mg via ORAL
  Filled 2015-03-19 (×3): qty 1

## 2015-03-19 MED ORDER — SERTRALINE HCL 50 MG PO TABS: 50.0000 mg | ORAL_TABLET | Freq: Every day | ORAL | Status: DC

## 2015-03-19 MED ORDER — PRAZOSIN HCL 1 MG PO CAPS
2.0000 mg | ORAL_CAPSULE | Freq: Two times a day (BID) | ORAL | Status: DC
  Administered 2015-03-20 – 2015-03-22 (×5): 2 mg via ORAL
  Filled 2015-03-19 (×2): qty 2
  Filled 2015-03-19 (×2): qty 1
  Filled 2015-03-19: qty 2
  Filled 2015-03-19: qty 1
  Filled 2015-03-19: qty 2
  Filled 2015-03-19 (×3): qty 1

## 2015-03-19 MED ORDER — POTASSIUM CHLORIDE CRYS ER 20 MEQ PO TBCR: 40.0000 meq | EXTENDED_RELEASE_TABLET | Freq: Once | ORAL | Status: DC

## 2015-03-19 MED ORDER — SENNA 8.6 MG PO TABS
2.0000 | ORAL_TABLET | Freq: Every day | ORAL | Status: DC
  Administered 2015-03-21 – 2015-03-22 (×2): 17.2 mg via ORAL
  Filled 2015-03-19 (×3): qty 2

## 2015-03-19 MED ORDER — SODIUM CHLORIDE 0.9 % IV BOLUS (SEPSIS)
1000.0000 mL | Freq: Once | INTRAVENOUS | Status: AC
  Administered 2015-03-19: 1000 mL via INTRAVENOUS

## 2015-03-19 MED ORDER — SODIUM CHLORIDE 0.9 % IV SOLN
INTRAVENOUS | Status: DC
  Administered 2015-03-19 – 2015-03-22 (×5): via INTRAVENOUS

## 2015-03-19 MED ORDER — PANTOPRAZOLE SODIUM 40 MG PO TBEC
80.0000 mg | DELAYED_RELEASE_TABLET | Freq: Every day | ORAL | Status: DC
  Administered 2015-03-21 – 2015-03-22 (×2): 80 mg via ORAL
  Filled 2015-03-19 (×3): qty 2

## 2015-03-19 MED ORDER — SERTRALINE HCL 50 MG PO TABS
50.0000 mg | ORAL_TABLET | Freq: Every day | ORAL | Status: DC
  Administered 2015-03-21 – 2015-03-23 (×3): 50 mg via ORAL
  Filled 2015-03-19 (×4): qty 1

## 2015-03-19 MED ORDER — ACETAMINOPHEN 650 MG RE SUPP: 650.0000 mg | Freq: Four times a day (QID) | RECTAL | Status: DC | PRN

## 2015-03-19 MED ORDER — SODIUM CHLORIDE 0.9 % IV SOLN: 250.0000 mL | INTRAVENOUS | Status: DC | PRN

## 2015-03-19 MED ORDER — ONDANSETRON HCL 4 MG/2ML IJ SOLN: 4.0000 mg | Freq: Four times a day (QID) | INTRAMUSCULAR | Status: DC | PRN

## 2015-03-19 MED ORDER — ENSURE ENLIVE PO LIQD
237.0000 mL | Freq: Two times a day (BID) | ORAL | Status: DC
  Administered 2015-03-21 – 2015-03-22 (×3): 237 mL via ORAL

## 2015-03-19 MED ORDER — POLYETHYLENE GLYCOL 3350 17 G PO PACK
17.0000 g | PACK | Freq: Every day | ORAL | Status: DC
  Administered 2015-03-19 – 2015-03-23 (×4): 17 g via ORAL
  Filled 2015-03-19 (×5): qty 1

## 2015-03-19 MED ORDER — SODIUM CHLORIDE 0.9% FLUSH
3.0000 mL | Freq: Two times a day (BID) | INTRAVENOUS | Status: DC
  Administered 2015-03-20 – 2015-03-22 (×4): 3 mL via INTRAVENOUS

## 2015-03-19 MED ORDER — METOPROLOL TARTRATE 25 MG PO TABS
12.5000 mg | ORAL_TABLET | Freq: Every day | ORAL | Status: DC
  Administered 2015-03-21 – 2015-03-23 (×3): 12.5 mg via ORAL
  Filled 2015-03-19 (×4): qty 1

## 2015-03-19 MED ORDER — CLOPIDOGREL BISULFATE 75 MG PO TABS: 75.0000 mg | ORAL_TABLET | Freq: Every day | ORAL | Status: DC

## 2015-03-19 MED ORDER — TEMAZEPAM 15 MG PO CAPS
15.0000 mg | ORAL_CAPSULE | Freq: Every evening | ORAL | Status: DC | PRN
  Administered 2015-03-20 – 2015-03-22 (×3): 15 mg via ORAL
  Filled 2015-03-19 (×3): qty 1

## 2015-03-19 MED ORDER — SODIUM CHLORIDE 0.9% FLUSH
3.0000 mL | Freq: Two times a day (BID) | INTRAVENOUS | Status: DC
  Administered 2015-03-20 – 2015-03-22 (×3): 3 mL via INTRAVENOUS

## 2015-03-19 MED ORDER — SODIUM CHLORIDE 0.9% FLUSH: 3.0000 mL | INTRAVENOUS | Status: DC | PRN

## 2015-03-19 MED ORDER — HEPARIN SODIUM (PORCINE) 5000 UNIT/ML IJ SOLN
5000.0000 [IU] | Freq: Three times a day (TID) | INTRAMUSCULAR | Status: DC
Start: 2015-03-19 — End: 2015-03-23
  Administered 2015-03-20 – 2015-03-23 (×10): 5000 [IU] via SUBCUTANEOUS
  Filled 2015-03-19 (×10): qty 1

## 2015-03-19 MED ORDER — DEXTROSE 5 % IV SOLN
1.0000 g | Freq: Once | INTRAVENOUS | Status: AC
  Administered 2015-03-19: 1 g via INTRAVENOUS
  Filled 2015-03-19: qty 10

## 2015-03-19 MED ORDER — ACETAMINOPHEN 325 MG PO TABS: 650.0000 mg | ORAL_TABLET | Freq: Four times a day (QID) | ORAL | Status: DC | PRN

## 2015-03-19 MED ORDER — ASPIRIN EC 81 MG PO TBEC
81.0000 mg | DELAYED_RELEASE_TABLET | Freq: Every day | ORAL | Status: DC
  Administered 2015-03-19 – 2015-03-23 (×4): 81 mg via ORAL
  Filled 2015-03-19 (×5): qty 1

## 2015-03-19 MED ORDER — CLOPIDOGREL BISULFATE 75 MG PO TABS
75.0000 mg | ORAL_TABLET | Freq: Every day | ORAL | Status: DC
  Administered 2015-03-21 – 2015-03-23 (×3): 75 mg via ORAL
  Filled 2015-03-19 (×4): qty 1

## 2015-03-19 MED ORDER — METOPROLOL TARTRATE 25 MG PO TABS: 12.5000 mg | ORAL_TABLET | Freq: Every day | ORAL | Status: DC

## 2015-03-19 MED ORDER — ONDANSETRON HCL 4 MG PO TABS: 4.0000 mg | ORAL_TABLET | Freq: Four times a day (QID) | ORAL | Status: DC | PRN

## 2015-03-19 MED ORDER — POTASSIUM CHLORIDE CRYS ER 20 MEQ PO TBCR
40.0000 meq | EXTENDED_RELEASE_TABLET | Freq: Once | ORAL | Status: AC
  Administered 2015-03-19: 40 meq via ORAL
  Filled 2015-03-19: qty 2

## 2015-03-19 MED ORDER — TETANUS-DIPHTH-ACELL PERTUSSIS 5-2.5-18.5 LF-MCG/0.5 IM SUSP
0.5000 mL | Freq: Once | INTRAMUSCULAR | Status: AC
  Administered 2015-03-19: 0.5 mL via INTRAMUSCULAR
  Filled 2015-03-19: qty 0.5

## 2015-03-19 MED ORDER — DEXTROSE 5 % IV SOLN
1.0000 g | INTRAVENOUS | Status: DC
  Administered 2015-03-20 – 2015-03-22 (×3): 1 g via INTRAVENOUS
  Filled 2015-03-19 (×4): qty 10

## 2015-03-19 NOTE — ED Notes (Signed)
Pt's husband called EMS for a fall with resulting laceration to left knee just prior to arrival.

## 2015-03-19 NOTE — Progress Notes (Signed)
1907 Called to get report on patient coming to room# 303, nurse busy & will call me back.

## 2015-03-19 NOTE — Progress Notes (Signed)
1952 Received call from pharmacist Seth Bake) @ [facility] regarding order for Lovenox for patient & patient has an allergy to Lovenox. Dr.Merrell notified and new orders given to d/c Lovenox and give Heparin 5,000 SQ TID. Seth Bake (pharmacist) notified and made aware of Dr.Merrell's orders and is calling Dr.Merrell to verify the Heparin order.

## 2015-03-19 NOTE — H&P (Signed)
[facility] History and Physical  Andrea [name] [id number] DOB: [birth year] DOA: 03/19/2015  Referring physician: Dr Roderic Palau PCP: Collene Mares, PA-C   Chief Complaint: General weakness  HPI: Andrea [name] is a 80 y.o. female  Level 5 caveat: pt unable to provide reliable history due to baseline confusion. History provided by pt and husband and EDP. Pt w/ gadual physical and mental decline over the past few months. Multiple recent falls. Husband unable to physically care for patient any longer. Patient was being treated with Trimpex for chronic UTIs but ran out of this medication in December. Patient is without complaint at this time but has eaten very little over the last 2-3 days which is unusual for her. Denies any chest pain, shortness breath, palpitations, dysuria, frequency, abdominal pain, fevers, nausea, vomiting. Symptoms are constant.   Patient at baseline smokes 1 pack per day but over the last couple days she has reduced her smoking to about one half pack per day. \  Review of Systems:  Unable to obtain further review systems due to patient's mental status.  Past Medical History  Diagnosis Date  . Hypertension   . Stroke ([place])   . Acid reflux   . Atrial fibrillation (HCC)     tachybradycardia syndrome  . Tobacco abuse 04/30/2011  . Cardiomyopathy 05/02/2011    EF 45-50%, per Echo.  . 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 Carotid artery stenosis 05/02/2011  . CAD (coronary artery disease) 07/20/2006    R/S MV - mild perfusion defect in apical region consistent w/ infarct/scar vs apical thinning; no scintigraphic evidence of inducible myocardial ischemia; baseline EKG demonstrates A Flutter w/ variable AV conduction  . Hyperlipidemia   . Chest pain   . Cerebral atherosclerosis 12/11/2011    Carotid duplex - R ICA 0-49% diameter reduction, velocities suggest high end; L ICA 0-49% reduction, velocities suggest mid range  . Neuropathy ([place])   .  Syncope   . Chronic anticoagulation 11/11/2013  . Orthostatic hypotension 11/11/2013  . Cancer [facility])     facial   Past Surgical History  Procedure Laterality Date  . Cholecystectomy    . Skin cancer excision    . Tonsillectomy    . Tee with cardioversion      failed in past but dates not given   Social History:  reports that she has been smoking Cigarettes.  She has a 20 pack-year smoking history. She has never used smokeless tobacco. She reports that she does not drink alcohol or use illicit drugs.  Allergies  Allergen Reactions  . Ciprofloxacin Anaphylaxis and Rash  . Codeine   . Contrast Media [Iodinated Diagnostic Agents]     Per pt chart.  . Iodine   . Lovenox [Enoxaparin] Other (See Comments)    Unknown reaction  . Nitrofurantoin Other (See Comments)    Numbness in arms and legs  . Nitrofurantoin Monohyd Macro Other (See Comments)  . Penicillins Rash  . Sulfa Antibiotics Rash  . Sulfonamide Derivatives Rash    Family History  Problem Relation Age of Onset  . Adopted: Yes     Prior to Admission medications   Medication Sig Start Date End Date Taking? Authorizing Provider  aspirin EC 81 MG tablet Take 1 tablet (81 mg total) by mouth daily. 12/17/14  Yes Orvan Falconer, MD  atorvastatin (LIPITOR) 10 MG tablet take 1 tablet by mouth at bedtime 05/21/14  Yes Lorretta Harp, MD  CALCIUM-VITAMIN D PO Take 1  tablet by mouth daily.   Yes Historical Provider, MD  clopidogrel (PLAVIX) 75 MG tablet Take 1 tablet (75 mg total) by mouth daily. 12/17/14  Yes Orvan Falconer, MD  clotrimazole-betamethasone (LOTRISONE) cream Apply 1 application topically daily as needed (for irritation).  02/10/14  Yes Historical Provider, MD  docusate sodium 100 MG CAPS Take 100 mg by mouth 2 (two) times daily. Patient taking differently: Take 100 mg by mouth daily as needed (for constipation).  11/11/13  Yes Isaiah Serge, NP  esomeprazole (NEXIUM) 20 MG capsule Take 20 mg by mouth daily before breakfast.    Yes Historical Provider, MD  estazolam (PROSOM) 2 MG tablet Take 2 mg by mouth at bedtime.   Yes Historical Provider, MD  metoprolol tartrate (LOPRESSOR) 25 MG tablet Take 12.5 mg by mouth daily.   Yes Historical Provider, MD  MYRBETRIQ 25 MG TB24 Take 50 mg by mouth daily.  06/27/12  Yes Historical Provider, MD  polyethylene glycol (MIRALAX / GLYCOLAX) packet Take 17 g by mouth daily. Patient taking differently: Take 17 g by mouth daily as needed.  11/11/13  Yes Isaiah Serge, NP  prazosin (MINIPRESS) 2 MG capsule Take 2 mg by mouth 2 (two) times daily.    Yes Historical Provider, MD  sertraline (ZOLOFT) 50 MG tablet Take 50 mg by mouth daily. 11/17/14  Yes Historical Provider, MD  trimethoprim (TRIMPEX) 100 MG tablet Take 100 mg by mouth daily.  01/07/13  Yes Historical Provider, MD  cefUROXime (CEFTIN) 500 MG tablet Take 1 tablet (500 mg total) by mouth 2 (two) times daily with a meal. Patient not taking: Reported on 03/19/2015 12/17/14   Orvan Falconer, MD  NITROSTAT 0.4 MG SL tablet Place 1 tablet (0.4 mg total) under the tongue every 5 (five) minutes as needed for chest pain. 05/13/14   Lorretta Harp, MD   Physical Exam: Filed Vitals:   03/19/15 1730 03/19/15 1800 03/19/15 1825 03/19/15 1830  BP: 141/96 144/131 147/80 153/129  Pulse:   107 49  Temp:      TempSrc:      Resp: 16 13 14 14   Height:      Weight:      SpO2:   98% 85%    Wt Readings from Last 3 Encounters:  03/19/15 40.824 kg (90 lb)  01/21/15 47.628 kg (105 lb)  12/15/14 46.766 kg (103 lb 1.6 oz)    General: Elderly and frail Eyes:  PERRL, EOMI, normal lids, iris ENT: Very dry mucous membranes Neck:  no LAD, masses or thyromegaly Cardiovascular:  RRR, no m/r/g. No LE edema.  Respiratory:  CTA bilaterally, no w/r/r. Normal respiratory effort. Abdomen:  soft, ntnd Skin:  no rash or induration seen on limited exam Musculoskeletal: Generalized weakness in all extremity's. No effusions. No bony upper maladies. Markedly  decreased tone in lower extremities bilaterally. Contractures noted of the right hand. Psychiatric: Patient follows basic commands alert and oriented 0. Neurologic:  CN 2-12 grossly intact, moves all extremities in coordinated fashion.          Labs on Admission:  Basic Metabolic Panel:  Recent Labs Lab 03/19/15 1531  NA 140  K 3.0*  CL 102  CO2 29  GLUCOSE 109*  BUN 10  CREATININE 0.46  CALCIUM 8.9   Liver Function Tests:  Recent Labs Lab 03/19/15 1531  AST 24  ALT 15  ALKPHOS 66  BILITOT 1.0  PROT 6.4*  ALBUMIN 3.7   No results for input(s): LIPASE, AMYLASE  in the last 168 hours. No results for input(s): AMMONIA in the last 168 hours. CBC:  Recent Labs Lab 03/19/15 1531  WBC 8.4  NEUTROABS 5.7  HGB 13.1  HCT 38.6  MCV 90.4  PLT 213   Cardiac Enzymes: No results for input(s): CKTOTAL, CKMB, CKMBINDEX, TROPONINI in the last 168 hours.  BNP (last 3 results)  Recent Labs  03/19/15 1531  BNP 302.0*    ProBNP (last 3 results) No results for input(s): PROBNP in the last 8760 hours.   CREATININE: 0.46 (03/19/15 1531) Estimated creatinine clearance - 37.9 mL/min  CBG: No results for input(s): GLUCAP in the last 168 hours.  Radiological Exams on Admission: Dg Chest 1 View  03/19/2015  CLINICAL DATA:  Fall. EXAM: CHEST 1 VIEW COMPARISON:  12/14/2014. FINDINGS: Lungs are hyperexpanded with asymmetric elevation of the left hemidiaphragm. The cardio pericardial silhouette is enlarged. The lungs are clear wiithout focal pneumonia, edema, pneumothorax or pleural effusion. Interstitial markings are diffusely coarsened with chronic features. 15 mm nodular density seen in the region of the left hilum. Telemetry leads overlie the chest. Bones are diffusely demineralized. IMPRESSION: 15 mm left hilar nodule may be related to lymphadenopathy or central lesion. CT chest with contrast recommended to further evaluate. Hyperexpansion with chronic underlying interstitial  lung disease. Electronically Signed   By: Misty Stanley M.D.   On: 03/19/2015 17:35   Ct Knee Left Wo Contrast  03/19/2015  CLINICAL DATA:  Recent fall with laceration EXAM: CT OF THE left KNEE WITHOUT CONTRAST TECHNIQUE: Multidetector CT imaging of the left knee was performed according to the standard protocol. Multiplanar CT image reconstructions were also generated. COMPARISON:  None. FINDINGS: There are changes consistent with prior healed fracture of the proximal fibula. No acute fracture or dislocation is identified. Mild osteopenia is noted. No significant joint effusion is noted. The cruciate ligaments appear intact. No focal hematoma is noted. No radiopaque foreign body is seen. Diffuse vascular calcifications are noted. IMPRESSION: No acute abnormality is seen. Electronically Signed   By: Inez Catalina M.D.   On: 03/19/2015 17:44     Assessment/Plan Active Problems:   UTI (lower urinary tract infection)   Essential hypertension   Hyperlipidemia   Memory loss   Physical deconditioning   Protein calorie malnutrition (HCC)   Chronic a-fib (HCC)   History of CVA (cerebrovascular accident)  Physical deconditioning: Pt and husband unable to continue ADLs at home. Suspect nml changes w/ age compounded by poor nutrition and UTI and undiagnosed dementia. Suspect SNF placement and need for end of life discussions ??hospice?? - Tele - PT/Ot - nutrition consult - pre-albumin - CSW - ensure - palliative care consult - MMSE after discharge - suspect undiagnosed dementia  UTI: UA suggestive of infection - CTX - f/u UCX and BCX - hold myrbetriq - IVF - Continue home prazosin  Afib: rate controlled. No anticoagulation due to fall risk - continue bblocker  H//O CVA: - continue ASA/Plavix  HTN: - continue metoprolol  Constipation: chronic - miralax and senokot  HLD: - continue statin  Depression: - continue zoloft      Code Status: DNR  DVT Prophylaxis: hep Family  Communication: husband Disposition Plan: Pending Improvement    MERRELL, DAVID J, MD Family Medicine [facility] [email or web address] Password [email or web address]

## 2015-03-19 NOTE — ED Provider Notes (Signed)
CSN: [id number]     Arrival date & time 03/19/15  1414 History   First MD Initiated Contact with Patient 03/19/15 1504     Chief Complaint  Patient presents with  . Fall     (Consider location/radiation/quality/duration/timing/severity/associated sxs/prior Treatment) Patient is a 80 y.o. female presenting with fall. The history is provided by the spouse (Patient has had increasing weakness over the last few days. She fell on her left knee today.).  Fall This is a new problem. The current episode started 12 to 24 hours ago. The problem occurs rarely. The problem has been resolved. Associated symptoms include abdominal pain. Pertinent negatives include no chest pain and no headaches. Exacerbated by: Movement of left knee. Nothing relieves the symptoms.    Past Medical History  Diagnosis Date  . Hypertension   . Stroke ([place])   . Acid reflux   . Atrial fibrillation (HCC)     tachybradycardia syndrome  . Tobacco abuse 04/30/2011  . Cardiomyopathy 05/02/2011    EF 45-50%, per Echo.  . 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 Carotid artery stenosis 05/02/2011  . CAD (coronary artery disease) 07/20/2006    R/S MV - mild perfusion defect in apical region consistent w/ infarct/scar vs apical thinning; no scintigraphic evidence of inducible myocardial ischemia; baseline EKG demonstrates A Flutter w/ variable AV conduction  . Hyperlipidemia   . Chest pain   . Cerebral atherosclerosis 12/11/2011    Carotid duplex - R ICA 0-49% diameter reduction, velocities suggest high end; L ICA 0-49% reduction, velocities suggest mid range  . Neuropathy ([place])   . Syncope   . Chronic anticoagulation 11/11/2013  . Orthostatic hypotension 11/11/2013  . Cancer [facility])     facial   Past Surgical History  Procedure Laterality Date  . Cholecystectomy    . Skin cancer excision    . Tonsillectomy    . Tee with cardioversion      failed in past but dates not given   Family History  Problem Relation Age of  Onset  . Adopted: Yes   Social History  Substance Use Topics  . Smoking status: Current Every Day Smoker -- 0.50 packs/day for 40 years    Types: Cigarettes  . Smokeless tobacco: Never Used  . Alcohol Use: No   OB History    No data available     Review of Systems  Constitutional: Positive for fatigue. Negative for appetite change.  HENT: Negative for congestion, ear discharge and sinus pressure.   Eyes: Negative for discharge.  Respiratory: Negative for cough.   Cardiovascular: Negative for chest pain.  Gastrointestinal: Positive for abdominal pain. Negative for diarrhea.  Genitourinary: Negative for frequency and hematuria.  Musculoskeletal: Negative for back pain.       Pain and swelling left knee  Skin: Negative for rash.  Neurological: Negative for seizures and headaches.  Psychiatric/Behavioral: Negative for hallucinations.      Allergies  Ciprofloxacin; Codeine; Contrast media; Iodine; Lovenox; Nitrofurantoin; Nitrofurantoin monohyd macro; Penicillins; Sulfa antibiotics; and Sulfonamide derivatives  Home Medications   Prior to Admission medications   Medication Sig Start Date End Date Taking? Authorizing Provider  aspirin EC 81 MG tablet Take 1 tablet (81 mg total) by mouth daily. 12/17/14  Yes Orvan Falconer, MD  atorvastatin (LIPITOR) 10 MG tablet take 1 tablet by mouth at bedtime 05/21/14  Yes Lorretta Harp, MD  CALCIUM-VITAMIN D PO Take 1 tablet by mouth daily.   Yes Historical Provider, MD  clopidogrel (PLAVIX) 75 MG tablet Take 1 tablet (75 mg total) by mouth daily. 12/17/14  Yes Orvan Falconer, MD  clotrimazole-betamethasone (LOTRISONE) cream Apply 1 application topically daily as needed (for irritation).  02/10/14  Yes Historical Provider, MD  docusate sodium 100 MG CAPS Take 100 mg by mouth 2 (two) times daily. Patient taking differently: Take 100 mg by mouth daily as needed (for constipation).  11/11/13  Yes Isaiah Serge, NP  esomeprazole (NEXIUM) 20 MG capsule  Take 20 mg by mouth daily before breakfast.   Yes Historical Provider, MD  estazolam (PROSOM) 2 MG tablet Take 2 mg by mouth at bedtime.   Yes Historical Provider, MD  metoprolol tartrate (LOPRESSOR) 25 MG tablet Take 12.5 mg by mouth daily.   Yes Historical Provider, MD  MYRBETRIQ 25 MG TB24 Take 50 mg by mouth daily.  06/27/12  Yes Historical Provider, MD  polyethylene glycol (MIRALAX / GLYCOLAX) packet Take 17 g by mouth daily. Patient taking differently: Take 17 g by mouth daily as needed.  11/11/13  Yes Isaiah Serge, NP  prazosin (MINIPRESS) 2 MG capsule Take 2 mg by mouth 2 (two) times daily.    Yes Historical Provider, MD  sertraline (ZOLOFT) 50 MG tablet Take 50 mg by mouth daily. 11/17/14  Yes Historical Provider, MD  trimethoprim (TRIMPEX) 100 MG tablet Take 100 mg by mouth daily.  01/07/13  Yes Historical Provider, MD  cefUROXime (CEFTIN) 500 MG tablet Take 1 tablet (500 mg total) by mouth 2 (two) times daily with a meal. Patient not taking: Reported on 03/19/2015 12/17/14   Orvan Falconer, MD  NITROSTAT 0.4 MG SL tablet Place 1 tablet (0.4 mg total) under the tongue every 5 (five) minutes as needed for chest pain. 05/13/14   Lorretta Harp, MD   BP 147/80 mmHg  Pulse 107  Temp(Src) 97.9 F (36.6 C) (Oral)  Resp 14  Ht 5\' 2"  (1.575 m)  Wt 90 lb (40.824 kg)  BMI 16.46 kg/m2  SpO2 98% Physical Exam  Constitutional:  Cachectic  HENT:  Head: Normocephalic.  Eyes: Conjunctivae and EOM are normal. No scleral icterus.  Neck: Neck supple. No thyromegaly present.  Cardiovascular: Normal rate and regular rhythm.  Exam reveals no gallop and no friction rub.   No murmur heard. Pulmonary/Chest: No stridor. She has no wheezes. She has no rales. She exhibits no tenderness.  Abdominal: She exhibits no distension. There is no tenderness. There is no rebound.  Musculoskeletal: She exhibits tenderness. She exhibits no edema.  Patient has abrasion to left knee with some swelling tenderness   Lymphadenopathy:    She has no cervical adenopathy.  Neurological: She is alert. She exhibits normal muscle tone. Coordination normal.  Patient oriented to person and place  Skin: No rash noted. No erythema.  Psychiatric: She has a normal mood and affect. Her behavior is normal.    ED Course  Procedures (including critical care time) Labs Review Labs Reviewed  COMPREHENSIVE METABOLIC PANEL - Abnormal; Notable for the following:    Potassium 3.0 (*)    Glucose, Bld 109 (*)    Total Protein 6.4 (*)    All other components within normal limits  URINALYSIS, ROUTINE W REFLEX MICROSCOPIC (NOT AT [facility]) - Abnormal; Notable for the following:    APPearance HAZY (*)    Nitrite POSITIVE (*)    Leukocytes, UA SMALL (*)    All other components within normal limits  BRAIN NATRIURETIC PEPTIDE - Abnormal; Notable for the following:  B Natriuretic Peptide 302.0 (*)    All other components within normal limits  URINE MICROSCOPIC-ADD ON - Abnormal; Notable for the following:    Squamous Epithelial / LPF 0-5 (*)    Bacteria, UA MANY (*)    All other components within normal limits  URINE CULTURE  CBC WITH DIFFERENTIAL/PLATELET  I-STAT CG4 LACTIC ACID, ED  Randolm Idol, ED    Imaging Review Dg Chest 1 View  03/19/2015  CLINICAL DATA:  Fall. EXAM: CHEST 1 VIEW COMPARISON:  12/14/2014. FINDINGS: Lungs are hyperexpanded with asymmetric elevation of the left hemidiaphragm. The cardio pericardial silhouette is enlarged. The lungs are clear wiithout focal pneumonia, edema, pneumothorax or pleural effusion. Interstitial markings are diffusely coarsened with chronic features. 15 mm nodular density seen in the region of the left hilum. Telemetry leads overlie the chest. Bones are diffusely demineralized. IMPRESSION: 15 mm left hilar nodule may be related to lymphadenopathy or central lesion. CT chest with contrast recommended to further evaluate. Hyperexpansion with chronic underlying interstitial  lung disease. Electronically Signed   By: Misty Stanley M.D.   On: 03/19/2015 17:35   Ct Knee Left Wo Contrast  03/19/2015  CLINICAL DATA:  Recent fall with laceration EXAM: CT OF THE left KNEE WITHOUT CONTRAST TECHNIQUE: Multidetector CT imaging of the left knee was performed according to the standard protocol. Multiplanar CT image reconstructions were also generated. COMPARISON:  None. FINDINGS: There are changes consistent with prior healed fracture of the proximal fibula. No acute fracture or dislocation is identified. Mild osteopenia is noted. No significant joint effusion is noted. The cruciate ligaments appear intact. No focal hematoma is noted. No radiopaque foreign body is seen. Diffuse vascular calcifications are noted. IMPRESSION: No acute abnormality is seen. Electronically Signed   By: Inez Catalina M.D.   On: 03/19/2015 17:44   I have personally reviewed and evaluated these images and lab results as part of my medical decision-making.   EKG Interpretation None      MDM   Final diagnoses:  UTI (lower urinary tract infection)  Weakness    Patient will be admitted for urinary tract infection and weakness   Milton Ferguson, MD 03/19/15 1831

## 2015-03-19 NOTE — ED Notes (Signed)
Report given to Crystal on Dept 300. All questions answered.

## 2015-03-20 DIAGNOSIS — Z85828 Personal history of other malignant neoplasm of skin: Secondary | ICD-10-CM | POA: Diagnosis not present

## 2015-03-20 DIAGNOSIS — I2510 Atherosclerotic heart disease of native coronary artery without angina pectoris: Secondary | ICD-10-CM | POA: Diagnosis present

## 2015-03-20 DIAGNOSIS — Z79899 Other long term (current) drug therapy: Secondary | ICD-10-CM | POA: Diagnosis not present

## 2015-03-20 DIAGNOSIS — Z515 Encounter for palliative care: Secondary | ICD-10-CM | POA: Diagnosis not present

## 2015-03-20 DIAGNOSIS — Z993 Dependence on wheelchair: Secondary | ICD-10-CM | POA: Diagnosis not present

## 2015-03-20 DIAGNOSIS — N390 Urinary tract infection, site not specified: Secondary | ICD-10-CM | POA: Diagnosis present

## 2015-03-20 DIAGNOSIS — R413 Other amnesia: Secondary | ICD-10-CM | POA: Diagnosis not present

## 2015-03-20 DIAGNOSIS — Z8673 Personal history of transient ischemic attack (TIA), and cerebral infarction without residual deficits: Secondary | ICD-10-CM | POA: Diagnosis not present

## 2015-03-20 DIAGNOSIS — R296 Repeated falls: Secondary | ICD-10-CM | POA: Diagnosis present

## 2015-03-20 DIAGNOSIS — E785 Hyperlipidemia, unspecified: Secondary | ICD-10-CM | POA: Diagnosis not present

## 2015-03-20 DIAGNOSIS — F329 Major depressive disorder, single episode, unspecified: Secondary | ICD-10-CM | POA: Diagnosis present

## 2015-03-20 DIAGNOSIS — B9620 Unspecified Escherichia coli [E. coli] as the cause of diseases classified elsewhere: Secondary | ICD-10-CM | POA: Diagnosis present

## 2015-03-20 DIAGNOSIS — E43 Unspecified severe protein-calorie malnutrition: Secondary | ICD-10-CM | POA: Diagnosis present

## 2015-03-20 DIAGNOSIS — K219 Gastro-esophageal reflux disease without esophagitis: Secondary | ICD-10-CM | POA: Diagnosis present

## 2015-03-20 DIAGNOSIS — Z7982 Long term (current) use of aspirin: Secondary | ICD-10-CM | POA: Diagnosis not present

## 2015-03-20 DIAGNOSIS — K5900 Constipation, unspecified: Secondary | ICD-10-CM | POA: Diagnosis present

## 2015-03-20 DIAGNOSIS — Z7901 Long term (current) use of anticoagulants: Secondary | ICD-10-CM | POA: Diagnosis not present

## 2015-03-20 DIAGNOSIS — R627 Adult failure to thrive: Secondary | ICD-10-CM | POA: Diagnosis present

## 2015-03-20 DIAGNOSIS — I482 Chronic atrial fibrillation: Secondary | ICD-10-CM | POA: Diagnosis not present

## 2015-03-20 DIAGNOSIS — Z7902 Long term (current) use of antithrombotics/antiplatelets: Secondary | ICD-10-CM | POA: Diagnosis not present

## 2015-03-20 DIAGNOSIS — Z66 Do not resuscitate: Secondary | ICD-10-CM | POA: Diagnosis present

## 2015-03-20 DIAGNOSIS — I10 Essential (primary) hypertension: Secondary | ICD-10-CM

## 2015-03-20 DIAGNOSIS — R5381 Other malaise: Secondary | ICD-10-CM | POA: Diagnosis not present

## 2015-03-20 DIAGNOSIS — E876 Hypokalemia: Secondary | ICD-10-CM | POA: Diagnosis present

## 2015-03-20 DIAGNOSIS — B961 Klebsiella pneumoniae [K. pneumoniae] as the cause of diseases classified elsewhere: Secondary | ICD-10-CM | POA: Diagnosis present

## 2015-03-20 DIAGNOSIS — F17210 Nicotine dependence, cigarettes, uncomplicated: Secondary | ICD-10-CM | POA: Diagnosis present

## 2015-03-20 DIAGNOSIS — Z681 Body mass index (BMI) 19 or less, adult: Secondary | ICD-10-CM | POA: Diagnosis not present

## 2015-03-20 DIAGNOSIS — R531 Weakness: Secondary | ICD-10-CM | POA: Diagnosis present

## 2015-03-20 DIAGNOSIS — E46 Unspecified protein-calorie malnutrition: Secondary | ICD-10-CM | POA: Diagnosis not present

## 2015-03-20 LAB — COMPREHENSIVE METABOLIC PANEL
ALT: 15 U/L (ref 14–54)
ANION GAP: 13 (ref 5–15)
AST: 24 U/L (ref 15–41)
Albumin: 3.7 g/dL (ref 3.5–5.0)
Alkaline Phosphatase: 66 U/L (ref 38–126)
BILIRUBIN TOTAL: 1.4 mg/dL — AB (ref 0.3–1.2)
BUN: 11 mg/dL (ref 6–20)
CHLORIDE: 102 mmol/L (ref 101–111)
CO2: 25 mmol/L (ref 22–32)
Calcium: 8.6 mg/dL — ABNORMAL LOW (ref 8.9–10.3)
Creatinine, Ser: 0.51 mg/dL (ref 0.44–1.00)
Glucose, Bld: 102 mg/dL — ABNORMAL HIGH (ref 65–99)
POTASSIUM: 2.9 mmol/L — AB (ref 3.5–5.1)
Sodium: 140 mmol/L (ref 135–145)
TOTAL PROTEIN: 6.5 g/dL (ref 6.5–8.1)

## 2015-03-20 LAB — CBC
HEMATOCRIT: 39.3 % (ref 36.0–46.0)
HEMOGLOBIN: 13.2 g/dL (ref 12.0–15.0)
MCH: 30.4 pg (ref 26.0–34.0)
MCHC: 33.6 g/dL (ref 30.0–36.0)
MCV: 90.6 fL (ref 78.0–100.0)
PLATELETS: 222 10*3/uL (ref 150–400)
RBC: 4.34 MIL/uL (ref 3.87–5.11)
RDW: 13.8 % (ref 11.5–15.5)
WBC: 9 10*3/uL (ref 4.0–10.5)

## 2015-03-20 LAB — MAGNESIUM: MAGNESIUM: 1.2 mg/dL — AB (ref 1.7–2.4)

## 2015-03-20 LAB — PREALBUMIN: PREALBUMIN: 9.3 mg/dL — AB (ref 18–38)

## 2015-03-20 LAB — VITAMIN B12: VITAMIN B 12: 232 pg/mL (ref 180–914)

## 2015-03-20 LAB — TSH: TSH: 2.5 u[IU]/mL (ref 0.350–4.500)

## 2015-03-20 IMAGING — CT CT HEAD W/O CM
1 series · 16 of 30 positions shown, 20 images · non-contrast
Comparison: 04/29/2011.

CLINICAL DATA: Syncope.

CT HEAD WITHOUT CONTRAST
TECHNIQUE: Contiguous axial images were obtained from the base of
the skull through the vertex without contrast.

[Series 2: headseq 4.8 h37s · axial · 0.43mm/px · z∈[+135,+292]mm · 16 of 36 slices shown, 20 images]
[im 2/36  brain]
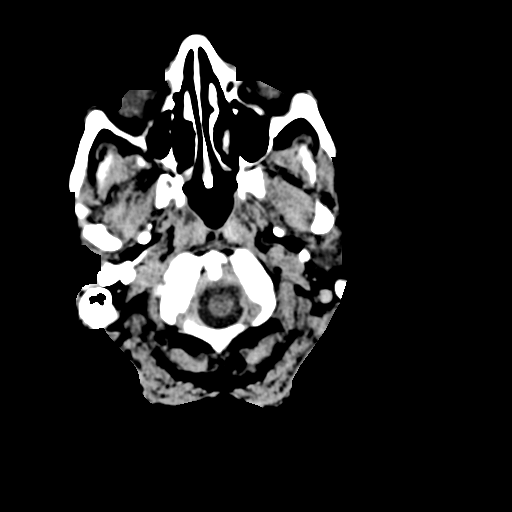
[im 2/36  bone]
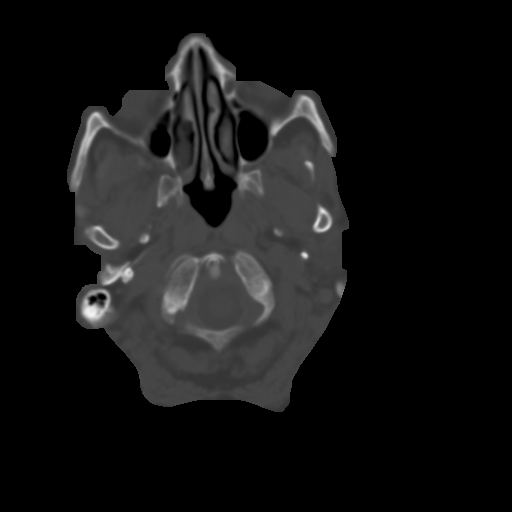
[im 4/36  brain]
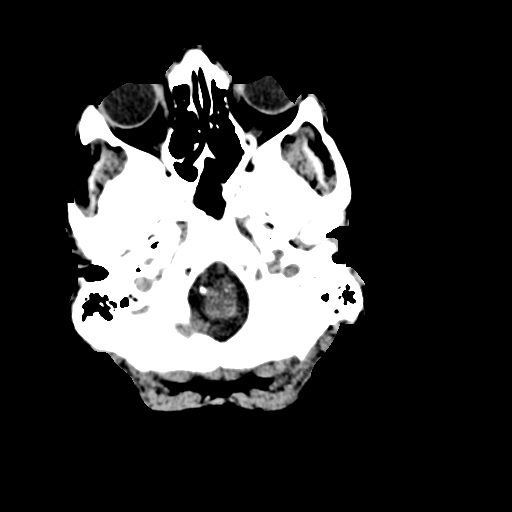
[im 7/36  brain]
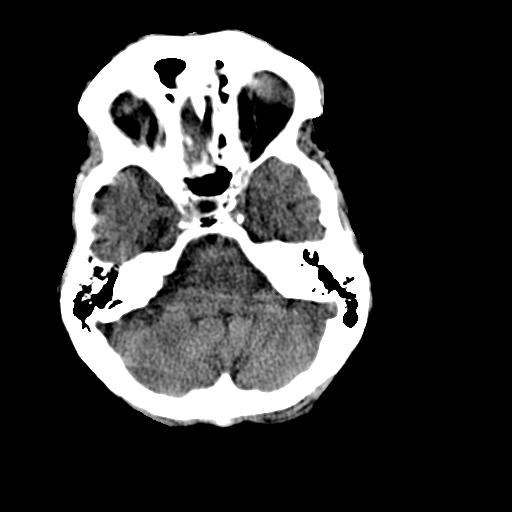
[im 9/36  brain]
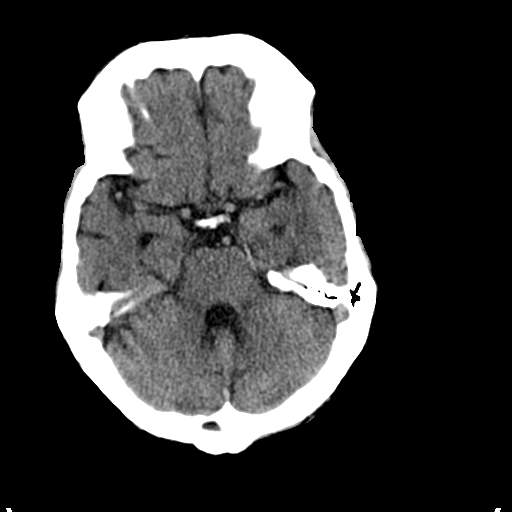
[im 10/36  brain]
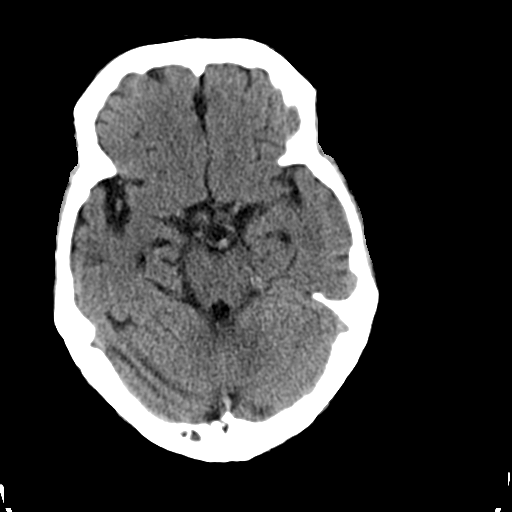
[im 10/36  bone]
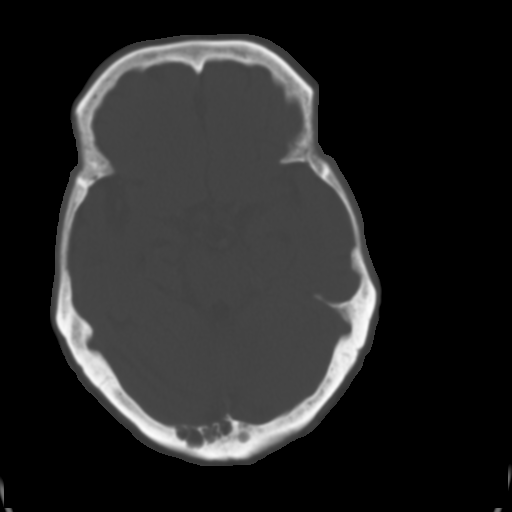
[im 13/36  brain]
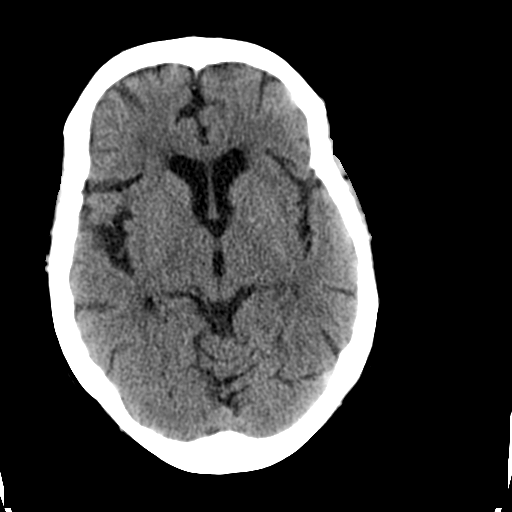
[im 15/36  brain]
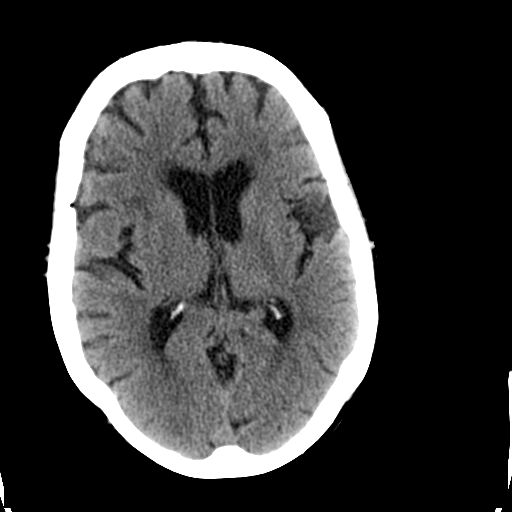
[im 17/36  brain]
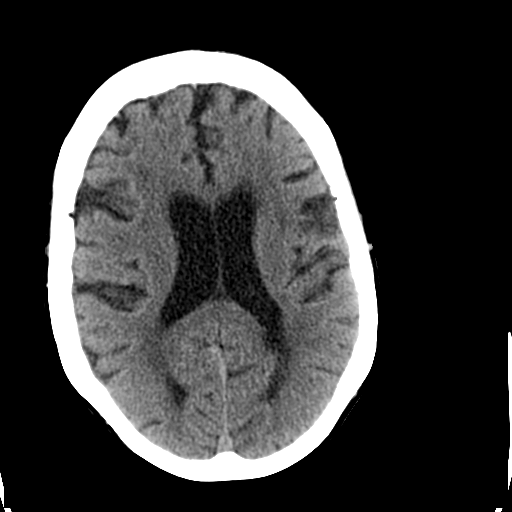
[im 19/36  brain]
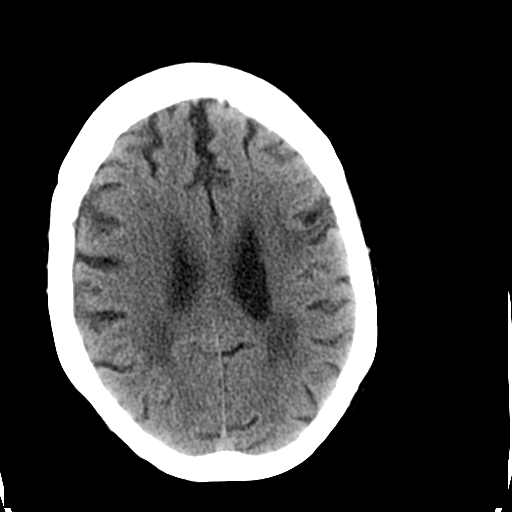
[im 19/36  bone]
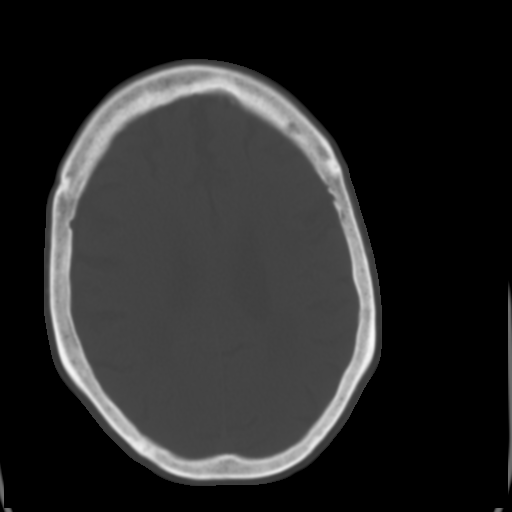
[im 21/36  brain]
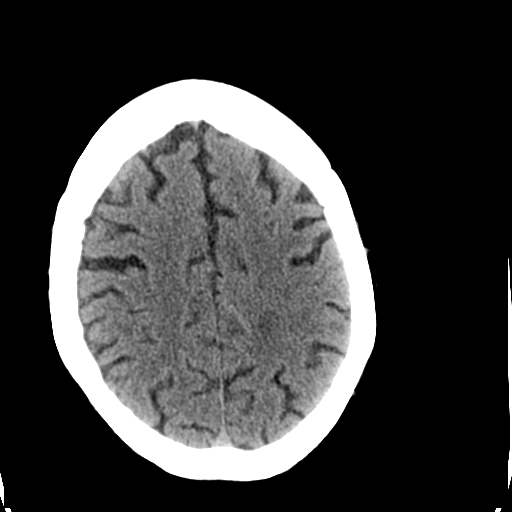
[im 23/36  brain]
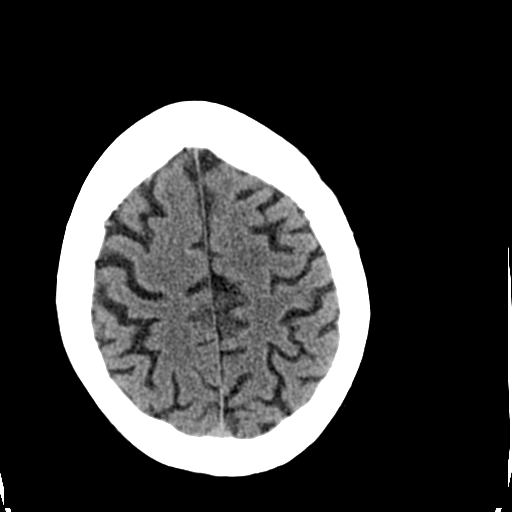
[im 26/36  brain]
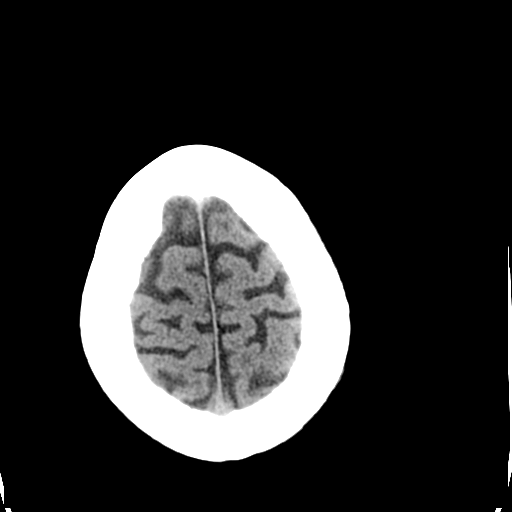
[im 27/36  brain]
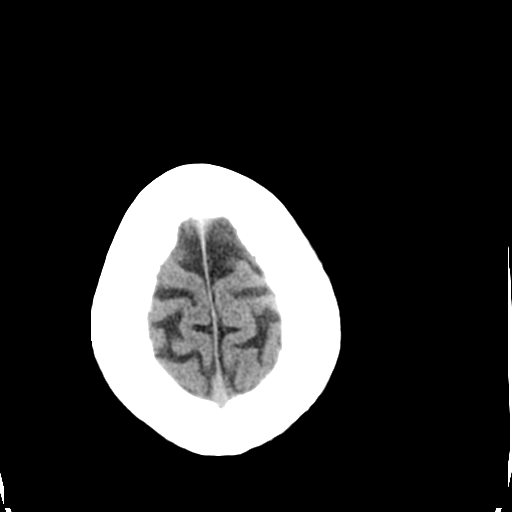
[im 27/36  bone]
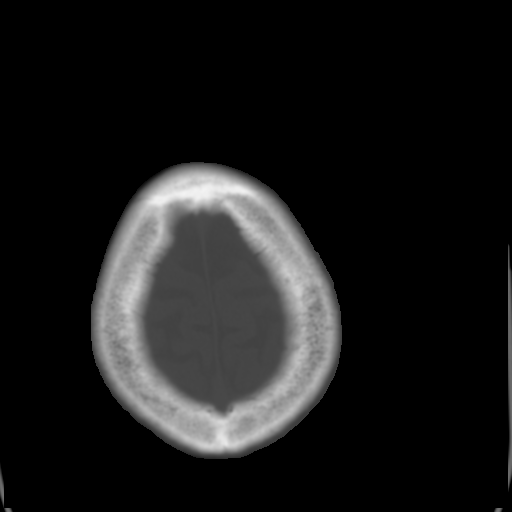
[im 29/36  brain]
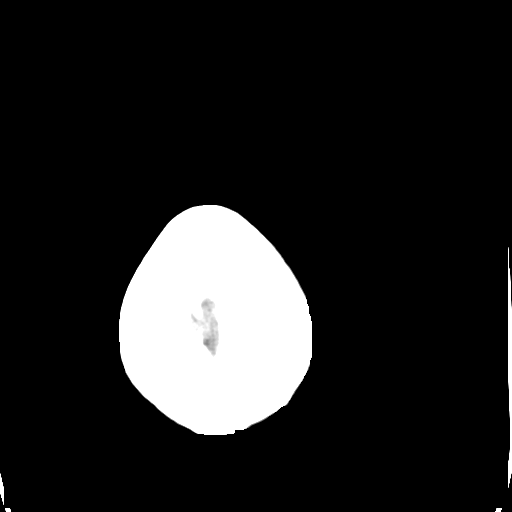
[im 32/36  brain]
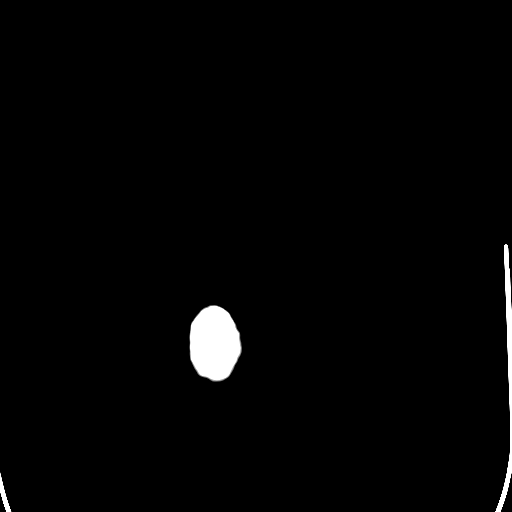
[im 34/36  brain]
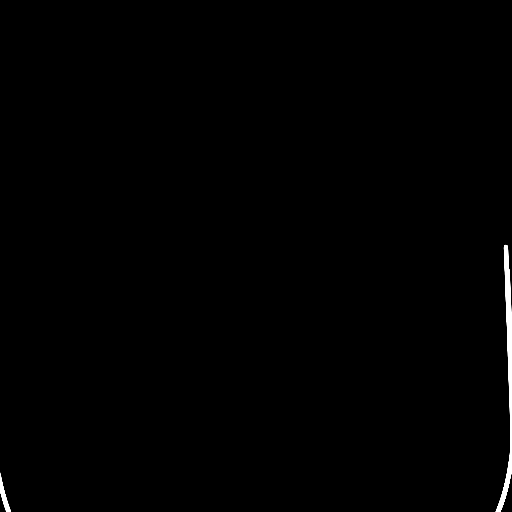

[16 of 30 positions shown; findings below may reference images not displayed]

FINDINGS: Skull:No acute osseous abnormality. No lytic or blastic lesion.

Orbits: No acute abnormality.

Brain: No evidence of acute abnormality, such as acute infarction,
hemorrhage, hydrocephalus, or mass lesion/mass effect. Unchanged
pattern of scattered cerebral white matter low attenuation, usually
related to chronic small vessel ischemia.
IMPRESSION: 1.  No evidence of acute intracranial disease.
2.  Stable senescent changes including atrophy and chronic small
vessel ischemia.

## 2015-03-20 MED ORDER — HYDROMORPHONE HCL 1 MG/ML IJ SOLN
1.0000 mg | Freq: Once | INTRAMUSCULAR | Status: AC
  Administered 2015-03-20: 1 mg via INTRAVENOUS
  Filled 2015-03-20: qty 1

## 2015-03-20 MED ORDER — POTASSIUM CHLORIDE CRYS ER 20 MEQ PO TBCR
40.0000 meq | EXTENDED_RELEASE_TABLET | ORAL | Status: AC
  Administered 2015-03-20 – 2015-03-21 (×3): 40 meq via ORAL
  Filled 2015-03-20 (×4): qty 2

## 2015-03-20 MED ORDER — CETYLPYRIDINIUM CHLORIDE 0.05 % MT LIQD
7.0000 mL | Freq: Two times a day (BID) | OROMUCOSAL | Status: DC
  Administered 2015-03-20 – 2015-03-22 (×7): 7 mL via OROMUCOSAL

## 2015-03-20 NOTE — Progress Notes (Signed)
0450 Patient restless and noted moaning c/o chronic back pain. MD notified and new order given for Dilaudid 1mg  IV push x 1 time only.

## 2015-03-20 NOTE — Progress Notes (Signed)
Attempted to give oral morning medications twice.  Patient refuses, will not open mouth for cleaning or to drink/eat.  Patient demented and confused at baseline.  Patient is alert and opens eyes, but will not follow commands when prompted.  Also has not voided.  Bladder scan shows approx 40cc of urine in bladder.  MD paged and made aware.  Will reassess voiding again this afternoon and hold PO meds for now. Will continue to monitor.

## 2015-03-20 NOTE — Progress Notes (Signed)
[facility] PROGRESS NOTE  Andrea [name] [id number] DOB: 1935-11-23 DOA: 03/19/2015 PCP: Collene Mares, PA-C  Assessment/Plan: UTI -Continue Rocephin pending culture data.  Hypokalemia -Replace orally, check magnesium level.  Generalized weakness/adult failure to thrive -Check TSH and vitamin B-12. -PT/OT consultations are pending but suspect she will need SNF as her husband is no longer able to care for her at home.  Atrial fibrillation -Rate controlled, not on anticoagulation due to fall risk  Hypertension -Well-controlled.  Code Status: DO NOT RESUSCITATE  Family Communication: Husband at bedside updated on plan of care  Disposition Plan: Likely SNF  Consultants:  None  Antibiotics:  Rocephin   Subjective: Sleeping in bed, confused   Objective: Filed Vitals:   03/19/15 1830 03/19/15 1900 03/19/15 2001 03/20/15 0415  BP: 153/129 157/95 166/69 172/90  Pulse: 49  60 79  Temp:   98.3 F (36.8 C) 99.6 F (37.6 C)  TempSrc:   Oral Oral  Resp: 14 15 16 14   Height:   5\' 2"  (1.575 m)   Weight:   44.6 kg (98 lb 5.2 oz)   SpO2: 85%  98% 95%   No intake or output data in the 24 hours ending 03/20/15 1227 Filed Weights   03/19/15 1419 03/19/15 2001  Weight: 40.824 kg (90 lb) 44.6 kg (98 lb 5.2 oz)    Exam:   General:  Awake, confused   Cardiovascular: Regular rate and rhythm   Respiratory: Clear to auscultation bilaterally   Abdomen: Soft, nontender, nondistended, positive bowel sounds   Extremities: No clubbing, cyanosis or edema, positive pulses  Neurologic:  Moves all 4 spontaneously  Data Reviewed: Basic Metabolic Panel:  Recent Labs Lab 03/19/15 1531 03/20/15 0539  NA 140 140  K 3.0* 2.9*  CL 102 102  CO2 29 25  GLUCOSE 109* 102*  BUN 10 11  CREATININE 0.46 0.51  CALCIUM 8.9 8.6*  MG  --  1.2*   Liver Function Tests:  Recent Labs Lab 03/19/15 1531 03/20/15 0539  AST 24 24  ALT 15 15  ALKPHOS 66 66  BILITOT  1.0 1.4*  PROT 6.4* 6.5  ALBUMIN 3.7 3.7   No results for input(s): LIPASE, AMYLASE in the last 168 hours. No results for input(s): AMMONIA in the last 168 hours. CBC:  Recent Labs Lab 03/19/15 1531 03/20/15 0539  WBC 8.4 9.0  NEUTROABS 5.7  --   HGB 13.1 13.2  HCT 38.6 39.3  MCV 90.4 90.6  PLT 213 222   Cardiac Enzymes: No results for input(s): CKTOTAL, CKMB, CKMBINDEX, TROPONINI in the last 168 hours. BNP (last 3 results)  Recent Labs  03/19/15 1531  BNP 302.0*    ProBNP (last 3 results) No results for input(s): PROBNP in the last 8760 hours.  CBG: No results for input(s): GLUCAP in the last 168 hours.  Recent Results (from the past 240 hour(s))  Urine culture     Status: None (Preliminary result)   Collection Time: 03/19/15  4:23 PM  Result Value Ref Range Status   Specimen Description URINE, CATHETERIZED  Final   Special Requests Normal  Final   Culture   Final    TOO YOUNG TO READ Performed at [facility]    Report Status PENDING  Incomplete  Culture, blood (routine x 2)     Status: None (Preliminary result)   Collection Time: 03/19/15  8:25 PM  Result Value Ref Range Status   Specimen Description BLOOD RIGHT ARM  Final  Special Requests BOTTLES DRAWN AEROBIC AND ANAEROBIC 5CC EACH  Final   Culture NO GROWTH < 12 HOURS  Final   Report Status PENDING  Incomplete  Culture, blood (routine x 2)     Status: None (Preliminary result)   Collection Time: 03/19/15  8:29 PM  Result Value Ref Range Status   Specimen Description BLOOD RIGHT HAND  Final   Special Requests BOTTLES DRAWN AEROBIC AND ANAEROBIC [place]  Final   Culture NO GROWTH < 12 HOURS  Final   Report Status PENDING  Incomplete     Studies: Dg Chest 1 View  03/19/2015  CLINICAL DATA:  Fall. EXAM: CHEST 1 VIEW COMPARISON:  12/14/2014. FINDINGS: Lungs are hyperexpanded with asymmetric elevation of the left hemidiaphragm. The cardio pericardial silhouette is enlarged. The lungs are clear  wiithout focal pneumonia, edema, pneumothorax or pleural effusion. Interstitial markings are diffusely coarsened with chronic features. 15 mm nodular density seen in the region of the left hilum. Telemetry leads overlie the chest. Bones are diffusely demineralized. IMPRESSION: 15 mm left hilar nodule may be related to lymphadenopathy or central lesion. CT chest with contrast recommended to further evaluate. Hyperexpansion with chronic underlying interstitial lung disease. Electronically Signed   By: Misty Stanley M.D.   On: 03/19/2015 17:35   Ct Knee Left Wo Contrast  03/19/2015  CLINICAL DATA:  Recent fall with laceration EXAM: CT OF THE left KNEE WITHOUT CONTRAST TECHNIQUE: Multidetector CT imaging of the left knee was performed according to the standard protocol. Multiplanar CT image reconstructions were also generated. COMPARISON:  None. FINDINGS: There are changes consistent with prior healed fracture of the proximal fibula. No acute fracture or dislocation is identified. Mild osteopenia is noted. No significant joint effusion is noted. The cruciate ligaments appear intact. No focal hematoma is noted. No radiopaque foreign body is seen. Diffuse vascular calcifications are noted. IMPRESSION: No acute abnormality is seen. Electronically Signed   By: Inez Catalina M.D.   On: 03/19/2015 17:44    Scheduled Meds: . antiseptic oral rinse  7 mL Mouth Rinse BID  . aspirin EC  81 mg Oral Daily  . atorvastatin  10 mg Oral QHS  . cefTRIAXone (ROCEPHIN) IVPB 1 gram/50 mL D5W  1 g Intravenous Q24H  . clopidogrel  75 mg Oral Daily  . feeding supplement (ENSURE ENLIVE)  237 mL Oral BID BM  . heparin  5,000 Units Subcutaneous 3 times per day  . metoprolol tartrate  12.5 mg Oral Daily  . pantoprazole  80 mg Oral Q1200  . polyethylene glycol  17 g Oral Daily  . potassium chloride  40 mEq Oral Q4H  . prazosin  2 mg Oral BID  . senna  2 tablet Oral Daily  . sertraline  50 mg Oral Daily  . sodium chloride flush   3 mL Intravenous Q12H  . sodium chloride flush  3 mL Intravenous Q12H   Continuous Infusions: . sodium chloride 75 mL/hr at 03/20/15 [id number]    Active Problems:   UTI (lower urinary tract infection)   Essential hypertension   Hyperlipidemia   Memory loss   Physical deconditioning   Protein calorie malnutrition (HCC)   Chronic a-fib (HCC)   History of CVA (cerebrovascular accident)    Time spent: 25 minutes.  Greater than 50% of this time was spent in direct contact with the patient coordinating care.    Lelon Frohlich  [facility] Pager [phone]  If 7PM-7AM, please contact night-coverage at [email or web address], password [facility] 03/20/2015,  12:27 PM

## 2015-03-20 NOTE — Progress Notes (Signed)
Initial Nutrition Assessment  DOCUMENTATION CODES:  Not applicable   Likely malnourished, but Unable to diagnose with available information.   INTERVENTION:  Ensure Enlive po BID, each supplement provides 350 kcal and 20 grams of protein  NUTRITION DIAGNOSIS:  Underweight related to chronic illness (Dementia) as evidenced by BMI < 18.5.  GOAL:  Patient will meet greater than or equal to 90% of their needs  MONITOR:  PO intake, Supplement acceptance  REASON FOR ASSESSMENT:  Consult Assessment of nutrition requirement/status  ASSESSMENT:  80 y/o female PMHx HTN, HLD, CAD, Cancer, Stroke, Afib, ETOH abuse.Presents with gradual mental decline over past few months w/ multiple instances of falling. Admitted for physical deconditioning. UA suggestive of infection. Pt has chronic UTIs.   RD operating remotely. Per notes. Pt has subjectively eaten very little over the past 2-3 days.  Denied any n/v. Pt is a smoker.   Pt's admit weight was 90 lbs. If tis is correct, she has lost 14% of her bw in the past 2 months.  NFPE: Unable to assess, likely with element of wasting  Labs reviewed: Hypokalemic  Diet Order:  Diet Heart Room service appropriate?: Yes; Fluid consistency:: Thin  Skin: laceration knee, ecchymotic  Last BM:  1/27  Height:  Ht Readings from Last 1 Encounters:  03/19/15 5\' 2"  (1.575 m)   Weight:  Wt Readings from Last 1 Encounters:  03/19/15 98 lb 5.2 oz (44.6 kg)   Wt Readings from Last 10 Encounters:  03/19/15 98 lb 5.2 oz (44.6 kg)  01/21/15 105 lb (47.628 kg)  12/15/14 103 lb 1.6 oz (46.766 kg)  12/04/14 105 lb (47.628 kg)  05/13/14 104 lb (47.174 kg)  11/21/13 112 lb 3.2 oz (50.894 kg)  11/07/13 115 lb 14.4 oz (52.572 kg)  11/04/13 108 lb (48.988 kg)  09/19/13 109 lb (49.442 kg)  05/13/13 111 lb (50.349 kg)  Admit weight: 90 lbs  Ideal Body Weight:  50 kg  BMI:  Body mass index is 17.98 kg/(m^2).  Estimated Nutritional Needs:  Kcal:  1450-1650  (35-40 kcal/kg bw) Protein:  50-60 g (1.2-1.4 g/kg bw) Fluid:  1.5-1.7 liters fluid  EDUCATION NEEDS:  No education needs identified at this time  Burtis Junes RD, LDN Nutrition Pager: [phone] 03/20/2015 9:32 AM

## 2015-03-21 DIAGNOSIS — Z8673 Personal history of transient ischemic attack (TIA), and cerebral infarction without residual deficits: Secondary | ICD-10-CM

## 2015-03-21 LAB — BASIC METABOLIC PANEL
Anion gap: 11 (ref 5–15)
BUN: 13 mg/dL (ref 6–20)
CHLORIDE: 108 mmol/L (ref 101–111)
CO2: 23 mmol/L (ref 22–32)
Calcium: 8.8 mg/dL — ABNORMAL LOW (ref 8.9–10.3)
Creatinine, Ser: 0.51 mg/dL (ref 0.44–1.00)
GFR calc Af Amer: >60 mL/min (ref >60)
GLUCOSE: 100 mg/dL — AB (ref 65–99)
POTASSIUM: 4.5 mmol/L (ref 3.5–5.1)
Sodium: 142 mmol/L (ref 135–145)

## 2015-03-21 MED ORDER — MAGNESIUM SULFATE 2 GM/50ML IV SOLN
2.0000 g | Freq: Once | INTRAVENOUS | Status: AC
  Administered 2015-03-21: 2 g via INTRAVENOUS
  Filled 2015-03-21: qty 50

## 2015-03-21 NOTE — Progress Notes (Signed)
[facility] PROGRESS NOTE  Andrea [name] [id number] DOB: 12-07-1935 DOA: 03/19/2015 PCP: Collene Mares, PA-C  Assessment/Plan: UTI -Continue Rocephin pending culture data.  Hypokalemia -Replace orally.  Hypomagnesemia -Will replete.  Generalized weakness/adult failure to thrive -TSH/B 12 within normal limits. -PT/OT consultations are pending but suspect she will need SNF as her husband is no longer able to care for her at home. [facility] also request palliative care consultation given her comorbidities and poor long-term prognosis.  Atrial fibrillation -Rate controlled, not on anticoagulation due to fall risk  Hypertension -Well-controlled.  Code Status: DO NOT RESUSCITATE  Family Communication: Husband at bedside updated on plan of care  Disposition Plan: Likely SNF  Consultants:  None  Antibiotics:  Rocephin   Subjective: Sleeping in bed, confused   Objective: Filed Vitals:   03/20/15 1500 03/20/15 2054 03/20/15 2117 03/21/15 0500  BP: 181/91 164/95  132/100  Pulse: 77 103 92 85  Temp: 98 F (36.7 C) 99.1 F (37.3 C)  98.6 F (37 C)  TempSrc: Oral Oral  Oral  Resp: 16 18 24 22   Height:      Weight:      SpO2: 97% 95% 94% 97%    Intake/Output Summary (Last 24 hours) at 03/21/15 1221 Last data filed at 03/21/15 0830  Gross per 24 hour  Intake    240 ml  Output    675 ml  Net   -435 ml   Filed Weights   03/19/15 1419 03/19/15 2001  Weight: 40.824 kg (90 lb) 44.6 kg (98 lb 5.2 oz)    Exam:   General:  Awake, confused   Cardiovascular: Regular rate and rhythm   Respiratory: Clear to auscultation bilaterally   Abdomen: Soft, nontender, nondistended, positive bowel sounds   Extremities: No clubbing, cyanosis or edema, positive pulses  Neurologic:  Moves all 4 spontaneously  Data Reviewed: Basic Metabolic Panel:  Recent Labs Lab 03/19/15 1531 03/20/15 0539 03/21/15 0721  NA 140 140 142  K 3.0* 2.9* 4.5  CL 102 102  108  CO2 29 25 23   GLUCOSE 109* 102* 100*  BUN 10 11 13   CREATININE 0.46 0.51 0.51  CALCIUM 8.9 8.6* 8.8*  MG  --  1.2*  --    Liver Function Tests:  Recent Labs Lab 03/19/15 1531 03/20/15 0539  AST 24 24  ALT 15 15  ALKPHOS 66 66  BILITOT 1.0 1.4*  PROT 6.4* 6.5  ALBUMIN 3.7 3.7   No results for input(s): LIPASE, AMYLASE in the last 168 hours. No results for input(s): AMMONIA in the last 168 hours. CBC:  Recent Labs Lab 03/19/15 1531 03/20/15 0539  WBC 8.4 9.0  NEUTROABS 5.7  --   HGB 13.1 13.2  HCT 38.6 39.3  MCV 90.4 90.6  PLT 213 222   Cardiac Enzymes: No results for input(s): CKTOTAL, CKMB, CKMBINDEX, TROPONINI in the last 168 hours. BNP (last 3 results)  Recent Labs  03/19/15 1531  BNP 302.0*    ProBNP (last 3 results) No results for input(s): PROBNP in the last 8760 hours.  CBG: No results for input(s): GLUCAP in the last 168 hours.  Recent Results (from the past 240 hour(s))  Urine culture     Status: None (Preliminary result)   Collection Time: 03/19/15  4:23 PM  Result Value Ref Range Status   Specimen Description URINE, CATHETERIZED  Final   Special Requests Normal  Final   Culture   Final    TOO YOUNG TO  READ Performed at [facility]    Report Status PENDING  Incomplete  Culture, blood (routine x 2)     Status: None (Preliminary result)   Collection Time: 03/19/15  8:25 PM  Result Value Ref Range Status   Specimen Description BLOOD RIGHT ARM  Final   Special Requests BOTTLES DRAWN AEROBIC AND ANAEROBIC 5CC EACH  Final   Culture NO GROWTH < 12 HOURS  Final   Report Status PENDING  Incomplete  Culture, blood (routine x 2)     Status: None (Preliminary result)   Collection Time: 03/19/15  8:29 PM  Result Value Ref Range Status   Specimen Description BLOOD RIGHT HAND  Final   Special Requests BOTTLES DRAWN AEROBIC AND ANAEROBIC [place]  Final   Culture NO GROWTH < 12 HOURS  Final   Report Status PENDING  Incomplete      Studies: Dg Chest 1 View  03/19/2015  CLINICAL DATA:  Fall. EXAM: CHEST 1 VIEW COMPARISON:  12/14/2014. FINDINGS: Lungs are hyperexpanded with asymmetric elevation of the left hemidiaphragm. The cardio pericardial silhouette is enlarged. The lungs are clear wiithout focal pneumonia, edema, pneumothorax or pleural effusion. Interstitial markings are diffusely coarsened with chronic features. 15 mm nodular density seen in the region of the left hilum. Telemetry leads overlie the chest. Bones are diffusely demineralized. IMPRESSION: 15 mm left hilar nodule may be related to lymphadenopathy or central lesion. CT chest with contrast recommended to further evaluate. Hyperexpansion with chronic underlying interstitial lung disease. Electronically Signed   By: Misty Stanley M.D.   On: 03/19/2015 17:35   Ct Knee Left Wo Contrast  03/19/2015  CLINICAL DATA:  Recent fall with laceration EXAM: CT OF THE left KNEE WITHOUT CONTRAST TECHNIQUE: Multidetector CT imaging of the left knee was performed according to the standard protocol. Multiplanar CT image reconstructions were also generated. COMPARISON:  None. FINDINGS: There are changes consistent with prior healed fracture of the proximal fibula. No acute fracture or dislocation is identified. Mild osteopenia is noted. No significant joint effusion is noted. The cruciate ligaments appear intact. No focal hematoma is noted. No radiopaque foreign body is seen. Diffuse vascular calcifications are noted. IMPRESSION: No acute abnormality is seen. Electronically Signed   By: Inez Catalina M.D.   On: 03/19/2015 17:44    Scheduled Meds: . antiseptic oral rinse  7 mL Mouth Rinse BID  . aspirin EC  81 mg Oral Daily  . atorvastatin  10 mg Oral QHS  . cefTRIAXone (ROCEPHIN) IVPB 1 gram/50 mL D5W  1 g Intravenous Q24H  . clopidogrel  75 mg Oral Daily  . feeding supplement (ENSURE ENLIVE)  237 mL Oral BID BM  . heparin  5,000 Units Subcutaneous 3 times per day  . metoprolol  tartrate  12.5 mg Oral Daily  . pantoprazole  80 mg Oral Q1200  . polyethylene glycol  17 g Oral Daily  . prazosin  2 mg Oral BID  . senna  2 tablet Oral Daily  . sertraline  50 mg Oral Daily  . sodium chloride flush  3 mL Intravenous Q12H  . sodium chloride flush  3 mL Intravenous Q12H   Continuous Infusions: . sodium chloride 75 mL/hr at 03/20/15 [id number]    Active Problems:   UTI (lower urinary tract infection)   Essential hypertension   Hyperlipidemia   Memory loss   Physical deconditioning   Protein calorie malnutrition (HCC)   Chronic a-fib (HCC)   History of CVA (cerebrovascular accident)  Time spent: 25 minutes.  Greater than 50% of this time was spent in direct contact with the patient coordinating care.    Lelon Frohlich  [facility] Pager [phone]  If 7PM-7AM, please contact night-coverage at [email or web address], password [facility] 03/21/2015, 12:21 PM  LOS: 1 day

## 2015-03-22 ENCOUNTER — Encounter (HOSPITAL_COMMUNITY): Payer: Self-pay | Admitting: Primary Care

## 2015-03-22 DIAGNOSIS — Z7189 Other specified counseling: Secondary | ICD-10-CM | POA: Insufficient documentation

## 2015-03-22 DIAGNOSIS — Z515 Encounter for palliative care: Secondary | ICD-10-CM | POA: Insufficient documentation

## 2015-03-22 LAB — MAGNESIUM: MAGNESIUM: 1.5 mg/dL — AB (ref 1.7–2.4)

## 2015-03-22 MED ORDER — BISACODYL 10 MG RE SUPP: 10.0000 mg | Freq: Every day | RECTAL | Status: DC | PRN

## 2015-03-22 MED ORDER — SENNOSIDES-DOCUSATE SODIUM 8.6-50 MG PO TABS
1.0000 | ORAL_TABLET | Freq: Two times a day (BID) | ORAL | Status: DC
  Administered 2015-03-22 – 2015-03-23 (×3): 1 via ORAL
  Filled 2015-03-22 (×3): qty 1

## 2015-03-22 NOTE — Progress Notes (Signed)
Nutrition Follow-up  DOCUMENTATION CODES:   Severe malnutrition in context of chronic illness   Updated per follow-up visit.  INTERVENTION:   Ensure Enlive po BID, each supplement provides 350 kcal and 20 grams of protein.   Ongoing  NUTRITION DIAGNOSIS:   Malnutrition related to poor appetite, lethargy/confusion, chronic illness as evidenced by severe depletion of body fat, severe depletion of muscle mass, energy intake < or equal to 75% for > or equal to 1 month, meal completion < 25%.  Updated per follow-up visit.  GOAL:   Patient will meet greater than or equal to 90% of their needs, Weight gain  Ongoing  MONITOR:   PO intake, Supplement acceptance, Labs, Weight trends  REASON FOR ASSESSMENT:   Consult Assessment of nutrition requirement/status  ASSESSMENT:   80 y.o. Female; pt unable to provide reliable history due to baseline confusion. History provided by pt and husband and EDP. Pt w/ gadual physical and mental decline over the past few months. Multiple recent falls. Husband unable to physically care for patient any longer. Patient was being treated with Trimpex for chronic UTIs but ran out of this medication in December.    Met with patient who was sitting up in her chair with her lunch in front of her.  Patient had not eaten any of her lunch.  She had about two spoonfuls of applesauce and about 1/2 of an Ensure.  Patient and family member reports poor appetite for several months now.  Per chart review, meal completion is between 10 - 15%.    Per chart review, palliative care has been consulted where patients husband reports she formerly weighed 115 - 120lbs, and now 98 lbs.  Patient appeared very frail and thin. Nutrition Focused Physical Exam was completed.  Findings were severe fat and muscle depletion and no edema.    Patient is wheel chair bound due to multiple falls.  She was having a hard time sitting up straight in the chair and was difficult to understand  due to dementia/confusion.  Patient did say several times "I am just not hungry".  Patient was living at home and being cared for by husband.  It has been recommended that when patient is discharged she goes to a SNF or LTC facility.   Medications and labs were reviewed.   RD urged patient to keep drinking the Ensure supplement to help give her necessary calories and protein.     Diet Order:  Diet Heart Room service appropriate?: Yes; Fluid consistency:: Thin  Skin:  Reviewed, no issues  Last BM:  1/27  Height:   Ht Readings from Last 1 Encounters:  03/19/15 5' 2"  (1.575 m)   Weight:   Wt Readings from Last 1 Encounters:  03/19/15 98 lb 5.2 oz (44.6 kg)   Ideal Body Weight:  50 kg  BMI:  Body mass index is 17.98 kg/(m^2).  Estimated Nutritional Needs:   Kcal:  1450-1650 (35-40 kcal/kg bw)  Protein:  50-60 g (1.2-1.4 g/kg bw)  Fluid:  1.5-1.7 liters fluid  EDUCATION NEEDS:   No education needs identified at this time  Veronda Prude, Dietetic Intern Pager: [phone]

## 2015-03-22 NOTE — Progress Notes (Signed)
Pt was asking nurse about smoking cigarettes. Counseled on smoking cessation. MD notified.

## 2015-03-22 NOTE — Evaluation (Signed)
Occupational Therapy Evaluation Patient Details Name: Andrea [name] MRN: [id number] DOB: [birth year] Today's Date: 03/22/2015    History of Present Illness Pt w/ gradual physical and mental decline over the past few months. Multiple recent falls. Husband unable to physically care for patient any longer. Patient was being treated with Trimpex for chronic UTIs but ran out of this medication in December. Patient is without complaint at this time but has eaten very little over the last 2-3 days which is unusual for her.    Clinical Impression   Pt in bed upon therapy arrival. Agreeable to get into chair to eat breakfast. Limited ability to communicate although was able to voice that she was in no pain and that she wasn't able to think straight and that she wanted to go home. Total Assist needed for transfer (mod assist x1 and min assist x1). Pt with decreased strength in upper and lower extremities. Family member present for portion of evaluation stating that decreased strength has been happening gradually over the past few months. Husband has been providing total care although it's beginning to take a toll and he is unable to continue. Due to patient's declining medical status she will not be able to tolerate skilled rehab. Recommend long term care at discharge.     Follow Up Recommendations  Other (comment) (Long term care. Unable to tolerate SNF due to decreased medical status Pt will require total care)    Equipment Recommendations  None recommended by OT       Precautions / Restrictions Precautions Precautions: Fall Restrictions Weight Bearing Restrictions: No      Mobility Bed Mobility Overal bed mobility: +2 for physical assistance             General bed mobility comments: total assist x1 with HOB elevated or Max(A)x2   Transfers Overall transfer level: Needs assistance   Transfers: Sit to/from Stand;Stand Pivot Transfers Sit to Stand: Total assist;+2  safety/equipment;+2 physical assistance;From elevated surface Stand pivot transfers: Total assist;+2 safety/equipment;+2 physical assistance;From elevated surface            Balance Overall balance assessment: Needs assistance Sitting-balance support: Bilateral upper extremity supported Sitting balance-Leahy Scale: Poor   Postural control: Posterior lean Standing balance support: No upper extremity supported Standing balance-Leahy Scale: Poor                              ADL Overall ADL's : Needs assistance/impaired Eating/Feeding: Total assistance;Sitting   Grooming: Total assistance;Sitting               Lower Body Dressing: Total assistance   Toilet Transfer: Total assistance           Functional mobility during ADLs: Total assistance;+2 for physical assistance;+2 for safety/equipment General ADL Comments: Total assistance X2. Mod A  x1 plue Min A x1 for guidance and safety.                Pertinent Vitals/Pain Pain Assessment: No/denies pain     Hand Dominance Right   Extremity/Trunk Assessment Upper Extremity Assessment Upper Extremity Assessment: Generalized weakness   Lower Extremity Assessment Lower Extremity Assessment: Defer to PT evaluation       Communication Communication Communication: Expressive difficulties   Cognition Arousal/Alertness: Awake/alert Behavior During Therapy: Anxious;Flat affect;Restless Overall Cognitive Status: History of cognitive impairments - at baseline       Memory: Decreased recall of precautions;Decreased short-term memory  Home Living Family/patient expects to be discharged to:: Skilled nursing facility Living Arrangements: Spouse/significant other Available Help at Discharge: Other (Comment) (Husband is not able to provide assistance needed as before. ) Type of Home: House Home Access: Ramped entrance     Home Layout: One level                    Additional Comments: uanble to ask patietn       Prior Functioning/Environment Level of Independence: Needs assistance  Gait / Transfers Assistance Needed: Patient has not used walker recently due to decreased strength in bilateral UE per family. ADL's / Homemaking Assistance Needed: Total Assistance from husband.                        Barriers to D/C:  Decreased support at home. Husband was provided total care although the past few months have been very hard and he is unable to continue with the amount of care needed.           Co-evaluation PT/OT/SLP Co-Evaluation/Treatment: Yes Reason for Co-Treatment: Complexity of the patient's impairments (multi-system involvement);Necessary to address cognition/behavior during functional activity;For patient/therapist safety PT goals addressed during session: Mobility/safety with mobility;Balance OT goals addressed during session: ADL's and self-care      End of Session Equipment Utilized During Treatment: Gait belt Nurse Communication: Other (comment) (Need for assistance with breakfast)  Activity Tolerance: Patient limited by fatigue Patient left: in chair;with call bell/phone within reach;with chair alarm set   Time: [id number] OT Time Calculation (min): 45 min Charges:  OT General Charges $OT Visit: 1 Procedure OT Evaluation $OT Eval Low Complexity: 1 Procedure G-CodesAilene Ravel, OTR/L,CBIS  [phone]  03/22/2015, 9:25 AM

## 2015-03-22 NOTE — Evaluation (Signed)
Physical Therapy Evaluation Patient Details Name: Andrea [name] MRN: [id number] DOB: 03-19-1935 Today's Date: 03/22/2015   History of Present Illness  pt unable to provide reliable history due to baseline confusion. History provided by pt and husband and EDP. Pt w/ gadual physical and mental decline over the past few months. Multiple recent falls. Husband unable to physically care for patient any longer. Patient was being treated with Trimpex for chronic UTIs but ran out of this medication in December. Patient is without complaint at this time but has eaten very little over the last 2-3 days which is unusual for her. Denies any chest pain, shortness breath, palpitations, dysuria, frequency, abdominal pain, fevers, nausea, vomiting. Symptoms are constant.   Clinical Impression  Patient found supine in bed, somewhat restless and anxious, unable to communicate clearly with PT/OT until family member arrives, when she stated "..I want to go home". Patient requires quite a bit of assistance with mobility today, with total assist for transfer over to chair. Left sitting upright in chair with OT in room continuing skilled OT evaluation. At this time recommend SNF/long term care when patient is discharged from hospital by MD. PT will continue to work with patient as she is able with sitting balance and transfers. CNA notified regarding patient's mobility level.     Follow Up Recommendations SNF    Equipment Recommendations  None recommended by PT    Recommendations for Other Services Speech consult     Precautions / Restrictions Precautions Precautions: Fall Restrictions Weight Bearing Restrictions: No      Mobility  Bed Mobility Overal bed mobility: +2 for physical assistance             General bed mobility comments: total assist x1 with HOB elevated or Max(A)x2   Transfers Overall transfer level: Needs assistance   Transfers: Sit to/from Stand;Stand Pivot Transfers Sit to  Stand: Max assist Stand pivot transfers: Total assist          Ambulation/Gait             General Gait Details: unable to ambulate   Stairs            Wheelchair Mobility    Modified Rankin (Stroke Patients Only)       Balance Overall balance assessment: Needs assistance Sitting-balance support: Bilateral upper extremity supported Sitting balance-Leahy Scale: Poor   Postural control: Posterior lean Standing balance support: No upper extremity supported Standing balance-Leahy Scale: Poor                               Pertinent Vitals/Pain Pain Assessment: No/denies pain    Home Living Family/patient expects to be discharged to:: Private residence Living Arrangements: Spouse/significant other Available Help at Discharge: Family;Available 24 hours/day Type of Home: House Home Access: Ramped entrance     Home Layout: One level   Additional Comments: uanble to ask patietn     Prior Function                 Hand Dominance        Extremity/Trunk Assessment   Upper Extremity Assessment: Defer to OT evaluation           Lower Extremity Assessment: Generalized weakness         Communication      Cognition Arousal/Alertness: Awake/alert Behavior During Therapy: Anxious Overall Cognitive Status: History of cognitive impairments - at baseline       Memory:  Decreased recall of precautions;Decreased short-term memory              General Comments      Exercises        Assessment/Plan    PT Assessment Patient needs continued PT services  PT Diagnosis Generalized weakness   PT Problem List Decreased strength;Decreased activity tolerance;Decreased balance;Decreased safety awareness;Decreased mobility  PT Treatment Interventions Therapeutic exercise;Functional mobility training;Therapeutic activities;Patient/family education   PT Goals (Current goals can be found in the Care Plan section) Acute Rehab PT  Goals PT Goal Formulation: Patient unable to participate in goal setting    Frequency Min 3X/week   Barriers to discharge        Co-evaluation               End of Session Equipment Utilized During Treatment: Gait belt Activity Tolerance: Patient tolerated treatment well Patient left: in chair;with chair alarm set;Other (comment) (OT present ) Nurse Communication: Mobility status (CNA notified regarding mobility status )    Functional Assessment Tool Used: Based on skilled clincial assessment of functional mobililty,posture, balance, transfers  Functional Limitation: Changing and maintaining body position Changing and Maintaining Body Position Current Status [id number]): At least 80 percent but less than 100 percent impaired, limited or restricted Changing and Maintaining Body Position Goal Status [id number]): At least 60 percent but less than 80 percent impaired, limited or restricted    Time: 0840-0859 PT Time Calculation (min) (ACUTE ONLY): 19 min   Charges:   PT Evaluation $PT Eval Low Complexity: 1 Procedure     PT G Codes:   PT G-Codes **NOT FOR INPATIENT CLASS** Functional Assessment Tool Used: Based on skilled clincial assessment of functional mobililty,posture, balance, transfers  Functional Limitation: Changing and maintaining body position Changing and Maintaining Body Position Current Status [id number]): At least 80 percent but less than 100 percent impaired, limited or restricted Changing and Maintaining Body Position Goal Status [id number]): At least 60 percent but less than 80 percent impaired, limited or restricted    Deniece Ree PT, DPT [phone]

## 2015-03-22 NOTE — Progress Notes (Signed)
[facility] PROGRESS NOTE  Andrea [name] [id number] DOB: [birth year] DOA: 03/19/2015 PCP: Collene Mares, PA-C  Assessment/Plan: UTI -Continue Rocephin pending culture data.  Hypokalemia -Replaced orally.  Hypomagnesemia -Will replete. -Recheck levels in am.  Generalized weakness/adult failure to thrive -TSH/B 12 within normal limits. -PT/OT recs SNF. -We'll also request palliative care consultation given her comorbidities and poor long-term prognosis.  Atrial fibrillation -Rate controlled, not on anticoagulation due to fall risk  Hypertension -Well-controlled.  Code Status: DO NOT RESUSCITATE  Family Communication: Husband Tom at bedside updated on plan of care and all questions answered. Disposition Plan: Likely SNF  Consultants:  None  Antibiotics:  Rocephin   Subjective: Sitting in chair, remains confused  Objective: Filed Vitals:   03/21/15 1500 03/21/15 2010 03/21/15 2110 03/22/15 0614  BP: 141/85  156/98 159/100  Pulse: 74  89 88  Temp: 98.4 F (36.9 C)  98.5 F (36.9 C) 98.9 F (37.2 C)  TempSrc: Axillary  Oral Oral  Resp: 22  24 23   Height:      Weight:      SpO2: 96% 97% 94% 95%    Intake/Output Summary (Last 24 hours) at 03/22/15 1134 Last data filed at 03/22/15 1001  Gross per 24 hour  Intake    600 ml  Output    550 ml  Net     50 ml   Filed Weights   03/19/15 1419 03/19/15 2001  Weight: 40.824 kg (90 lb) 44.6 kg (98 lb 5.2 oz)    Exam:   General:  Awake, confused   Cardiovascular: Regular rate and rhythm   Respiratory: Clear to auscultation bilaterally   Abdomen: Soft, nontender, nondistended, positive bowel sounds   Extremities: No clubbing, cyanosis or edema, positive pulses  Neurologic:  Moves all 4 spontaneously  Data Reviewed: Basic Metabolic Panel:  Recent Labs Lab 03/19/15 1531 03/20/15 0539 03/21/15 0721  NA 140 140 142  K 3.0* 2.9* 4.5  CL 102 102 108  CO2 29 25 23   GLUCOSE 109* 102*  100*  BUN 10 11 13   CREATININE 0.46 0.51 0.51  CALCIUM 8.9 8.6* 8.8*  MG  --  1.2*  --    Liver Function Tests:  Recent Labs Lab 03/19/15 1531 03/20/15 0539  AST 24 24  ALT 15 15  ALKPHOS 66 66  BILITOT 1.0 1.4*  PROT 6.4* 6.5  ALBUMIN 3.7 3.7   No results for input(s): LIPASE, AMYLASE in the last 168 hours. No results for input(s): AMMONIA in the last 168 hours. CBC:  Recent Labs Lab 03/19/15 1531 03/20/15 0539  WBC 8.4 9.0  NEUTROABS 5.7  --   HGB 13.1 13.2  HCT 38.6 39.3  MCV 90.4 90.6  PLT 213 222   Cardiac Enzymes: No results for input(s): CKTOTAL, CKMB, CKMBINDEX, TROPONINI in the last 168 hours. BNP (last 3 results)  Recent Labs  03/19/15 1531  BNP 302.0*    ProBNP (last 3 results) No results for input(s): PROBNP in the last 8760 hours.  CBG: No results for input(s): GLUCAP in the last 168 hours.  Recent Results (from the past 240 hour(s))  Urine culture     Status: None (Preliminary result)   Collection Time: 03/19/15  4:23 PM  Result Value Ref Range Status   Specimen Description URINE, CATHETERIZED  Final   Special Requests Normal  Final   Culture   Final    CULTURE REINCUBATED FOR BETTER GROWTH Performed at [facility]  Report Status PENDING  Incomplete  Culture, blood (routine x 2)     Status: None (Preliminary result)   Collection Time: 03/19/15  8:25 PM  Result Value Ref Range Status   Specimen Description BLOOD RIGHT ARM  Final   Special Requests BOTTLES DRAWN AEROBIC AND ANAEROBIC 5CC EACH  Final   Culture NO GROWTH 3 DAYS  Final   Report Status PENDING  Incomplete  Culture, blood (routine x 2)     Status: None (Preliminary result)   Collection Time: 03/19/15  8:29 PM  Result Value Ref Range Status   Specimen Description BLOOD RIGHT HAND  Final   Special Requests BOTTLES DRAWN AEROBIC AND ANAEROBIC 6CC EACH  Final   Culture NO GROWTH 3 DAYS  Final   Report Status PENDING  Incomplete     Studies: No results  found.  Scheduled Meds: . antiseptic oral rinse  7 mL Mouth Rinse BID  . aspirin EC  81 mg Oral Daily  . atorvastatin  10 mg Oral QHS  . cefTRIAXone (ROCEPHIN) IVPB 1 gram/50 mL D5W  1 g Intravenous Q24H  . clopidogrel  75 mg Oral Daily  . feeding supplement (ENSURE ENLIVE)  237 mL Oral BID BM  . heparin  5,000 Units Subcutaneous 3 times per day  . metoprolol tartrate  12.5 mg Oral Daily  . pantoprazole  80 mg Oral Q1200  . polyethylene glycol  17 g Oral Daily  . prazosin  2 mg Oral BID  . senna  2 tablet Oral Daily  . sertraline  50 mg Oral Daily  . sodium chloride flush  3 mL Intravenous Q12H  . sodium chloride flush  3 mL Intravenous Q12H   Continuous Infusions: . sodium chloride 75 mL/hr at 03/22/15 [id number]    Active Problems:   UTI (lower urinary tract infection)   Essential hypertension   Hyperlipidemia   Memory loss   Physical deconditioning   Protein calorie malnutrition (HCC)   Chronic a-fib (HCC)   History of CVA (cerebrovascular accident)    Time spent: 25 minutes.  Greater than 50% of this time was spent in direct contact with the patient coordinating care.    Lelon Frohlich  [facility] Pager [phone]  If 7PM-7AM, please contact night-coverage at [email or web address], password [facility] 03/22/2015, 11:34 AM  LOS: 2 days

## 2015-03-22 NOTE — Consult Note (Signed)
Consultation Note Date: 03/22/2015   Patient Name: Andrea [name]  DOB: January 18, 1936  MRN: [id number]  Age / Sex: 80 y.o., female  PCP: Cory Munch, PA-C Referring Physician: Koleen Nimrod Acost*  Reason for Consultation: Establishing goals of care and Psychosocial/spiritual support    Clinical Assessment/Narrative: [name] is sitting up in her chair her husband at her side.  She makes eye contact and greets me.   She tells me that she is hurting, but is unable to give more info.  Mr. Manikowski tells me that she has been wc bound for the last 2-3 years, stroke in 2012 or 13, and that he is now having to do "everything" for her.  He shares that she has put a lit cigarette in the trash can twice now.  He shares that [facility] services ended 12/21 and he was having trouble scheduling appointment for her urologist.  Mr. Smyth shares that she has gotten "to the point that I cant take care of her".   I ask if she has ever shared what would be important to her during this time in her life, but she has not.  He tells me that she would not want to live in LTC on an extended basis, that she even rejects his care at times, but has accepted it.  I share the chronic illness trajectory and we discuss dehydration, coming in for fluids and getting dehydrated again.  Mr. Sanft tells me that this is what is happening.   I share the concepts of do not treat the next infection, do not re hospitalize, allow a natural death.  I share that I dont think she is at that point at this time, but this is what some families choose when quality of life declines.  We talk about her frailty, he shares that she has not been eating much over the last few months, and has lost weight in the last year. Formerly 115-120 lbs, and now 98 lbs. Mr. Holford tells me that his preference is for her to go to [facility], I share that he will need to apply for medicaid  and or have private pay if she does not qualify for a skilled need.  He tells me that he will contact her daughter to discuss plan.   Contacts/Participants in Discussion: present today, [name] and husband Tom Primary Decision Maker: Gershon Mussel has durable POA, and makes day to day decisions.    Relationship to Patient husband HCPOA: yes  Daughter, Mady Haagensen in [place] is legal HCPOA.   Mr. and Mrs. Cederquist were married in 2005.   SUMMARY OF RECOMMENDATIONS  Code Status/Advance Care Planning: DNR status verified.  We discuss the concepts of allow a natural death, do not treat the next infection, do not rehospitalize, let nature take it's course.  I advise that we are not saying to not bring her back to hospital, but at some point they may make these choices. Mr. Legerski shares that we have discussed some of the things that he has been thinking.     Code Status Orders        Start     Ordered   03/19/15 1905  Do not attempt resuscitation (DNR)   Continuous    Question Answer Comment  In the event of cardiac or respiratory ARREST Do not call a "code blue"   In the event of cardiac or respiratory ARREST Do not perform Intubation, CPR, defibrillation or ACLS   In the event of  cardiac or respiratory ARREST Use medication by any route, position, wound care, and other measures to relive pain and suffering. May use oxygen, suction and manual treatment of airway obstruction as needed for comfort.      03/19/15 1907    Code Status History    Date Active Date Inactive Code Status Order ID Comments User Context   12/14/2014  4:55 PM 12/17/2014  6:25 PM Full Code [id number]  Doree Albee, MD ED   11/05/2013  9:07 PM 11/11/2013  8:49 PM Full Code [id number]  Doree Albee, MD ED   10/04/2012  6:28 AM 10/05/2012  2:54 PM Full Code [id number]  Bonnielee Haff, MD Inpatient   04/29/2011  5:37 PM 05/02/2011  6:11 PM DNR [id number]  Robbie Lis, MD ED   04/29/2011  5:30 PM 04/29/2011  5:37 PM Full Code  [id number]  Robbie Lis, MD ED      Other Directives:None  Symptom Management:   Per hospitalist.   Palliative Prophylaxis:   Aspiration, Bowel Regimen and Turn Reposition  Additional Recommendations (Limitations, Scope, Preferences):  treat the treatable at this point.   Psycho-social/Spiritual:  Support System: Adequate, lives with 60 yo husband who is sole caregiver, lifting patient for all needs.  Desire for further Chaplaincy support:Not discussed today.  Additional Recommendations: None at this time.   Prognosis: Unable to determine, based on outcomes.   Discharge Planning: [place] for rehab with Palliative care service follow-up VS LTC.     Chief Complaint/ Primary Diagnoses: Present on Admission:  . UTI (lower urinary tract infection) . Hyperlipidemia . Essential hypertension  I have reviewed the medical record, interviewed the patient and family, and examined the patient. The following aspects are pertinent.  Past Medical History  Diagnosis Date  . Hypertension   . Stroke ([place])   . Acid reflux   . Atrial fibrillation (HCC)     tachybradycardia syndrome  . Tobacco abuse 04/30/2011  . Cardiomyopathy 05/02/2011    EF 45-50%, per Echo.  . 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 Carotid artery stenosis 05/02/2011  . CAD (coronary artery disease) 07/20/2006    R/S MV - mild perfusion defect in apical region consistent w/ infarct/scar vs apical thinning; no scintigraphic evidence of inducible myocardial ischemia; baseline EKG demonstrates A Flutter w/ variable AV conduction  . Hyperlipidemia   . Chest pain   . Cerebral atherosclerosis 12/11/2011    Carotid duplex - R ICA 0-49% diameter reduction, velocities suggest high end; L ICA 0-49% reduction, velocities suggest mid range  . Neuropathy ([place])   . Syncope   . Chronic anticoagulation 11/11/2013  . Orthostatic hypotension 11/11/2013  . Cancer [facility])     facial   Social History   Social  History  . Marital Status: Married    Spouse Name: N/A  . Number of Children: N/A  . Years of Education: N/A   Social History Main Topics  . Smoking status: Current Every Day Smoker -- 0.50 packs/day for 40 years    Types: Cigarettes  . Smokeless tobacco: Never Used  . Alcohol Use: No  . Drug Use: No  . Sexual Activity: Not Currently   Other Topics Concern  . None   Social History Narrative   Family History  Problem Relation Age of Onset  . Adopted: Yes   Scheduled Meds: . antiseptic oral rinse  7 mL Mouth Rinse BID  . aspirin EC  81 mg Oral Daily  .  atorvastatin  10 mg Oral QHS  . cefTRIAXone (ROCEPHIN) IVPB 1 gram/50 mL D5W  1 g Intravenous Q24H  . clopidogrel  75 mg Oral Daily  . feeding supplement (ENSURE ENLIVE)  237 mL Oral BID BM  . heparin  5,000 Units Subcutaneous 3 times per day  . metoprolol tartrate  12.5 mg Oral Daily  . pantoprazole  80 mg Oral Q1200  . polyethylene glycol  17 g Oral Daily  . prazosin  2 mg Oral BID  . senna  2 tablet Oral Daily  . sertraline  50 mg Oral Daily  . sodium chloride flush  3 mL Intravenous Q12H  . sodium chloride flush  3 mL Intravenous Q12H   Continuous Infusions: . sodium chloride 75 mL/hr at 03/22/15 0626   PRN Meds:.sodium chloride, acetaminophen **OR** acetaminophen, ondansetron **OR** ondansetron (ZOFRAN) IV, sodium chloride flush, temazepam Medications Prior to Admission:  Prior to Admission medications   Medication Sig Start Date End Date Taking? Authorizing Provider  aspirin EC 81 MG tablet Take 1 tablet (81 mg total) by mouth daily. 12/17/14  Yes Orvan Falconer, MD  atorvastatin (LIPITOR) 10 MG tablet take 1 tablet by mouth at bedtime 05/21/14  Yes Lorretta Harp, MD  CALCIUM-VITAMIN D PO Take 1 tablet by mouth daily.   Yes Historical Provider, MD  clopidogrel (PLAVIX) 75 MG tablet Take 1 tablet (75 mg total) by mouth daily. 12/17/14  Yes Orvan Falconer, MD  clotrimazole-betamethasone (LOTRISONE) cream Apply 1 application  topically daily as needed (for irritation).  02/10/14  Yes Historical Provider, MD  docusate sodium 100 MG CAPS Take 100 mg by mouth 2 (two) times daily. Patient taking differently: Take 100 mg by mouth daily as needed (for constipation).  11/11/13  Yes Isaiah Serge, NP  esomeprazole (NEXIUM) 20 MG capsule Take 20 mg by mouth daily before breakfast.   Yes Historical Provider, MD  estazolam (PROSOM) 2 MG tablet Take 2 mg by mouth at bedtime.   Yes Historical Provider, MD  metoprolol tartrate (LOPRESSOR) 25 MG tablet Take 12.5 mg by mouth daily.   Yes Historical Provider, MD  MYRBETRIQ 25 MG TB24 Take 50 mg by mouth daily.  06/27/12  Yes Historical Provider, MD  polyethylene glycol (MIRALAX / GLYCOLAX) packet Take 17 g by mouth daily. Patient taking differently: Take 17 g by mouth daily as needed.  11/11/13  Yes Isaiah Serge, NP  prazosin (MINIPRESS) 2 MG capsule Take 2 mg by mouth 2 (two) times daily.    Yes Historical Provider, MD  sertraline (ZOLOFT) 50 MG tablet Take 50 mg by mouth daily. 11/17/14  Yes Historical Provider, MD  trimethoprim (TRIMPEX) 100 MG tablet Take 100 mg by mouth daily.  01/07/13  Yes Historical Provider, MD  NITROSTAT 0.4 MG SL tablet Place 1 tablet (0.4 mg total) under the tongue every 5 (five) minutes as needed for chest pain. 05/13/14   Lorretta Harp, MD   Allergies  Allergen Reactions  . Ciprofloxacin Anaphylaxis and Rash  . Codeine   . Contrast Media [Iodinated Diagnostic Agents]     Per pt chart.  . Iodine   . Lovenox [Enoxaparin] Other (See Comments)    Unknown reaction. Pt can tolerate sq Heparin per previous admission  . Nitrofurantoin Other (See Comments)    Numbness in arms and legs  . Nitrofurantoin Monohyd Macro Other (See Comments)  . Penicillins Rash  . Sulfa Antibiotics Rash  . Sulfonamide Derivatives Rash    Review of Systems  Unable to perform ROS: Dementia    Physical Exam  Nursing note and vitals reviewed. Constitutional:  Frail  elderly  HENT:  Head: Normocephalic and atraumatic.  Cardiovascular: Normal rate and regular rhythm.   Respiratory: Effort normal. No respiratory distress.  GI: Soft. There is no tenderness. There is no guarding.  Neurological: She is alert.  Skin: Skin is warm and dry.  Psychiatric:  Calm, unable to find words at times.     Vital Signs: BP 159/100 mmHg  Pulse 88  Temp(Src) 98.9 F (37.2 C) (Oral)  Resp 23  Ht 5\' 2"  (1.575 m)  Wt 44.6 kg (98 lb 5.2 oz)  BMI 17.98 kg/m2  SpO2 95%  SpO2: SpO2: 95 % O2 Device:SpO2: 95 % O2 Flow Rate: .   IO: Intake/output summary:  Intake/Output Summary (Last 24 hours) at 03/22/15 1226 Last data filed at 03/22/15 1001  Gross per 24 hour  Intake    600 ml  Output    550 ml  Net     50 ml    LBM: Last BM Date: 03/19/15 Baseline Weight: Weight: 40.824 kg (90 lb) Most recent weight: Weight: 44.6 kg (98 lb 5.2 oz)      Palliative Assessment/Data:  Flowsheet Rows        Most Recent Value   Intake Tab    Referral Department  Hospitalist   Unit at Time of Referral  Cardiac/Telemetry Unit   Palliative Care Primary Diagnosis  Other (Comment)   Date Notified  03/21/15   Palliative Care Type  New Palliative care   Reason for referral  Clarify Goals of Care, Psychosocial or Spiritual support, Advance Care Planning   Date of Admission  03/19/15   Date first seen by Palliative Care  03/22/15   # of days Palliative referral response time  1 Day(s)   # of days IP prior to Palliative referral  2   Clinical Assessment    Palliative Performance Scale Score  40%   Pain Max last 24 hours  Not able to report   Pain Min Last 24 hours  Not able to report   Dyspnea Max Last 24 Hours  Not able to report   Dyspnea Min Last 24 hours  Not able to report   Psychosocial & Spiritual Assessment    Social Work Plan of Care  Staff support   Palliative Care Outcomes    Patient/Family meeting held?  Yes   Who was at the meeting?  patient and husband [place] goals of care, Provided psychosocial or spiritual support   Patient/Family wishes: Interventions discontinued/not started   Mechanical Ventilation   Palliative Care follow-up planned  Yes, Facility      Additional Data Reviewed:  CBC:    Component Value Date/Time   WBC 9.0 03/20/2015 0539   HGB 13.2 03/20/2015 0539   HCT 39.3 03/20/2015 0539   PLT 222 03/20/2015 0539   MCV 90.6 03/20/2015 0539   NEUTROABS 5.7 03/19/2015 1531   LYMPHSABS 1.9 03/19/2015 1531   MONOABS 0.7 03/19/2015 1531   EOSABS 0.2 03/19/2015 1531   BASOSABS 0.0 03/19/2015 1531   Comprehensive Metabolic Panel:    Component Value Date/Time   NA 142 03/21/2015 0721   K 4.5 03/21/2015 0721   CL 108 03/21/2015 0721   CO2 23 03/21/2015 0721   BUN 13 03/21/2015 0721   CREATININE 0.51 03/21/2015 0721   GLUCOSE 100* 03/21/2015 0721   CALCIUM 8.8* 03/21/2015  0721   AST 24 03/20/2015 0539   ALT 15 03/20/2015 0539   ALKPHOS 66 03/20/2015 0539   BILITOT 1.4* 03/20/2015 0539   PROT 6.5 03/20/2015 0539   ALBUMIN 3.7 03/20/2015 0539     Time In: 0950 Time Out: 1110 Time Total: 80 minutes  GOC discussion shared with nursing staff, CM, SW, and Dr. Jerilee Hoh.  Greater than 50%  of this time was spent counseling and coordinating care related to the above assessment and plan.  Signed by: Drue Novel, NP  Drue Novel, NP  03/22/2015, 12:26 PM  Please contact Palliative Medicine Team phone at [phone] for questions and concerns.

## 2015-03-23 DIAGNOSIS — R413 Other amnesia: Secondary | ICD-10-CM

## 2015-03-23 DIAGNOSIS — E43 Unspecified severe protein-calorie malnutrition: Secondary | ICD-10-CM | POA: Insufficient documentation

## 2015-03-23 LAB — BASIC METABOLIC PANEL
ANION GAP: 10 (ref 5–15)
BUN: 11 mg/dL (ref 6–20)
CALCIUM: 8.4 mg/dL — AB (ref 8.9–10.3)
CO2: 22 mmol/L (ref 22–32)
Chloride: 106 mmol/L (ref 101–111)
Creatinine, Ser: 0.45 mg/dL (ref 0.44–1.00)
GFR calc non Af Amer: >60 mL/min (ref >60)
Glucose, Bld: 87 mg/dL (ref 65–99)
Potassium: 3.4 mmol/L — ABNORMAL LOW (ref 3.5–5.1)
Sodium: 138 mmol/L (ref 135–145)

## 2015-03-23 LAB — URINE CULTURE
Culture: >=100000
Special Requests: NORMAL

## 2015-03-23 MED ORDER — CEPHALEXIN 500 MG PO CAPS: 500.0000 mg | ORAL_CAPSULE | Freq: Three times a day (TID) | ORAL | Status: AC

## 2015-03-23 NOTE — Clinical Social Work Note (Signed)
Clinical Social Work Assessment  Patient Details  Name: Andrea [name] MRN: [id number] Date of Birth: [birth year]  Date of referral:  03/23/15               Reason for consult:  Facility Placement                Permission sought to share information with:    Permission granted to share information::     Name::        Agency::     Relationship::     Contact Information:     Housing/Transportation Living arrangements for the past 2 months:  Single Family Home Source of Information:  Spouse, Adult Children Patient Interpreter Needed:  None Criminal Activity/Legal Involvement Pertinent to Current Situation/Hospitalization:  No - Comment as needed Significant Relationships:  Adult Children, Spouse Lives with:  Spouse Do you feel safe going back to the place where you live?  Yes Need for family participation in patient care:  Yes (Comment)  Care giving concerns: Patient's husband has been assisting with care giving.    Social Worker assessment / plan:  CSW spoke with patient's daugther who advised that her first choice would be [facility]. She advised CSW to speak with patient's spouse for additional facilities. She identified that patient uses a rolling walker, completes ADL's independently and has hand rails throughout the home. She stated that patient is able to walk in to her bathroom and perform routine hygiene.  CSW spoke with patient's husband. [name] advised that he preferred [facility] or [facility].     Employment status:  Retired Forensic scientist:  Medicare PT Recommendations:  [place] / Referral to community resources:  [place]  Patient/Family's Response to care:  Family is agreeable for patient to go to SNF.   Patient/Family's Understanding of and Emotional Response to Diagnosis, Current Treatment, and Prognosis: Family understands patient current treatment, diagnosis and prognosis.   Emotional  Assessment Appearance:  Appears stated age Attitude/Demeanor/Rapport:  Unable to Assess Affect (typically observed):  Unable to Assess Orientation:  Oriented to Self Alcohol / Substance use:  Not Applicable Psych involvement (Current and /or in the community):  No (Comment)  Discharge Needs  Concerns to be addressed:  Discharge Planning Concerns Readmission within the last 30 days:  Yes Current discharge risk:  None Barriers to Discharge:  No Barriers Identified   Ihor Gully, LCSW 03/23/2015, 1:00 PM

## 2015-03-23 NOTE — Clinical Social Work Placement (Signed)
   CLINICAL SOCIAL WORK PLACEMENT  NOTE  Date:  03/23/2015  Patient Details  Name: Andrea [name] MRN: [id number] Date of Birth: 1935/08/07  Clinical Social Work is seeking post-discharge placement for this patient at the [place] level of care (*CSW will initial, date and re-position this form in  chart as items are completed):  Yes   Patient/family provided with [place] Work Department's list of facilities offering this level of care within the geographic area requested by the patient (or if unable, by the patient's family).  Yes   Patient/family informed of their freedom to choose among providers that offer the needed level of care, that participate in Medicare, Medicaid or managed care program needed by the patient, have an available bed and are willing to accept the patient.  Yes   Patient/family informed of [facility]'s ownership interest in [facility] and [facility], as well as of the fact that they are under no obligation to receive care at these facilities.  PASRR submitted to EDS on       PASRR number received on       Existing PASRR number confirmed on 03/23/15     FL2 transmitted to all facilities in geographic area requested by pt/family on       FL2 transmitted to all facilities within larger geographic area on 03/23/15     Patient informed that his/her managed care company has contracts with or will negotiate with certain facilities, including the following:            Patient/family informed of bed offers received.  Patient chooses bed at       Physician recommends and patient chooses bed at      Patient to be transferred to   on  .  Patient to be transferred to facility by       Patient family notified on   of transfer.  Name of family member notified:        PHYSICIAN       Additional Comment:    _______________________________________________ Ihor Gully, LCSW 03/23/2015, 12:57 PM

## 2015-03-23 NOTE — Clinical Social Work Placement (Signed)
   CLINICAL SOCIAL WORK PLACEMENT  NOTE  Date:  03/23/2015  Patient Details  Name: Andrea [name] MRN: [id number] Date of Birth: Aug 02, 1935  Clinical Social Work is seeking post-discharge placement for this patient at the [place] level of care (*CSW will initial, date and re-position this form in  chart as items are completed):  Yes   Patient/family provided with [facility] Work Department's list of facilities offering this level of care within the geographic area requested by the patient (or if unable, by the patient's family).  Yes   Patient/family informed of their freedom to choose among providers that offer the needed level of care, that participate in Medicare, Medicaid or managed care program needed by the patient, have an available bed and are willing to accept the patient.  Yes   Patient/family informed of [facility]'s ownership interest in [facility] and [facility], as well as of the fact that they are under no obligation to receive care at these facilities.  PASRR submitted to EDS on       PASRR number received on       Existing PASRR number confirmed on 03/23/15     FL2 transmitted to all facilities in geographic area requested by pt/family on       FL2 transmitted to all facilities within larger geographic area on 03/23/15     Patient informed that his/her managed care company has contracts with or will negotiate with certain facilities, including the following:        Yes   Patient/family informed of bed offers received.  Patient chooses bed at [facility]     Physician recommends and patient chooses bed at      Patient to be transferred to [facility] on 03/23/15.  Patient to be transferred to facility by  [facility])     Patient family notified on 03/23/15 of transfer.  Name of family member notified:  spouse and daughter     PHYSICIAN       Additional Comment:     _______________________________________________ Ihor Gully, LCSW 03/23/2015, 2:56 PM

## 2015-03-23 NOTE — Progress Notes (Signed)
Daily Progress Note   Patient Name: Andrea [name]       Date: 03/23/2015 DOB: [birth year]  Age: 80 y.o. MRN#: [id number] Attending Physician: Mikki Harbor* Primary Care Physician: Collene Mares, PA-C Admit Date: 03/19/2015  Reason for Consultation/Follow-up: Disposition, Establishing goals of care and Psychosocial/spiritual support  Subjective: [name] is resting when I enter.  Her husband Gershon Mussel is at bedside.  [name] does not wake as we talk.  Mr. Mcleary shares that he has talked with her daughter and they are in agreement that she will need to be placed in long term care.  Mr. Spar shares that she has not taken her am medications, he tells me that she will take them easily at home.  We talk about what to expect the first few weeks at LTC (increased confusion, refusing meds), and some ideas on how to calm her.  He states that his daughter has been helpful in explaining what may happen.  I encourage him to call me at any time.  Call to daughter Mady Haagensen in [place].  Left VM.   Length of Stay: 3 days  Current Medications: Scheduled Meds:  . antiseptic oral rinse  7 mL Mouth Rinse BID  . aspirin EC  81 mg Oral Daily  . atorvastatin  10 mg Oral QHS  . cefTRIAXone (ROCEPHIN) IVPB 1 gram/50 mL D5W  1 g Intravenous Q24H  . clopidogrel  75 mg Oral Daily  . feeding supplement (ENSURE ENLIVE)  237 mL Oral BID BM  . heparin  5,000 Units Subcutaneous 3 times per day  . metoprolol tartrate  12.5 mg Oral Daily  . pantoprazole  80 mg Oral Q1200  . polyethylene glycol  17 g Oral Daily  . prazosin  2 mg Oral BID  . senna-docusate  1 tablet Oral BID  . sertraline  50 mg Oral Daily  . sodium chloride flush  3 mL Intravenous Q12H  . sodium chloride flush  3 mL  Intravenous Q12H    Continuous Infusions: . sodium chloride 75 mL/hr at 03/22/15 1804    PRN Meds: sodium chloride, acetaminophen **OR** acetaminophen, bisacodyl, ondansetron **OR** ondansetron (ZOFRAN) IV, sodium chloride flush, temazepam  Physical Exam: Physical Exam  Constitutional:  Frail, thin, elderly  HENT:  Head: Normocephalic and atraumatic.  Cardiovascular: Normal  rate.   Pulmonary/Chest: Effort normal. No respiratory distress.  Abdominal: Soft. There is no guarding.  Skin: Skin is warm and dry.  Nursing note and vitals reviewed.               Vital Signs: BP 138/101 mmHg  Pulse 110  Temp(Src) 99 F (37.2 C) (Oral)  Resp 20  Ht 5\' 2"  (1.575 m)  Wt 44.6 kg (98 lb 5.2 oz)  BMI 17.98 kg/m2  SpO2 96% SpO2: SpO2: 96 % O2 Device: O2 Device: Not Delivered O2 Flow Rate:    Intake/output summary:  Intake/Output Summary (Last 24 hours) at 03/23/15 1301 Last data filed at 03/23/15 [id number]  Gross per 24 hour  Intake 6552.5 ml  Output    950 ml  Net 5602.5 ml   LBM: Last BM Date: 03/19/15 Baseline Weight: Weight: 40.824 kg (90 lb) Most recent weight: Weight: 44.6 kg (98 lb 5.2 oz)       Palliative Assessment/Data: Flowsheet Rows        Most Recent Value   Intake Tab    Referral Department  Hospitalist   Unit at Time of Referral  Cardiac/Telemetry Unit   Palliative Care Primary Diagnosis  Other (Comment)   Date Notified  03/21/15   Palliative Care Type  New Palliative care   Reason for referral  Clarify Goals of Care, Psychosocial or Spiritual support, Advance Care Planning   Date of Admission  03/19/15   Date first seen by Palliative Care  03/22/15   # of days Palliative referral response time  1 Day(s)   # of days IP prior to Palliative referral  2   Clinical Assessment    Palliative Performance Scale Score  40%   Pain Max last 24 hours  Not able to report   Pain Min Last 24 hours  Not able to report   Dyspnea Max Last 24 Hours  Not able to report    Dyspnea Min Last 24 hours  Not able to report   Psychosocial & Spiritual Assessment    Social Work Plan of Care  Staff support   Palliative Care Outcomes    Patient/Family meeting held?  Yes   Who was at the meeting?  patient and husband Tom   Palliative Care Outcomes  Clarified goals of care, Provided psychosocial or spiritual support   Patient/Family wishes: Interventions discontinued/not started   Mechanical Ventilation   Palliative Care follow-up planned  Yes, Facility      Additional Data Reviewed: CBC    Component Value Date/Time   WBC 9.0 03/20/2015 0539   RBC 4.34 03/20/2015 0539   HGB 13.2 03/20/2015 0539   HCT 39.3 03/20/2015 0539   PLT 222 03/20/2015 0539   MCV 90.6 03/20/2015 0539   MCH 30.4 03/20/2015 0539   MCHC 33.6 03/20/2015 0539   RDW 13.8 03/20/2015 0539   LYMPHSABS 1.9 03/19/2015 1531   MONOABS 0.7 03/19/2015 1531   EOSABS 0.2 03/19/2015 1531   BASOSABS 0.0 03/19/2015 1531    CMP     Component Value Date/Time   NA 138 03/23/2015 0515   K 3.4* 03/23/2015 0515   CL 106 03/23/2015 0515   CO2 22 03/23/2015 0515   GLUCOSE 87 03/23/2015 0515   BUN 11 03/23/2015 0515   CREATININE 0.45 03/23/2015 0515   CALCIUM 8.4* 03/23/2015 0515   PROT 6.5 03/20/2015 0539   ALBUMIN 3.7 03/20/2015 0539   AST 24 03/20/2015 0539   ALT 15 03/20/2015 0539   ALKPHOS  66 03/20/2015 0539   BILITOT 1.4* 03/20/2015 0539   GFRNONAA >60 03/23/2015 0515   GFRAA >60 03/23/2015 0515       Problem List:  Patient Active Problem List   Diagnosis Date Noted  . Protein-calorie malnutrition, severe 03/23/2015  . Palliative care encounter   . DNR (do not resuscitate) discussion   . Memory loss 03/19/2015  . Physical deconditioning 03/19/2015  . Protein calorie malnutrition ([place]) 03/19/2015  . Chronic a-fib ([place]) 03/19/2015  . History of CVA (cerebrovascular accident) 03/19/2015  . SBO (small bowel obstruction) ([place]) 12/14/2014  . Hypotension 12/14/2014  . Small bowel  obstruction ([place]) 12/14/2014  . Chronic anticoagulation 11/11/2013  . Orthostatic hypotension 11/11/2013  . Tachy-brady syndrome ([place]) 11/07/2013  . Rash and nonspecific skin eruption 11/06/2013  . Syncope 10/04/2012  . Chest pain 07/08/2012  . Hyperlipidemia 07/08/2012  . Cardiomyopathy 05/02/2011  . Carotid artery stenosis 05/02/2011  . Essential hypertension 04/30/2011  . Encephalopathy 04/30/2011  . Tobacco abuse 04/30/2011  . Aphasia 04/29/2011  . Supratherapeutic INR 04/29/2011  . Atrial fibrillation, permanent  04/29/2011  . UTI (lower urinary tract infection) 04/29/2011  . Dysarthria following cerebrovascular accident 10/18/2010  . ANKLE SPRAIN, LEFT 02/23/2010  . CONTUSION, LOWER LEG, LEFT 02/23/2010     Palliative Care Assessment & Plan    1.Code Status:  DNR    Code Status Orders        Start     Ordered   03/19/15 1905  Do not attempt resuscitation (DNR)   Continuous    Question Answer Comment  In the event of cardiac or respiratory ARREST Do not call a "code blue"   In the event of cardiac or respiratory ARREST Do not perform Intubation, CPR, defibrillation or ACLS   In the event of cardiac or respiratory ARREST Use medication by any route, position, wound care, and other measures to relive pain and suffering. May use oxygen, suction and manual treatment of airway obstruction as needed for comfort.      03/19/15 1907    Code Status History    Date Active Date Inactive Code Status Order ID Comments User Context   12/14/2014  4:55 PM 12/17/2014  6:25 PM Full Code [id number]  Doree Albee, MD ED   11/05/2013  9:07 PM 11/11/2013  8:49 PM Full Code [id number]  Doree Albee, MD ED   10/04/2012  6:28 AM 10/05/2012  2:54 PM Full Code [id number]  Bonnielee Haff, MD Inpatient   04/29/2011  5:37 PM 05/02/2011  6:11 PM DNR [id number]  Robbie Lis, MD ED   04/29/2011  5:30 PM 04/29/2011  5:37 PM Full Code [id number]  Robbie Lis, MD ED       2. Goals of Care/Additional  Recommendations:  LTC, treat the treatable.  We discuss likelihood of returning for same issues.   Limitations on Scope of Treatment: Treat the treatable.   Desire for further Chaplaincy support:no  Psycho-social Needs: Education on Hospice  3. Symptom Management:      1.per hospitalist  4. Palliative Prophylaxis:   Aspiration, Frequent Pain Assessment, Oral Care and Turn Reposition  5. Prognosis: Unable to determine, based on outcomes.  6. Discharge Planning:  Long term care as private pay.    Care plan was discussed with nursing staff, CM, SW, and Dr. Jerilee Hoh.   Thank you for allowing the Palliative Medicine Team to assist in the care of this patient.   Time In: 1050 Time Out: 1125 Total Time  35 minutes Prolonged Time Billed  no         Drue Novel, NP  03/23/2015, 1:01 PM  Please contact Palliative Medicine Team phone at [phone] for questions and concerns.

## 2015-03-23 NOTE — Discharge Summary (Signed)
Physician Discharge Summary  Andrea [name] [id number] DOB: 1935-08-17 DOA: 03/19/2015  PCP: Collene Mares, PA-C  Admit date: 03/19/2015 Discharge date: 03/23/2015  Time spent: 45 minutes  Recommendations for Outpatient Follow-up:  -We'll be discharged to skilled nursing facility today. -Has 3 days of Keflex remaining for UTI. -Palliative care to follow at SNF.   Discharge Diagnoses:  Active Problems:   UTI (lower urinary tract infection)   Essential hypertension   Hyperlipidemia   Memory loss   Physical deconditioning   Protein calorie malnutrition ([place])   Chronic a-fib ([facility])   History of CVA (cerebrovascular accident)   Palliative care encounter   DNR (do not resuscitate) discussion   Protein-calorie malnutrition, severe   Discharge Condition: Stable  Filed Weights   03/19/15 1419 03/19/15 2001  Weight: 40.824 kg (90 lb) 44.6 kg (98 lb 5.2 oz)    History of present illness:  As per Dr. Marily Memos on 1/27: Andrea [name] is a 80 y.o. female  Level 5 caveat: pt unable to provide reliable history due to baseline confusion. History provided by pt and husband and EDP. Pt w/ gadual physical and mental decline over the past few months. Multiple recent falls. Husband unable to physically care for patient any longer. Patient was being treated with Trimpex for chronic UTIs but ran out of this medication in December. Patient is without complaint at this time but has eaten very little over the last 2-3 days which is unusual for her. Denies any chest pain, shortness breath, palpitations, dysuria, frequency, abdominal pain, fevers, nausea, vomiting. Symptoms are constant.   Patient at baseline smokes 1 pack per day but over the last couple days she has reduced her smoking to about one half pack per day.  Hospital Course:   Escherichia coli and Klebsiella UTI -We'll transition over to Keflex to complete 3 more days of treatment.  Hypokalemia -Replaced  orally.  Hypomagnesemia -Will replete.  Generalized weakness/adult failure to thrive -TSH/B 12 within normal limits. -PT/OT recs SNF. -We'll also request palliative care consultation given her comorbidities and poor long-term prognosis. They will follow at SNF.  Atrial fibrillation -Rate controlled, not on anticoagulation due to fall risk  Hypertension -Well-controlled.  Procedures:  None   Consultations:  Palliative care  Discharge Instructions  Discharge Instructions    Diet - low sodium heart healthy    Complete by:  As directed      Increase activity slowly    Complete by:  As directed             Medication List    STOP taking these medications        trimethoprim 100 MG tablet  Commonly known as:  TRIMPEX      TAKE these medications        aspirin EC 81 MG tablet  Take 1 tablet (81 mg total) by mouth daily.     atorvastatin 10 MG tablet  Commonly known as:  LIPITOR  take 1 tablet by mouth at bedtime     CALCIUM-VITAMIN D PO  Take 1 tablet by mouth daily.     cephALEXin 500 MG capsule  Commonly known as:  KEFLEX  Take 1 capsule (500 mg total) by mouth 3 (three) times daily.     clopidogrel 75 MG tablet  Commonly known as:  PLAVIX  Take 1 tablet (75 mg total) by mouth daily.     clotrimazole-betamethasone cream  Commonly known as:  LOTRISONE  Apply 1 application topically  daily as needed (for irritation).     DSS 100 MG Caps  Take 100 mg by mouth 2 (two) times daily.     esomeprazole 20 MG capsule  Commonly known as:  NEXIUM  Take 20 mg by mouth daily before breakfast.     estazolam 2 MG tablet  Commonly known as:  PROSOM  Take 2 mg by mouth at bedtime.     metoprolol tartrate 25 MG tablet  Commonly known as:  LOPRESSOR  Take 12.5 mg by mouth daily.     MYRBETRIQ 25 MG Tb24 tablet  Generic drug:  mirabegron ER  Take 50 mg by mouth daily.     NITROSTAT 0.4 MG SL tablet  Generic drug:  nitroGLYCERIN  Place 1 tablet (0.4 mg  total) under the tongue every 5 (five) minutes as needed for chest pain.     polyethylene glycol packet  Commonly known as:  MIRALAX / GLYCOLAX  Take 17 g by mouth daily.     prazosin 2 MG capsule  Commonly known as:  MINIPRESS  Take 2 mg by mouth 2 (two) times daily.     sertraline 50 MG tablet  Commonly known as:  ZOLOFT  Take 50 mg by mouth daily.       Allergies  Allergen Reactions  . Ciprofloxacin Anaphylaxis and Rash  . Codeine   . Contrast Media [Iodinated Diagnostic Agents]     Per pt chart.  . Iodine   . Lovenox [Enoxaparin] Other (See Comments)    Unknown reaction. Pt can tolerate sq Heparin per previous admission  . Nitrofurantoin Other (See Comments)    Numbness in arms and legs  . Nitrofurantoin Monohyd Macro Other (See Comments)  . Penicillins Rash  . Sulfa Antibiotics Rash  . Sulfonamide Derivatives Rash      The results of significant diagnostics from this hospitalization (including imaging, microbiology, ancillary and laboratory) are listed below for reference.    Significant Diagnostic Studies: Dg Chest 1 View  03/19/2015  CLINICAL DATA:  Fall. EXAM: CHEST 1 VIEW COMPARISON:  12/14/2014. FINDINGS: Lungs are hyperexpanded with asymmetric elevation of the left hemidiaphragm. The cardio pericardial silhouette is enlarged. The lungs are clear wiithout focal pneumonia, edema, pneumothorax or pleural effusion. Interstitial markings are diffusely coarsened with chronic features. 15 mm nodular density seen in the region of the left hilum. Telemetry leads overlie the chest. Bones are diffusely demineralized. IMPRESSION: 15 mm left hilar nodule may be related to lymphadenopathy or central lesion. CT chest with contrast recommended to further evaluate. Hyperexpansion with chronic underlying interstitial lung disease. Electronically Signed   By: Misty Stanley M.D.   On: 03/19/2015 17:35   Ct Knee Left Wo Contrast  03/19/2015  CLINICAL DATA:  Recent fall with laceration  EXAM: CT OF THE left KNEE WITHOUT CONTRAST TECHNIQUE: Multidetector CT imaging of the left knee was performed according to the standard protocol. Multiplanar CT image reconstructions were also generated. COMPARISON:  None. FINDINGS: There are changes consistent with prior healed fracture of the proximal fibula. No acute fracture or dislocation is identified. Mild osteopenia is noted. No significant joint effusion is noted. The cruciate ligaments appear intact. No focal hematoma is noted. No radiopaque foreign body is seen. Diffuse vascular calcifications are noted. IMPRESSION: No acute abnormality is seen. Electronically Signed   By: Inez Catalina M.D.   On: 03/19/2015 17:44    Microbiology: Recent Results (from the past 240 hour(s))  Urine culture     Status: None  Collection Time: 03/19/15  4:23 PM  Result Value Ref Range Status   Specimen Description URINE, CATHETERIZED  Final   Special Requests Normal  Final   Culture   Final    >=100,000 COLONIES/mL KLEBSIELLA PNEUMONIAE 50,000 COLONIES/mL ESCHERICHIA COLI Performed at [facility]    Report Status 03/23/2015 FINAL  Final   Organism ID, Bacteria KLEBSIELLA PNEUMONIAE  Final   Organism ID, Bacteria ESCHERICHIA COLI  Final      Susceptibility   Escherichia coli - MIC*    AMPICILLIN >=32 RESISTANT Resistant     CEFAZOLIN 8 SENSITIVE Sensitive     CEFTRIAXONE <=1 SENSITIVE Sensitive     CIPROFLOXACIN <=0.25 SENSITIVE Sensitive     GENTAMICIN <=1 SENSITIVE Sensitive     IMIPENEM 0.5 SENSITIVE Sensitive     NITROFURANTOIN <=16 SENSITIVE Sensitive     TRIMETH/SULFA >=320 RESISTANT Resistant     AMPICILLIN/SULBACTAM >=32 RESISTANT Resistant     PIP/TAZO <=4 SENSITIVE Sensitive     * 50,000 COLONIES/mL ESCHERICHIA COLI   Klebsiella pneumoniae - MIC*    AMPICILLIN >=32 RESISTANT Resistant     CEFAZOLIN <=4 SENSITIVE Sensitive     CEFTRIAXONE <=1 SENSITIVE Sensitive     CIPROFLOXACIN <=0.25 SENSITIVE Sensitive     GENTAMICIN <=1  SENSITIVE Sensitive     IMIPENEM <=0.25 SENSITIVE Sensitive     NITROFURANTOIN 32 SENSITIVE Sensitive     TRIMETH/SULFA >=320 RESISTANT Resistant     AMPICILLIN/SULBACTAM >=32 RESISTANT Resistant     PIP/TAZO 8 SENSITIVE Sensitive     * >=100,000 COLONIES/mL KLEBSIELLA PNEUMONIAE  Culture, blood (routine x 2)     Status: None (Preliminary result)   Collection Time: 03/19/15  8:25 PM  Result Value Ref Range Status   Specimen Description BLOOD RIGHT ARM  Final   Special Requests BOTTLES DRAWN AEROBIC AND ANAEROBIC 5CC EACH  Final   Culture NO GROWTH 4 DAYS  Final   Report Status PENDING  Incomplete  Culture, blood (routine x 2)     Status: None (Preliminary result)   Collection Time: 03/19/15  8:29 PM  Result Value Ref Range Status   Specimen Description BLOOD RIGHT HAND  Final   Special Requests BOTTLES DRAWN AEROBIC AND ANAEROBIC [facility] EACH  Final   Culture NO GROWTH 4 DAYS  Final   Report Status PENDING  Incomplete     Labs: Basic Metabolic Panel:  Recent Labs Lab 03/19/15 1531 03/20/15 0539 03/21/15 0721 03/22/15 1227 03/23/15 0515  NA 140 140 142  --  138  K 3.0* 2.9* 4.5  --  3.4*  CL 102 102 108  --  106  CO2 29 25 23   --  22  GLUCOSE 109* 102* 100*  --  87  BUN 10 11 13   --  11  CREATININE 0.46 0.51 0.51  --  0.45  CALCIUM 8.9 8.6* 8.8*  --  8.4*  MG  --  1.2*  --  1.5*  --    Liver Function Tests:  Recent Labs Lab 03/19/15 1531 03/20/15 0539  AST 24 24  ALT 15 15  ALKPHOS 66 66  BILITOT 1.0 1.4*  PROT 6.4* 6.5  ALBUMIN 3.7 3.7   No results for input(s): LIPASE, AMYLASE in the last 168 hours. No results for input(s): AMMONIA in the last 168 hours. CBC:  Recent Labs Lab 03/19/15 1531 03/20/15 0539  WBC 8.4 9.0  NEUTROABS 5.7  --   HGB 13.1 13.2  HCT 38.6 39.3  MCV 90.4  90.6  PLT 213 222   Cardiac Enzymes: No results for input(s): CKTOTAL, CKMB, CKMBINDEX, TROPONINI in the last 168 hours. BNP: BNP (last 3 results)  Recent Labs   03/19/15 1531  BNP 302.0*    ProBNP (last 3 results) No results for input(s): PROBNP in the last 8760 hours.  CBG: No results for input(s): GLUCAP in the last 168 hours.     SignedLelon Frohlich  [facility] Pager: [phone] 03/23/2015, 11:11 AM     \

## 2015-03-23 NOTE — Care Management Important Message (Signed)
Important Message  Patient Details  Name: Andrea [name] MRN: [id number] Date of Birth: [birth year]   Medicare Important Message Given:  Yes    Sherald Barge, RN 03/23/2015, 2:33 PM

## 2015-03-23 NOTE — Progress Notes (Signed)
Report given to Hoy Register at [facility].

## 2015-03-23 NOTE — NC FL2 (Signed)
[place] LEVEL OF CARE SCREENING TOOL     IDENTIFICATION  Patient Name: Andrea [name] Birthdate: 16-Jan-1936 Sex: female Admission Date (Current Location): 03/19/2015  [facility] and [place] Number:  [employer] and Address:  [facility] [address] [place]      Provider Number: [id number]  Attending Physician Name and Address:  Koleen Nimrod Acost*  Relative Name and Phone Number:       Current Level of Care: Hospital Recommended Level of Care: [place] Prior Approval Number:    Date Approved/Denied:   PASRR Number:  ([id number])  Discharge Plan: SNF    Current Diagnoses: Patient Active Problem List   Diagnosis Date Noted  . Protein-calorie malnutrition, severe 03/23/2015  . Palliative care encounter   . DNR (do not resuscitate) discussion   . Memory loss 03/19/2015  . Physical deconditioning 03/19/2015  . Protein calorie malnutrition ([place]) 03/19/2015  . Chronic a-fib ([place]) 03/19/2015  . History of CVA (cerebrovascular accident) 03/19/2015  . SBO (small bowel obstruction) ([place]) 12/14/2014  . Hypotension 12/14/2014  . Small bowel obstruction ([place]) 12/14/2014  . Chronic anticoagulation 11/11/2013  . Orthostatic hypotension 11/11/2013  . Tachy-brady syndrome ([place]) 11/07/2013  . Rash and nonspecific skin eruption 11/06/2013  . Syncope 10/04/2012  . Chest pain 07/08/2012  . Hyperlipidemia 07/08/2012  . Cardiomyopathy 05/02/2011  . Carotid artery stenosis 05/02/2011  . Essential hypertension 04/30/2011  . Encephalopathy 04/30/2011  . Tobacco abuse 04/30/2011  . Aphasia 04/29/2011  . Supratherapeutic INR 04/29/2011  . Atrial fibrillation, permanent  04/29/2011  . UTI (lower urinary tract infection) 04/29/2011  . Dysarthria following cerebrovascular accident 10/18/2010  . ANKLE SPRAIN, LEFT 02/23/2010  . CONTUSION, LOWER LEG, LEFT 02/23/2010    Orientation RESPIRATION BLADDER Height &  Weight     Self  Normal Continent Weight: 98 lb 5.2 oz (44.6 kg) Height:  5\' 2"  (157.5 cm)  BEHAVIORAL SYMPTOMS/MOOD NEUROLOGICAL BOWEL NUTRITION STATUS      Continent Diet (Heart Healthy. low sodium heart healthy. )  AMBULATORY STATUS COMMUNICATION OF NEEDS Skin   Limited Assist Verbally Skin abrasions (Laceration to left knee)                       Personal Care Assistance Level of Assistance  Bathing, Feeding, Dressing Bathing Assistance: Limited assistance Feeding assistance: Limited assistance Dressing Assistance: Limited assistance     Functional Limitations Info  Sight, Hearing, Speech Sight Info: Adequate Hearing Info: Adequate Speech Info: Adequate    SPECIAL CARE FACTORS FREQUENCY  PT (By licensed PT)     PT Frequency:  (5x/week)              Contractures      Additional Factors Info  Code Status, Allergies Code Status Info:  (DNR) Allergies Info: Ciproflaxacin, Codeine, Contrast Media, Iodine, Leovnox, Nitrofurantoin Monohyd Macro, Penicillins, Sulfa Antibiotics, Sulfonamide Derivatives.           Current Medications (03/23/2015):  This is the current hospital active medication list Current Facility-Administered Medications  Medication Dose Route Frequency Provider Last Rate Last Dose  . 0.9 %  sodium chloride infusion  250 mL Intravenous PRN Waldemar Dickens, MD      . 0.9 %  sodium chloride infusion   Intravenous Continuous Waldemar Dickens, MD 75 mL/hr at 03/22/15 1804    . acetaminophen (TYLENOL) tablet 650 mg  650 mg Oral Q6H PRN Waldemar Dickens, MD  Or  . acetaminophen (TYLENOL) suppository 650 mg  650 mg Rectal Q6H PRN Waldemar Dickens, MD      . antiseptic oral rinse (CPC / CETYLPYRIDINIUM CHLORIDE 0.05%) solution 7 mL  7 mL Mouth Rinse BID Waldemar Dickens, MD   7 mL at 03/22/15 2322  . aspirin EC tablet 81 mg  81 mg Oral Daily Waldemar Dickens, MD   81 mg at 03/23/15 1054  . atorvastatin (LIPITOR) tablet 10 mg  10 mg Oral QHS Waldemar Dickens, MD   10 mg at 03/22/15 2322  . bisacodyl (DULCOLAX) suppository 10 mg  10 mg Rectal Daily PRN Drue Novel, NP      . cefTRIAXone (ROCEPHIN) 1 g in dextrose 5 % 50 mL IVPB  1 g Intravenous Q24H Waldemar Dickens, MD   1 g at 03/22/15 1640  . clopidogrel (PLAVIX) tablet 75 mg  75 mg Oral Daily Waldemar Dickens, MD   75 mg at 03/23/15 1053  . feeding supplement (ENSURE ENLIVE) (ENSURE ENLIVE) liquid 237 mL  237 mL Oral BID BM Waldemar Dickens, MD   237 mL at 03/22/15 1306  . heparin injection 5,000 Units  5,000 Units Subcutaneous 3 times per day Waldemar Dickens, MD   5,000 Units at 03/23/15 0620  . metoprolol tartrate (LOPRESSOR) tablet 12.5 mg  12.5 mg Oral Daily Waldemar Dickens, MD   12.5 mg at 03/23/15 1053  . ondansetron (ZOFRAN) tablet 4 mg  4 mg Oral Q6H PRN Waldemar Dickens, MD       Or  . ondansetron [facility]) injection 4 mg  4 mg Intravenous Q6H PRN Waldemar Dickens, MD      . pantoprazole (PROTONIX) EC tablet 80 mg  80 mg Oral Q1200 Waldemar Dickens, MD   80 mg at 03/22/15 1304  . polyethylene glycol (MIRALAX / GLYCOLAX) packet 17 g  17 g Oral Daily Waldemar Dickens, MD   17 g at 03/23/15 1054  . prazosin (MINIPRESS) capsule 2 mg  2 mg Oral BID Waldemar Dickens, MD   2 mg at 03/22/15 2322  . senna-docusate (Senokot-S) tablet 1 tablet  1 tablet Oral BID Drue Novel, NP   1 tablet at 03/23/15 1054  . sertraline (ZOLOFT) tablet 50 mg  50 mg Oral Daily Waldemar Dickens, MD   50 mg at 03/23/15 1053  . sodium chloride flush (NS) 0.9 % injection 3 mL  3 mL Intravenous Q12H Waldemar Dickens, MD   3 mL at 03/22/15 [id number]  . sodium chloride flush (NS) 0.9 % injection 3 mL  3 mL Intravenous Q12H Waldemar Dickens, MD   3 mL at 03/22/15 0921  . sodium chloride flush (NS) 0.9 % injection 3 mL  3 mL Intravenous PRN Waldemar Dickens, MD      . temazepam (RESTORIL) capsule 15 mg  15 mg Oral QHS PRN Waldemar Dickens, MD   15 mg at 03/22/15 2321     Discharge Medications: Please see discharge summary for a list  of discharge medications.  Relevant Imaging Results:  Relevant Lab Results:   Additional Information    Kerrilyn Azbill, Clydene Pugh, LCSW

## 2015-03-23 NOTE — Care Management Note (Signed)
Case Management Note  Patient Details  Name: Andrea [name] MRN: [id number] Date of Birth: 10-05-1935  Subjective/Objective:                  Pt is from home, lives with husband and requires assist with ADL's. Pt has advanced dementia. Plan for DC to SNF. CSW is aware of DC plan and is working with family to find placement. Pt [place] today.   Action/Plan: No CM needs.   Expected Discharge Date:                  Expected Discharge Plan:  Skilled Nursing Facility  In-House Referral:  Clinical Social Work  Discharge planning Services  CM Consult  Post Acute Care Choice:  NA Choice offered to:  NA  DME Arranged:    DME Agency:     HH Arranged:    [place] Agency:     Status of Service:  Completed, signed off  Medicare Important Message Given:  Yes Date Medicare IM Given:    Medicare IM give by:    Date Additional Medicare IM Given:    Additional Medicare Important Message give by:     If discussed at [place] of Stay Meetings, dates discussed:    Additional Comments:  Sherald Barge, RN 03/23/2015, 2:34 PM

## 2015-03-24 LAB — CULTURE, BLOOD (ROUTINE X 2)
CULTURE: NO GROWTH
Culture: NO GROWTH

## 2015-07-22 DEATH — deceased
